# Patient Record
Sex: Female | Born: 1960 | Race: White | Hispanic: No | Marital: Married | Smoking: Current every day smoker
Health system: Southern US, Community
[De-identification: ages and names within clinical notes are randomized; demographics above are authoritative.]

## PROBLEM LIST (undated history)

## (undated) DIAGNOSIS — G4700 Insomnia, unspecified: Secondary | ICD-10-CM

## (undated) DIAGNOSIS — M8580 Other specified disorders of bone density and structure, unspecified site: Secondary | ICD-10-CM

## (undated) DIAGNOSIS — F329 Major depressive disorder, single episode, unspecified: Secondary | ICD-10-CM

## (undated) DIAGNOSIS — Q5210 Doubling of vagina, unspecified: Secondary | ICD-10-CM

## (undated) DIAGNOSIS — E079 Disorder of thyroid, unspecified: Secondary | ICD-10-CM

## (undated) DIAGNOSIS — I10 Essential (primary) hypertension: Secondary | ICD-10-CM

## (undated) DIAGNOSIS — F17200 Nicotine dependence, unspecified, uncomplicated: Secondary | ICD-10-CM

## (undated) DIAGNOSIS — F32A Depression, unspecified: Secondary | ICD-10-CM

## (undated) DIAGNOSIS — N644 Mastodynia: Secondary | ICD-10-CM

## (undated) DIAGNOSIS — N390 Urinary tract infection, site not specified: Secondary | ICD-10-CM

## (undated) DIAGNOSIS — K529 Noninfective gastroenteritis and colitis, unspecified: Secondary | ICD-10-CM

## (undated) DIAGNOSIS — D869 Sarcoidosis, unspecified: Secondary | ICD-10-CM

## (undated) DIAGNOSIS — F419 Anxiety disorder, unspecified: Secondary | ICD-10-CM

## (undated) DIAGNOSIS — Z8719 Personal history of other diseases of the digestive system: Secondary | ICD-10-CM

## (undated) DIAGNOSIS — K5792 Diverticulitis of intestine, part unspecified, without perforation or abscess without bleeding: Secondary | ICD-10-CM

## (undated) DIAGNOSIS — M1990 Unspecified osteoarthritis, unspecified site: Secondary | ICD-10-CM

## (undated) DIAGNOSIS — E785 Hyperlipidemia, unspecified: Secondary | ICD-10-CM

## (undated) HISTORY — DX: Other specified disorders of bone density and structure, unspecified site: M85.80

## (undated) HISTORY — DX: Unspecified osteoarthritis, unspecified site: M19.90

## (undated) HISTORY — DX: Major depressive disorder, single episode, unspecified: F32.9

## (undated) HISTORY — DX: Hyperlipidemia, unspecified: E78.5

## (undated) HISTORY — PX: TONSILLECTOMY: SUR1361

## (undated) HISTORY — DX: Noninfective gastroenteritis and colitis, unspecified: K52.9

## (undated) HISTORY — DX: Essential (primary) hypertension: I10

## (undated) HISTORY — DX: Depression, unspecified: F32.A

## (undated) HISTORY — DX: Doubling of vagina, unspecified: Q52.10

## (undated) HISTORY — DX: Diverticulitis of intestine, part unspecified, without perforation or abscess without bleeding: K57.92

## (undated) HISTORY — DX: Sarcoidosis, unspecified: D86.9

## (undated) HISTORY — DX: Insomnia, unspecified: G47.00

## (undated) HISTORY — DX: Anxiety disorder, unspecified: F41.9

## (undated) HISTORY — DX: Mastodynia: N64.4

## (undated) HISTORY — DX: Nicotine dependence, unspecified, uncomplicated: F17.200

## (undated) HISTORY — PX: OTHER SURGICAL HISTORY: SHX169

## (undated) SURGERY — LAPAROSCOPIC CHOLECYSTECTOMY WITH INTRAOPERATIVE CHOLANGIOGRAM
Anesthesia: General

---

## 1998-07-26 ENCOUNTER — Other Ambulatory Visit: Admission: RE | Admit: 1998-07-26 | Discharge: 1998-07-26 | Payer: Self-pay | Admitting: Obstetrics & Gynecology

## 1999-09-20 ENCOUNTER — Other Ambulatory Visit: Admission: RE | Admit: 1999-09-20 | Discharge: 1999-09-20 | Payer: Self-pay | Admitting: Obstetrics & Gynecology

## 2000-01-09 HISTORY — PX: THYROIDECTOMY: SHX17

## 2000-07-04 ENCOUNTER — Encounter: Payer: Self-pay | Admitting: Surgery

## 2000-07-05 ENCOUNTER — Encounter: Payer: Self-pay | Admitting: Surgery

## 2000-07-05 ENCOUNTER — Encounter (INDEPENDENT_AMBULATORY_CARE_PROVIDER_SITE_OTHER): Payer: Self-pay | Admitting: Specialist

## 2000-07-06 ENCOUNTER — Encounter: Payer: Self-pay | Admitting: Surgery

## 2000-07-06 ENCOUNTER — Inpatient Hospital Stay (HOSPITAL_COMMUNITY): Admission: RE | Admit: 2000-07-06 | Discharge: 2000-07-09 | Payer: Self-pay | Admitting: Surgery

## 2000-11-05 ENCOUNTER — Other Ambulatory Visit: Admission: RE | Admit: 2000-11-05 | Discharge: 2000-11-05 | Payer: Self-pay | Admitting: Gynecology

## 2001-11-17 ENCOUNTER — Other Ambulatory Visit: Admission: RE | Admit: 2001-11-17 | Discharge: 2001-11-17 | Payer: Self-pay | Admitting: Gynecology

## 2003-02-07 ENCOUNTER — Encounter: Admission: RE | Admit: 2003-02-07 | Discharge: 2003-02-07 | Payer: Self-pay | Admitting: Rheumatology

## 2003-05-12 ENCOUNTER — Other Ambulatory Visit: Admission: RE | Admit: 2003-05-12 | Discharge: 2003-05-12 | Payer: Self-pay | Admitting: Gynecology

## 2003-07-27 ENCOUNTER — Emergency Department (HOSPITAL_COMMUNITY): Admission: EM | Admit: 2003-07-27 | Discharge: 2003-07-27 | Payer: Self-pay | Admitting: Emergency Medicine

## 2004-05-12 ENCOUNTER — Other Ambulatory Visit: Admission: RE | Admit: 2004-05-12 | Discharge: 2004-05-12 | Payer: Self-pay | Admitting: Gynecology

## 2005-05-14 ENCOUNTER — Other Ambulatory Visit: Admission: RE | Admit: 2005-05-14 | Discharge: 2005-05-14 | Payer: Self-pay | Admitting: Gynecology

## 2006-09-06 ENCOUNTER — Other Ambulatory Visit: Admission: RE | Admit: 2006-09-06 | Discharge: 2006-09-06 | Payer: Self-pay | Admitting: Gynecology

## 2006-09-18 ENCOUNTER — Other Ambulatory Visit: Admission: RE | Admit: 2006-09-18 | Discharge: 2006-09-18 | Payer: Self-pay | Admitting: Gynecology

## 2006-09-20 ENCOUNTER — Encounter: Admission: RE | Admit: 2006-09-20 | Discharge: 2006-09-20 | Payer: Self-pay | Admitting: Internal Medicine

## 2007-02-13 ENCOUNTER — Encounter: Payer: Self-pay | Admitting: Gynecology

## 2007-02-13 ENCOUNTER — Ambulatory Visit (HOSPITAL_BASED_OUTPATIENT_CLINIC_OR_DEPARTMENT_OTHER): Admission: RE | Admit: 2007-02-13 | Discharge: 2007-02-13 | Payer: Self-pay | Admitting: Gynecology

## 2007-02-13 DIAGNOSIS — IMO0001 Reserved for inherently not codable concepts without codable children: Secondary | ICD-10-CM | POA: Insufficient documentation

## 2007-02-13 HISTORY — PX: HYSTEROSCOPY: SHX211

## 2007-06-24 ENCOUNTER — Ambulatory Visit: Payer: Self-pay | Admitting: Internal Medicine

## 2007-06-24 DIAGNOSIS — I10 Essential (primary) hypertension: Secondary | ICD-10-CM | POA: Insufficient documentation

## 2007-06-24 DIAGNOSIS — M545 Low back pain, unspecified: Secondary | ICD-10-CM | POA: Insufficient documentation

## 2007-06-24 DIAGNOSIS — D860 Sarcoidosis of lung: Secondary | ICD-10-CM | POA: Insufficient documentation

## 2007-06-24 DIAGNOSIS — E039 Hypothyroidism, unspecified: Secondary | ICD-10-CM | POA: Insufficient documentation

## 2007-06-24 DIAGNOSIS — F329 Major depressive disorder, single episode, unspecified: Secondary | ICD-10-CM | POA: Insufficient documentation

## 2007-07-31 ENCOUNTER — Ambulatory Visit: Payer: Self-pay | Admitting: Internal Medicine

## 2007-07-31 DIAGNOSIS — K130 Diseases of lips: Secondary | ICD-10-CM | POA: Insufficient documentation

## 2007-08-19 ENCOUNTER — Ambulatory Visit: Payer: Self-pay | Admitting: Family Medicine

## 2007-08-19 DIAGNOSIS — T6391XA Toxic effect of contact with unspecified venomous animal, accidental (unintentional), initial encounter: Secondary | ICD-10-CM | POA: Insufficient documentation

## 2007-09-05 ENCOUNTER — Ambulatory Visit: Payer: Self-pay | Admitting: Internal Medicine

## 2007-09-05 DIAGNOSIS — R51 Headache: Secondary | ICD-10-CM | POA: Insufficient documentation

## 2007-09-05 DIAGNOSIS — R519 Headache, unspecified: Secondary | ICD-10-CM | POA: Insufficient documentation

## 2007-11-07 ENCOUNTER — Ambulatory Visit: Payer: Self-pay | Admitting: Internal Medicine

## 2007-11-07 DIAGNOSIS — F411 Generalized anxiety disorder: Secondary | ICD-10-CM | POA: Insufficient documentation

## 2007-12-01 ENCOUNTER — Ambulatory Visit: Payer: Self-pay | Admitting: Women's Health

## 2008-01-16 ENCOUNTER — Telehealth: Payer: Self-pay | Admitting: Internal Medicine

## 2008-01-19 ENCOUNTER — Other Ambulatory Visit: Admission: RE | Admit: 2008-01-19 | Discharge: 2008-01-19 | Payer: Self-pay | Admitting: Gynecology

## 2008-01-19 ENCOUNTER — Encounter: Payer: Self-pay | Admitting: Internal Medicine

## 2008-01-19 ENCOUNTER — Ambulatory Visit: Payer: Self-pay | Admitting: Women's Health

## 2008-01-19 ENCOUNTER — Encounter: Payer: Self-pay | Admitting: Women's Health

## 2008-02-19 ENCOUNTER — Ambulatory Visit: Payer: Self-pay | Admitting: Internal Medicine

## 2008-02-19 DIAGNOSIS — D869 Sarcoidosis, unspecified: Secondary | ICD-10-CM | POA: Insufficient documentation

## 2008-02-19 DIAGNOSIS — J029 Acute pharyngitis, unspecified: Secondary | ICD-10-CM | POA: Insufficient documentation

## 2008-02-19 DIAGNOSIS — R079 Chest pain, unspecified: Secondary | ICD-10-CM | POA: Insufficient documentation

## 2008-02-19 DIAGNOSIS — R5383 Other fatigue: Secondary | ICD-10-CM

## 2008-02-19 DIAGNOSIS — R5381 Other malaise: Secondary | ICD-10-CM | POA: Insufficient documentation

## 2008-02-19 LAB — CONVERTED CEMR LAB: TSH: 3.89 microintl units/mL (ref 0.35–5.50)

## 2008-02-27 ENCOUNTER — Encounter: Payer: Self-pay | Admitting: Internal Medicine

## 2008-06-08 ENCOUNTER — Ambulatory Visit: Payer: Self-pay | Admitting: Internal Medicine

## 2008-06-08 LAB — CONVERTED CEMR LAB
ALT: 19 units/L (ref 0–35)
Albumin: 3.9 g/dL (ref 3.5–5.2)
Basophils Relative: 0.5 % (ref 0.0–3.0)
Bilirubin, Direct: 0.1 mg/dL (ref 0.0–0.3)
Blood in Urine, dipstick: NEGATIVE
Eosinophils Absolute: 0.2 10*3/uL (ref 0.0–0.7)
Glucose, Bld: 105 mg/dL — ABNORMAL HIGH (ref 70–99)
Glucose, Urine, Semiquant: NEGATIVE
HCT: 39.8 % (ref 36.0–46.0)
HDL: 62 mg/dL (ref >39.00)
Lymphocytes Relative: 32.3 % (ref 12.0–46.0)
Lymphs Abs: 2.3 10*3/uL (ref 0.7–4.0)
Monocytes Absolute: 0.4 10*3/uL (ref 0.1–1.0)
Monocytes Relative: 5.2 % (ref 3.0–12.0)
Neutro Abs: 4.1 10*3/uL (ref 1.4–7.7)
Neutrophils Relative %: 59.8 % (ref 43.0–77.0)
Nitrite: NEGATIVE
Platelets: 196 10*3/uL (ref 150.0–400.0)
Potassium: 4.3 meq/L (ref 3.5–5.1)
Protein, U semiquant: NEGATIVE
RDW: 12 % (ref 11.5–14.6)
Sodium: 142 meq/L (ref 135–145)
Specific Gravity, Urine: 1.02
Total Bilirubin: 1.1 mg/dL (ref 0.3–1.2)
Total CHOL/HDL Ratio: 3
Total Protein: 6.6 g/dL (ref 6.0–8.3)
Triglycerides: 65 mg/dL (ref 0.0–149.0)
Urobilinogen, UA: 0.2
WBC Urine, dipstick: NEGATIVE

## 2008-06-09 ENCOUNTER — Encounter: Payer: Self-pay | Admitting: Internal Medicine

## 2008-06-15 ENCOUNTER — Ambulatory Visit: Payer: Self-pay | Admitting: Internal Medicine

## 2008-07-01 ENCOUNTER — Ambulatory Visit: Payer: Self-pay | Admitting: Internal Medicine

## 2008-07-01 DIAGNOSIS — G5600 Carpal tunnel syndrome, unspecified upper limb: Secondary | ICD-10-CM | POA: Insufficient documentation

## 2008-07-07 ENCOUNTER — Encounter: Payer: Self-pay | Admitting: Internal Medicine

## 2008-07-07 ENCOUNTER — Telehealth: Payer: Self-pay | Admitting: Internal Medicine

## 2008-09-01 ENCOUNTER — Ambulatory Visit: Payer: Self-pay | Admitting: Women's Health

## 2008-09-29 ENCOUNTER — Encounter: Admission: RE | Admit: 2008-09-29 | Discharge: 2008-09-29 | Payer: Self-pay | Admitting: Internal Medicine

## 2009-02-11 ENCOUNTER — Other Ambulatory Visit: Admission: RE | Admit: 2009-02-11 | Discharge: 2009-02-11 | Payer: Self-pay | Admitting: Gynecology

## 2009-02-11 ENCOUNTER — Ambulatory Visit: Payer: Self-pay | Admitting: Women's Health

## 2009-05-06 IMAGING — CR DG CHEST 2V
2 series · 2 of 2 positions shown · non-contrast
Comparison: CT 09/20/2006

CLINICAL DATA: Sarcoidosis, chest wall pain.

CHEST - 2 VIEW

[view not recorded (1 of 2)]
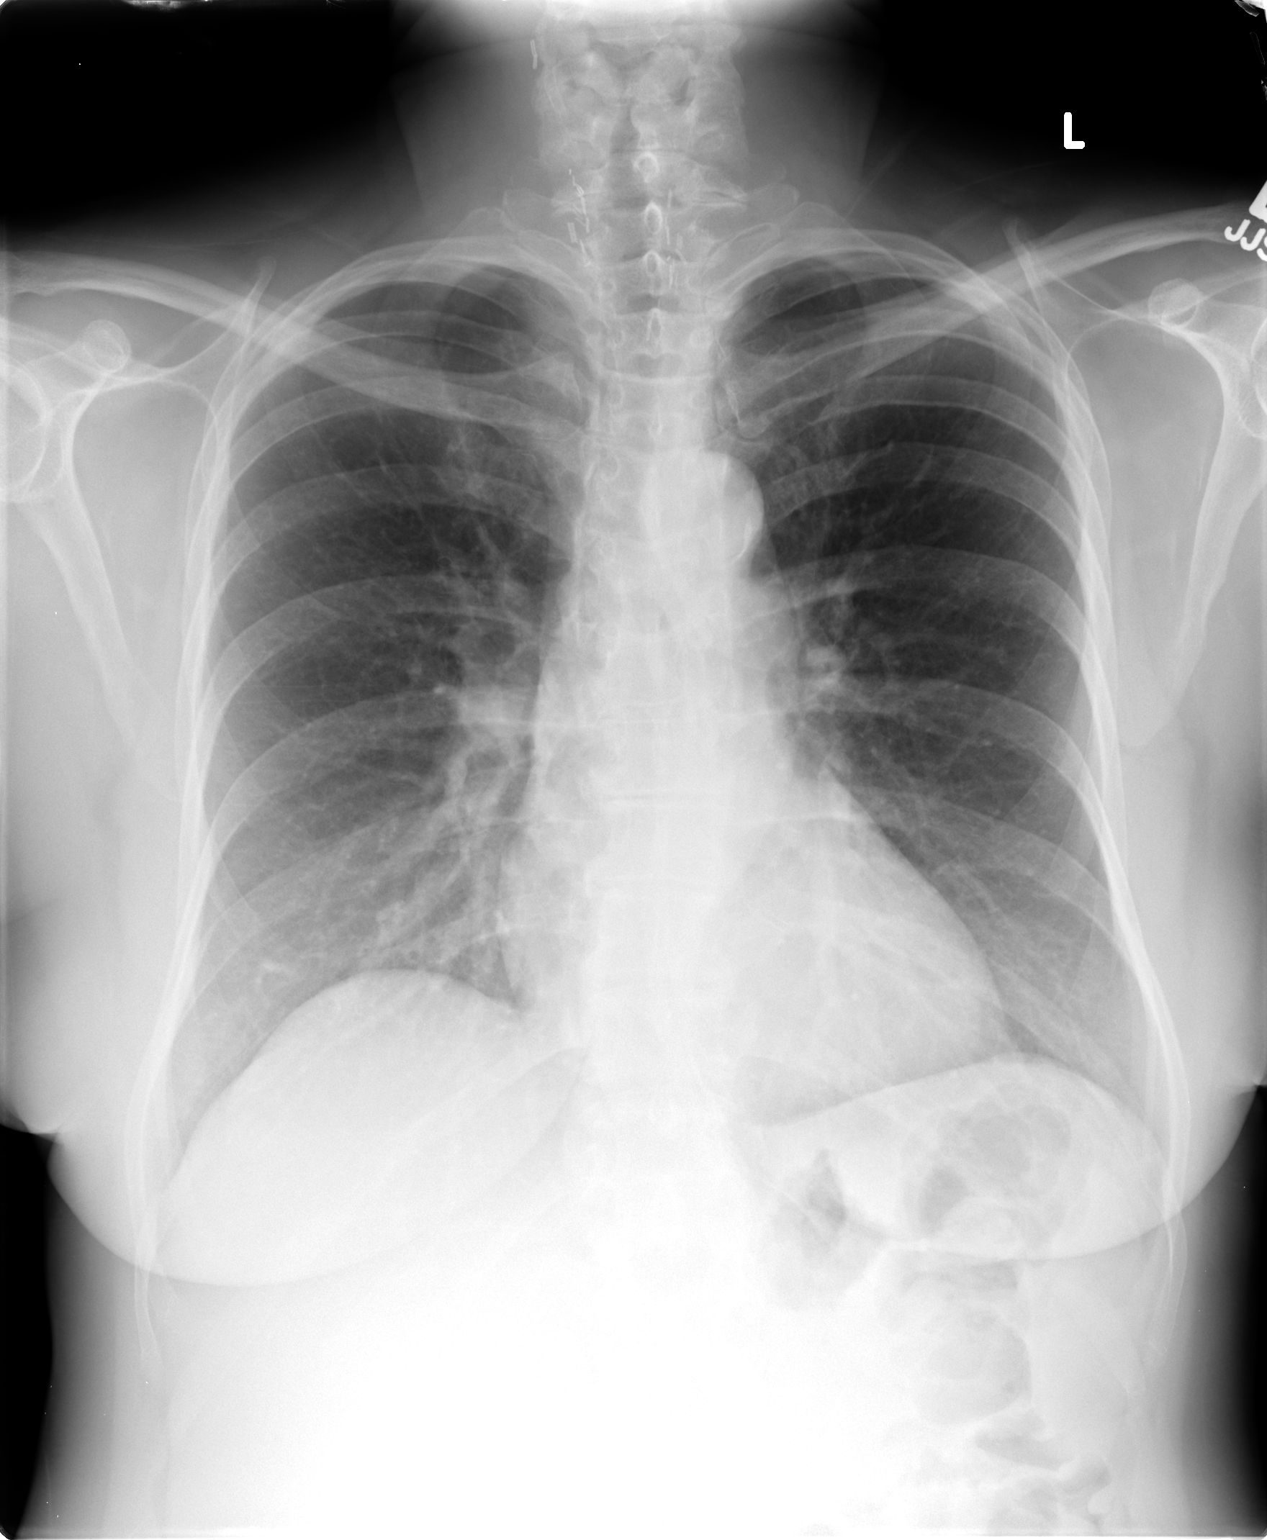

[view not recorded (2 of 2)]
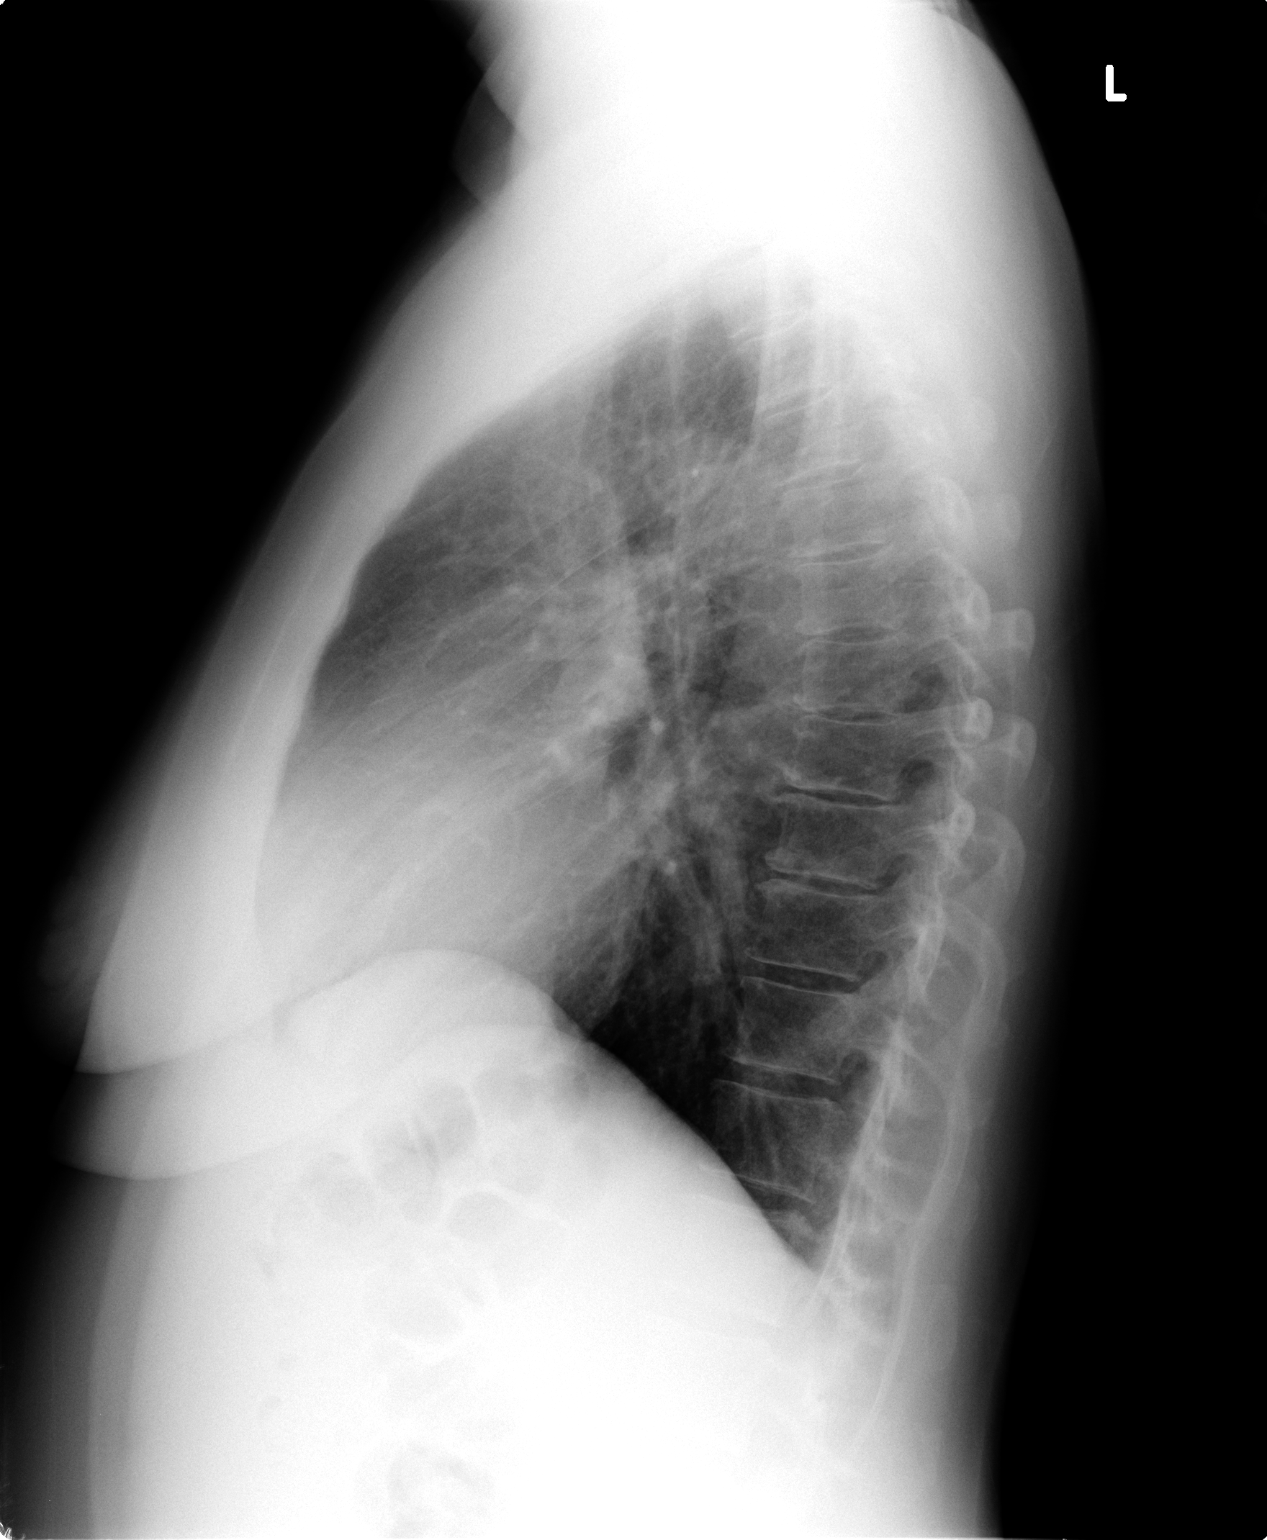

[2 of 2 positions shown; findings below may reference images not displayed]

FINDINGS: Heart is normal size.  Mild hyperinflation of the lungs.
No focal opacities or effusions.  Mild degenerative changes in the
thoracic spine.
IMPRESSION: Mild hyperinflation.  No acute findings.

REF:W2 DICTATED: 02/19/2008 [DATE]

## 2009-07-09 ENCOUNTER — Emergency Department (HOSPITAL_COMMUNITY): Admission: EM | Admit: 2009-07-09 | Discharge: 2009-07-10 | Payer: Self-pay | Admitting: Emergency Medicine

## 2009-07-09 ENCOUNTER — Ambulatory Visit: Payer: Self-pay | Admitting: Psychiatry

## 2009-07-10 ENCOUNTER — Inpatient Hospital Stay (HOSPITAL_COMMUNITY): Admission: AD | Admit: 2009-07-10 | Discharge: 2009-07-10 | Payer: Self-pay | Admitting: Psychiatry

## 2009-11-17 ENCOUNTER — Ambulatory Visit: Payer: Self-pay | Admitting: Women's Health

## 2010-01-28 ENCOUNTER — Encounter: Payer: Self-pay | Admitting: Rheumatology

## 2010-03-26 LAB — DIFFERENTIAL
Eosinophils Absolute: 0.1 10*3/uL (ref 0.0–0.7)
Eosinophils Relative: 1 % (ref 0–5)
Lymphs Abs: 2.6 10*3/uL (ref 0.7–4.0)
Monocytes Relative: 3 % (ref 3–12)
Neutro Abs: 6.1 10*3/uL (ref 1.7–7.7)

## 2010-03-26 LAB — CBC
MCH: 31.7 pg (ref 26.0–34.0)
MCHC: 34.9 g/dL (ref 30.0–36.0)
RBC: 4.45 MIL/uL (ref 3.87–5.11)
RDW: 12.3 % (ref 11.5–15.5)
WBC: 9.1 10*3/uL (ref 4.0–10.5)

## 2010-03-26 LAB — RAPID URINE DRUG SCREEN, HOSP PERFORMED
Barbiturates: NOT DETECTED
Benzodiazepines: POSITIVE — AB

## 2010-03-26 LAB — COMPREHENSIVE METABOLIC PANEL
ALT: 18 U/L (ref 0–35)
Alkaline Phosphatase: 48 U/L (ref 39–117)
BUN: 14 mg/dL (ref 6–23)
Chloride: 105 mEq/L (ref 96–112)
Creatinine, Ser: 0.72 mg/dL (ref 0.4–1.2)
GFR calc Af Amer: >60 mL/min (ref >60)
Glucose, Bld: 95 mg/dL (ref 70–99)
Total Bilirubin: 0.4 mg/dL (ref 0.3–1.2)
Total Protein: 6.9 g/dL (ref 6.0–8.3)

## 2010-03-26 LAB — ETHANOL: Alcohol, Ethyl (B): <5 mg/dL (ref 0–10)

## 2010-03-26 LAB — SALICYLATE LEVEL: Salicylate Lvl: <4 mg/dL (ref 2.8–20.0)

## 2010-03-26 LAB — POCT PREGNANCY, URINE: Preg Test, Ur: NEGATIVE

## 2010-03-26 LAB — ACETAMINOPHEN LEVEL: Acetaminophen (Tylenol), Serum: <10 ug/mL — ABNORMAL LOW (ref 10–30)

## 2010-04-24 ENCOUNTER — Encounter: Payer: Self-pay | Admitting: Women's Health

## 2010-05-09 HISTORY — PX: REPLACEMENT TOTAL KNEE: SUR1224

## 2010-05-12 ENCOUNTER — Other Ambulatory Visit (HOSPITAL_COMMUNITY): Payer: Self-pay | Admitting: Orthopedic Surgery

## 2010-05-12 ENCOUNTER — Encounter (HOSPITAL_COMMUNITY): Payer: BC Managed Care – PPO

## 2010-05-12 ENCOUNTER — Other Ambulatory Visit: Payer: Self-pay | Admitting: Orthopedic Surgery

## 2010-05-12 ENCOUNTER — Ambulatory Visit (HOSPITAL_COMMUNITY)
Admission: RE | Admit: 2010-05-12 | Discharge: 2010-05-12 | Disposition: A | Discharge status: Home / Self Care | Payer: BC Managed Care – PPO | Source: Ambulatory Visit | Attending: Orthopedic Surgery | Admitting: Orthopedic Surgery

## 2010-05-12 DIAGNOSIS — Z01811 Encounter for preprocedural respiratory examination: Secondary | ICD-10-CM | POA: Insufficient documentation

## 2010-05-12 DIAGNOSIS — Z01810 Encounter for preprocedural cardiovascular examination: Secondary | ICD-10-CM

## 2010-05-12 DIAGNOSIS — Z01812 Encounter for preprocedural laboratory examination: Secondary | ICD-10-CM | POA: Insufficient documentation

## 2010-05-12 DIAGNOSIS — Z01818 Encounter for other preprocedural examination: Secondary | ICD-10-CM | POA: Insufficient documentation

## 2010-05-12 DIAGNOSIS — F17200 Nicotine dependence, unspecified, uncomplicated: Secondary | ICD-10-CM | POA: Insufficient documentation

## 2010-05-12 DIAGNOSIS — I10 Essential (primary) hypertension: Secondary | ICD-10-CM | POA: Insufficient documentation

## 2010-05-12 LAB — CBC
Hemoglobin: 13.5 g/dL (ref 12.0–15.0)
MCV: 89.2 fL (ref 78.0–100.0)
Platelets: 268 10*3/uL (ref 150–400)
RBC: 4.53 MIL/uL (ref 3.87–5.11)
WBC: 10.9 10*3/uL — ABNORMAL HIGH (ref 4.0–10.5)

## 2010-05-12 LAB — SURGICAL PCR SCREEN: MRSA, PCR: NEGATIVE

## 2010-05-12 LAB — COMPREHENSIVE METABOLIC PANEL
ALT: 16 U/L (ref 0–35)
AST: 18 U/L (ref 0–37)
Calcium: 9.8 mg/dL (ref 8.4–10.5)
Glucose, Bld: 83 mg/dL (ref 70–99)
Potassium: 3.8 mEq/L (ref 3.5–5.1)
Total Protein: 7.1 g/dL (ref 6.0–8.3)

## 2010-05-12 LAB — URINALYSIS, ROUTINE W REFLEX MICROSCOPIC
Glucose, UA: NEGATIVE mg/dL
Ketones, ur: NEGATIVE mg/dL
Nitrite: NEGATIVE
Protein, ur: NEGATIVE mg/dL
Urobilinogen, UA: 0.2 mg/dL (ref 0.0–1.0)
pH: 7.5 (ref 5.0–8.0)

## 2010-05-12 LAB — APTT: aPTT: 39 seconds — ABNORMAL HIGH (ref 24–37)

## 2010-05-19 ENCOUNTER — Inpatient Hospital Stay (HOSPITAL_COMMUNITY)
Admission: RE | Admit: 2010-05-19 | Discharge: 2010-05-22 | DRG: 209 | Disposition: A | Discharge status: Home Health | Payer: BC Managed Care – PPO | Source: Ambulatory Visit | Attending: Orthopedic Surgery | Admitting: Orthopedic Surgery

## 2010-05-19 DIAGNOSIS — M1710 Unilateral primary osteoarthritis, unspecified knee: Principal | ICD-10-CM | POA: Diagnosis present

## 2010-05-19 DIAGNOSIS — E871 Hypo-osmolality and hyponatremia: Secondary | ICD-10-CM | POA: Diagnosis not present

## 2010-05-19 DIAGNOSIS — I10 Essential (primary) hypertension: Secondary | ICD-10-CM | POA: Diagnosis present

## 2010-05-19 DIAGNOSIS — F17200 Nicotine dependence, unspecified, uncomplicated: Secondary | ICD-10-CM | POA: Diagnosis present

## 2010-05-19 DIAGNOSIS — E039 Hypothyroidism, unspecified: Secondary | ICD-10-CM | POA: Diagnosis present

## 2010-05-19 DIAGNOSIS — Z01812 Encounter for preprocedural laboratory examination: Secondary | ICD-10-CM

## 2010-05-19 LAB — TYPE AND SCREEN: ABO/RH(D): O NEG

## 2010-05-20 LAB — CBC
Hemoglobin: 10.4 g/dL — ABNORMAL LOW (ref 12.0–15.0)
MCH: 30 pg (ref 26.0–34.0)
MCHC: 32.9 g/dL (ref 30.0–36.0)
Platelets: 205 10*3/uL (ref 150–400)
RBC: 3.47 MIL/uL — ABNORMAL LOW (ref 3.87–5.11)
RDW: 12.8 % (ref 11.5–15.5)
WBC: 13.1 10*3/uL — ABNORMAL HIGH (ref 4.0–10.5)

## 2010-05-20 LAB — BASIC METABOLIC PANEL
Creatinine, Ser: 0.63 mg/dL (ref 0.4–1.2)
Potassium: 3.7 mEq/L (ref 3.5–5.1)

## 2010-05-21 LAB — BASIC METABOLIC PANEL
CO2: 27 mEq/L (ref 19–32)
Calcium: 8.4 mg/dL (ref 8.4–10.5)
Chloride: 92 mEq/L — ABNORMAL LOW (ref 96–112)
Glucose, Bld: 123 mg/dL — ABNORMAL HIGH (ref 70–99)
Potassium: 3.3 mEq/L — ABNORMAL LOW (ref 3.5–5.1)
Sodium: 128 mEq/L — ABNORMAL LOW (ref 135–145)

## 2010-05-21 LAB — CBC
HCT: 28.9 % — ABNORMAL LOW (ref 36.0–46.0)
MCHC: 34.3 g/dL (ref 30.0–36.0)
MCV: 87 fL (ref 78.0–100.0)
Platelets: 194 10*3/uL (ref 150–400)
RDW: 12.2 % (ref 11.5–15.5)

## 2010-05-22 LAB — BASIC METABOLIC PANEL
CO2: 30 mEq/L (ref 19–32)
Calcium: 8.5 mg/dL (ref 8.4–10.5)
Glucose, Bld: 131 mg/dL — ABNORMAL HIGH (ref 70–99)
Sodium: 131 mEq/L — ABNORMAL LOW (ref 135–145)

## 2010-05-22 LAB — CBC
HCT: 26.8 % — ABNORMAL LOW (ref 36.0–46.0)
Hemoglobin: 9.3 g/dL — ABNORMAL LOW (ref 12.0–15.0)
MCHC: 34.7 g/dL (ref 30.0–36.0)

## 2010-05-23 NOTE — H&P (Signed)
NAME:  Latoya Clark, Latoya Clark              ACCOUNT NO.:  [id number]   MEDICAL RECORD NO.:  [id number]          PATIENT TYPE:  AMB   LOCATION:  [facility]                         FACILITY:  [facility]   PHYSICIAN:  Juan H. Lily Peer, M.D.DATE OF BIRTH:  [birth year]   DATE OF ADMISSION:  DATE OF DISCHARGE:                              HISTORY & PHYSICAL   The patient scheduled for surgery on Thursday, February 5, at 7:30 a.m.  in [facility], please have history and physical  available.   CHIEF COMPLAINT:  1. Dysfunctional uterine bleeding.  2. Septated vagina.   HISTORY:  The patient is a 50 year old gravida 2, para 2 who had been on  Depo-Provera in an effort to regulating her cycles, although she has not  been sexually active over 10 years.  Had discontinued Depo-Provera in  the last year and has complained of dysfunctional uterine bleeding.  Her  recent Pap smear in the office had been normal.  When an attempt in the  office to do an endometrial biopsy was unsuccessful due to the patient's  discomfort in vaginal septum, the cervix appeared to be close to the  vaginal septum and made it difficult to try to slide or place a catheter  to do hysterosalpingogram or an endometrial biopsy.  For this reason,  the patient is being taken to the operating room to undergo hysteroscopy  and D and C.  She had an ultrasound on January 9 where there was no  uterine abnormality seen, slight vascularity seen in the endometrium.  No definitive polyps were seen, although a sonohysterogram was not done  because of the above-mentioned reasons.  Her ovaries appeared to be  normal.  Her endometrial stripe was thickened at 12.1 mm.  The patient  has been on Megace 40 mg t.i.d. in effort to stop her bleeding until her  surgery.  She was seen for preoperative consultation on February 3, and  all questions were  answered for her planned procedure.  +   ALLERGIES:  The patient denies any allergies with the  exception of  nausea from MORPHINE.   PAST MEDICAL HISTORY:  She suffers from __________dynia and has a  vaginal septum.  Also, she has history of hypertension, arthritis,  smokes one pack of cigarette per day, history of depression, and history  of sarcoidosis.   MEDICATIONS:  Consist of  the Megace 40 mg t.i.d. that she was recently  placed on.  She is on Synthroid 125 mcg daily and she takes Cymbalta 1  daily and Benicar.   PRIOR SURGERIES:  Include C-section in 1981 and 1984, thyroidectomy in  2002, and she has history of COPD.   FAMILY HISTORY:  Father, aunts, and uncles with diabetes and  grandfather, with cardiovascular disease and grandmother,  stomach  cancer.   PHYSICAL EXAMINATION:  VITAL SIGNS:  The patient weights 191 pounds.  She is 5 feet 3-1/8 inches tall.  HEENT:  Unremarkable.  NECK:  Supple.  Trachea midline.  No carotid bruits.  No thyromegaly.  LUNGS:  Clear to auscultation without rhonchus or wheezes.  HEART:  Regular  rate and rhythm.  No murmurs or gallop.  BREASTS:  Not done.  ABDOMEN:  Soft and nontender without rebound or guarding.  PELVIC:  Bartholin, urethral, and Skene glands were within normal  limits.  Vagina with a vaginal septum noted.  From mid portion of vagina  to the back of the vagina has a blind pouchon the patient's right and on  the left, this is where the cervix was palpated.  Bimanual examination:  Uterus is anteverted, normal in size, shape, and consistency.  Adnexa is  without any masses or tenderness.  RECTAL:  Deferred.   ASSESSMENT:  A 50 year old gravida 2, para 2, not sexually active with  dysfunctional uterine bleeding, thickened endometrium, and with a  vaginal septum and difficult to do an office endometrial biopsy is  scheduled to undergo hysteroscopy  and dilatation and curettage as an  outpatient [facility].  We had also discussed this as  patient is 50 years of age and with her history of her  smoking  that she  would not be a candidate for hormone such as oral contraceptive pill to  regulate her cycle and that perhaps at the same time after the  hysteroscopy and dilatation and curettage, we may proceed with placing  Mirena IUD to help regulate her cycles.  The patient agrees and accepts.  Risks, benefits, and pros and cons were discussed.  All questions were  answered.  We will follow accordingly.   PLAN:  The patient is scheduled for hysteroscopy and D and C on  Thursday, February 5, at 7:30 a.m. in [facility] and  also placement of Mirena IUD as well.      Juan H. Lily Peer, M.D.  Electronically Signed     JHF/MEDQ  D:  02/12/2007  T:  02/12/2007  Job:  [id number]

## 2010-05-23 NOTE — Op Note (Signed)
NAME:  Latoya Clark, Latoya Clark              ACCOUNT NO.:  [id number]   MEDICAL RECORD NO.:  [id number]          PATIENT TYPE:  AMB   LOCATION:  [facility]                         FACILITY:  [facility]   PHYSICIAN:  Juan H. Lily Peer, M.D.DATE OF BIRTH:  Dec 26, 1960   DATE OF PROCEDURE:  02/13/2007  DATE OF DISCHARGE:                               OPERATIVE REPORT   INDICATION FOR OPERATION:  A 50 year old gravida 2 and para 2 with  dysfunctional uterine bleeding.  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  Due to the location of the cervix close to the septum made it  difficult in the office to proceed with endometrial biopsy and  sonohysterogram as per evaluation for dysfunctional uterine bleeding.  Due to the patient's smoking history, the plan was to proceed with a  diagnostic hysteroscopy D&C and placement of Mirena IUD.   PREOPERATIVE DIAGNOSES:  1. Dysfunctional uterine bleeding.  2. Septated vagina.   POSTOPERATIVE DIAGNOSES:  1. Dysfunctional uterine bleeding.  2. Septated vagina.  3. Endometrial polyps.   ANESTHESIA:  General endotracheal anesthesia.   PROCEDURES PERFORMED:  1. Diagnostic hysteroscopy.  2. Dilatation and curettage as well as suction curettage.  3. Placement of Mirena intrauterine device.   FINDINGS:  Normal cervical canal and lush endometrium with scattered  small polyps.  Both tubal ostia identified.   DESCRIPTION OF OPERATION:  After the patient was adequately counseled,  she was taken to the operating room where she underwent a successful  general endotracheal anesthesia, and she was placed on high lithotomy  position.  The vagina and perineum were prepped and draped in usual  sterile fashion.  This patient had previously voided, so no urine was  found at the time of in-and-out catheterization.  A speculum was placed  in the vagina into the left vaginal compartment, where the cervix  was  moving the septum to the patient's right to visualize the cervix.  A  single-tooth tenaculum was placed to bring the cervical os into view.  The uterus sounded to approximately 8.5 cm.  The diagnostic as well as  the operative resectoscope had been introduced into the intrauterine  cavity after the cervix had been dilated with Shawnie Pons dilators.  Three  percent sorbitol was used as a staining media.  The patient was found to  have lush endometrium.  The right and left tubal ostia were identified.  A blunt curettage was undertaken along with the suction curette to  completely clean the entire uterine cavity.  The scope was reinserted  once again and clear endometrium was noted at that time.  All the  tissues had been removed and submitted for histological evaluation.  At  this point, the Mirena IUD was then placed.  The single-tooth tenaculum  was removed.  The  speculum was removed.  The patient was extubated and transferred to  recovery room with stable vital signs.  Blood loss was minimal.  The  patient was to receive Toradol 30 mg IV en route to the recovery room  and  IV fluids resuscitation was 900 mL of Lactated Ringers.      Juan H. Lily Peer, M.D.  Electronically Signed     JHF/MEDQ  D:  02/13/2007  T:  02/13/2007  Job:  [id number]

## 2010-05-24 NOTE — Op Note (Signed)
NAME:  Latoya Clark, Latoya Clark NO.:  [id number]  MEDICAL RECORD NO.:  [id number]           PATIENT TYPE:  I  LOCATION:  1604                         FACILITY:  [facility]  PHYSICIAN:  Ollen Gross, M.D.    DATE OF BIRTH:  1960-07-18  DATE OF PROCEDURE:  05/19/2010 DATE OF DISCHARGE:                              OPERATIVE REPORT   PREOPERATIVE DIAGNOSIS:  Osteoarthritis, right knee.  POSTOPERATIVE DIAGNOSIS:  Osteoarthritis, right knee.  PROCEDURE:  Right total knee arthroplasty.  SURGEON:  Ollen Gross, M.D.  ASSISTANT:  Alexzandrew L. Perkins, P.A.C.  ANESTHESIA:  Spinal.  ESTIMATED BLOOD LOSS:  Minimal.  DRAINS:  Hemovac x1.  TOURNIQUET TIME:  37 minutes at 300 mmHg.  COMPLICATIONS:  None.  CONDITION.:  Stable to recovery.  BRIEF CLINICAL NOTE:  Latoya Clark is a 50 year old female with advanced end- stage arthritis of the right knee with progressively worsening pain and dysfunction.  She has failed nonoperative management and presents for total knee arthroplasty.  PROCEDURE IN DETAIL:  After successful administration of spinal anesthetic, a tourniquet was placed high on her right thigh and her right lower extremity was prepped and draped in the usual sterile fashion.  Extremity was wrapped in Esmarch, knee flexed, tourniquet inflated to 300 mmHg.  Midline incision was made with a #10 blade through subcutaneous tissue to the level of the extensor mechanism.  A fresh blade was used make a medial parapatellar arthrotomy.  Soft tissue on the proximal medial tibia was subperiosteally elevated to the joint line with a knife and into the semimembranosus bursa with a Cobb elevator.  Soft tissue laterally was elevated with attention being paid to avoiding patellar tendon on tibial tubercle.  Patella was everted, knee flexed 90 degrees, and ACL and PCL removed.  Drill was used to create a starting hole in the distal femur and the canal was thoroughly irrigated.  The  5-degree right valgus alignment guide was placed and distal femoral cutting block pinned to remove 11 mm off the distal femur.  Resection was made with an oscillating saw.  The tibia subluxed forward and the menisci removed.  Extramedullary tibial alignment guide was placed referencing proximally at the medial aspect of the tibial tubercle and distally along the second metatarsal axis and tibial crest.  Block was pinned to remove 2 mm off the more deficient medial side.  Tibial resection was made with an oscillating saw.  Size 2 was the most appropriate tibial component and proximal tibia was prepared with a modular drill and keel punch for the size 2. Femoral preparation was initiated with the sizing block.  Size 2.5 was the most appropriate femoral component.  Rotation was marked at the epicondylar axis and confirmed by creating rectangular flexion gap at 90 degrees.  The block was pinned in this rotation.  The anterior-posterior chamfer cuts are made.  Intercondylar block was placed and that cut was made.  Trial size 2.5 posterior stabilized femur was placed.  A 10-mm posterior stabilized rotating platform insert trial was placed.  With the 10, there was a tiny bit of varus-valgus and anterior-posterior play, so went to 12.5 which  allowed for full extension with excellent varus-valgus and anterior-posterior balance throughout full range of motion.  Patella was everted, thickness measured to be 22 mm.  Freehand resection was taken to 13 mm, 32 template was placed, lug holes were drilled, trial patella was placed and it tracked normally.  Osteophytes were removed off the posterior femur with the trial in place.  All trials were removed and the cut bone surfaces prepared with pulsatile lavage.  Cement was mixed and once ready for implantation a size 2 mobile bearing tibial tray, size 2.5 posterior stabilized femur, and 32 patella were cemented into place.  Patella was held with a clamp.   Trial 12.5-mm insert was placed, knee held in full extension and all extruded cement removed.  When cement fully hardened, then the permanent 12-mm rotating platform posterior stabilized insert was placed into the tibial tray.  Wound was copiously irrigated with saline solution and the arthrotomy closed over Hemovac drain with interrupted #1 PDS.  Flexion against gravity is 135 degrees and the patella tracks normally. Tourniquet was released after total time of 37 minutes.  Subcu was closed with interrupted 2-0 Vicryl and subcuticular running 4-0 Monocryl.  Catheter for Marcaine pain pump was placed and pump initiated.  Incisions cleaned and dried and Steri-Strips and bulky sterile dressing applied.  Hemovac hooked to suction and she was then placed into a knee immobilizer, awakened and transported to recovery in stable condition.     Ollen Gross, M.D.     FA/MEDQ  D:  05/19/2010  T:  05/19/2010  Job:  [id number]  Electronically Signed by Ollen Gross M.D. on 05/24/2010 10:13:29 AM

## 2010-05-26 NOTE — Op Note (Signed)
[facility]  Patient:    Latoya Clark, Latoya Clark                     MRN: [id number] Proc. Date: 07/05/00 Adm. Date:  [id number] Attending:  Bonnetta Barry CC:         Fritzi Mandes, M.D.   Operative Report  PREOPERATIVE DIAGNOSIS:  Acute bleeding at site of total thyroidectomy.  POSTOPERATIVE DIAGNOSIS:  Acute bleed cervical wound from right superior thyroid artery.  PROCEDURE PERFORMED:  Explore of cervical wound and ligate right superior thyroid artery.  SURGEON:  Velora Heckler, M.D.  ANESTHESIA:  General.  ANESTHESIOLOGIST:  Mack Guise, M.D.  ESTIMATED BLOOD LOSS:  Approximately 2000 cc total blood loss.  COMPLICATIONS:  None.  DISPOSITION:  To the intensive care unit postoperatively.  INDICATIONS:  The patient is a 50 year old white female who underwent a total thyroidectomy for a multinodular goiter with compressive symptoms earlier today.  The patient was stable in the recovery room.  She was stable on the floor.  Her sister was present at the time when the patient went to the bathroom.  She had an episode of coughing.  The patient felt a change in her neck and returned to bed.  She had a decrease level of consciousness.  She developed respiratory distress and near respiratory arrest.  She was attended by the nursing staff with placement of oxygen, and an attempt at ventilation with an Ambu bag.  I was called to the bedside stat by overhead page.  I inserted an oral airway, but still could not ventilate with the ambu bag.  I removed the staples and Steri-Strips from the surgical wound and using scissors, removed the suture material from the layered closure of the neck down to the level of the trachea.  This released a moderate amount of blood and thrombus under pressure.  The CRNA from the operating room was present and using an ambu bag, the patient was able to ventilate spontaneously.  She followed commands.  She was transferred  immediately to the operating room where she was again placed under general anesthesia via endotracheal intubation.  The surgical wound was then explored.  DESCRIPTION OF PROCEDURE:  The patient was brought urgently to the operating room and placed in room #1 at [facility].  After successful induction of general endotracheal anesthetic by Dr. Mack Guise, the patient was prepped and draped in the usual strict aseptic fashion.  The cervical wound was explored.  All suture material was removed.  Blood and thrombus was evacuated.  The neck was irrigated copiously with warm saline. Upon immediate inspection, there was no further acute bleeding, however, after irrigation and manipulation of the neck, there was an acute arterial bleed from the superior thyroid artery on the right side.  The artery was successfully controlled and was doubly ligated with medium liga clips.  Both sides of the neck were irrigated and then packed.  The neck was further explored and further irrigated for approximately one hour.  No further significant bleeding was identified.  Surgicel was placed over the area of the recurrent laryngeal nerves bilaterally.  Jackson-Pratt drains 7 mm were placed in the deep space adjacent to the trachea and in the subplatysmal space. These were brought out through lateral stab wounds and secured to the skin with 3-0 nylon sutures.  Strap muscles were then reapproximated in the midline of interrupted 3-0 Vicryl sutures.  The platysma was reapproximated with interrupted  3-0 Vicryl sutures.  The skin was closed with stainless staples. Dry dressings were placed.  The patient was then transported intubated to the intensive care unit where she will be monitored overnight. DD:  07/05/00 TD:  07/06/00 Job: [id number]

## 2010-05-26 NOTE — Op Note (Signed)
[facility]  Patient:    Latoya Clark, Latoya Clark                     MRN: [id number] Proc. Date: 07/05/00 Adm. Date:  [id number] Attending:  Bonnetta Barry CC:         Fritzi Mandes, M.D.  Tomi Bamberger, F.N.P. [facility]   Operative Report  PREOPERATIVE DIAGNOSIS:  Multinodular thyroid goiter with compressive symptoms.  POSTOPERATIVE DIAGNOSIS:  Multinodular thyroid goiter with compressive symptoms.  PROCEDURE:  Total thyroidectomy.  SURGEON:  Velora Heckler, M.D.  ANESTHESIA:  General.  ESTIMATED BLOOD LOSS:  Less than 100 cc.  PREPARATION:  Betadine.  COMPLICATIONS:  None.  INDICATIONS:  The patient is a 50 year old white female, who presents with longstanding thyroid goiter.  This had been diagnosed 17 years ago.  She has had progressive increase in size.  She notes moderate compressive symptoms with occasional difficulty swallowing and choking with both solids and liquids.  She has frequent clearing of the throat.  She sleeps at night propped on two pillows due to shortness of breath in a supine position.  She has had intermittent tenderness of the gland.  The patient has previously been on Synthroid for replacement therapy.  She now comes to surgery for thyroidectomy for treatment of multinodular goiter with compressive symptoms.  DESCRIPTION OF PROCEDURE:  The procedure is done in OR #1 at the [facility].  The patient is taken to the operating room and placed in a supine position on the operating room table.  Following the administration of general anesthesia, the patient is positioned and then prepped and draped in the usual strict aseptic fashion.  After ascertaining that an adequate level of anesthesia had been obtained, a standard Kocher incision is made with a #10 blade.  Dissection is carried down through the skin, subcutaneous tissues, and platysma.  Hemostasis is obtained with the  electrocautery.  Skin flaps are developed cephalad and caudad from the thyroid notch to the sternal notch.  A Mahorner self-retaining retractor is placed for exposure.  The strap muscles are incised in the midline.  Dissection is begun on the right side of the neck which is the larger of the two thyroid lobes.  Dissection is carried laterally.  The strap muscles are somewhat adherent to the underlying capsule of the gland.  Superior pole is dissected out.  Middle thyroid vein is divided between Ligaclips.  Inferior pole is mobilized and the inferior venous tributaries divided between medium Ligaclips.  Gland is rolled anteriorly. Pole is mobilized superiorly.  The superior pole vessels are divided between hemostats and ligated with 2-0 silk ties.  The gland is rolled further anteriorly.  Recurrent laryngeal nerve is identified.  Parathyroid tissue is identified.  Branches of the inferior thyroid artery are divided between small Ligaclips.  The venous tributaries are divided between small Ligaclips.  The gland is rolled up anteriorly onto the surface of the trachea and mobilized off the trachea using the electrocautery for hemostasis.  Next, we turned our attention to the left thyroid lobe.  Again, strap muscles are reflected laterally.  Large venous tributaries representing the middle thyroid vein are divided between medium Ligaclips.  There is a firm nodule present in the inferior pole of the left lobe.  Inferior venous tributaries are divided between medium Ligaclips.  Superior pole is mobilized and divided between hemostats and ligated with 2-0 silk ties.  The gland is rolled anteriorly.  The recurrent laryngeal nerve as well as parathyroid tissue is identified and preserved.  Branches of the inferior thyroid artery are divided between small Ligaclips.  Venous tributaries are also divided between small Ligaclips. The gland is further mobilized up and onto the trachea, and then the entire  gland is excised off the anterior trachea using the electrocautery for hemostasis. The gland is submitted to pathology for review.  Good hemostasis is assured on both sides.  Suture ligature with 3-0 Vicryl suture ligatures required in the right neck near the recurrent laryngeal nerve.  Two pieces of Surgicel are placed on each side of the neck overlying the area of the recurrent laryngeal nerve with good hemostasis noted.  The strap muscles are reapproximated in the midline with interrupted 3-0 Vicryl sutures.  Platysma is reapproximated with interrupted 3-0 Vicryl sutures.  The skin edges are reapproximated with widely spaced stainless steel staples and interspaced 1/2 inch Steri-Strips and Benzoin.  Sterile gauze dressings are placed.  The patient is awakened from anesthesia and brought to the recovery room in stable condition.  The patient tolerated the procedure well. DD:  07/05/00 TD:  07/05/00 Job: [id number]

## 2010-05-26 NOTE — Discharge Summary (Signed)
[facility]  Patient:    Latoya Clark, Latoya Clark                     MRN: [id number] Adm. Date:  [id number] Disc. Date: [id number] Attending:  Bonnetta Barry CC:         Fritzi Mandes, M.D.             Tomi Bamberger, F.N.P.; [facility]                           Discharge Summary  REASON FOR ADMISSION:  Thyroid goiter with compressive symptoms.  BRIEF HISTORY:  Patient is a 50 year old white female who presents at the request of Dr. Mertie Clause with a long-standing history of thyroid goiter with compressive symptoms.  She now comes to surgery for thyroidectomy.  HOSPITAL COURSE:  Patient was admitted on July 05, 2000.  She was taken directly to the operating room where she underwent total thyroidectomy for multinodular goiter with compression symptoms.  Initial postoperative course was straight forward.  She was stable through the recovery room.  She was seen on evening rounds by Dr. Cyndia Bent and was doing well on the floor. However, mid evening the patient developed acute discomfort in the neck with expansion of her cervical wound consistent with evolving hematoma.  She experienced loss of airway.  She required mechanical ventilatory assistance on the floor.  The neck was opened at the bed side to evacuate the hematoma.  The patient was taken emergently back to the operating room for wound exploration and was found to have bleeding from her right superior thyroid artery.  This was ligated in the operating room and the wound was reclosed.  The remaining hospital course involved transfer to the intensive care unit.  The patient was maintained, intubated postoperative for one night.  She was extubated the next day without difficulty.  She was started on a clear liquid diet.  Two Jackson-Pratt drains remained in the neck for 36 hours and then were removed. Patients hemoglobin came to a low of 7.0, but had risen to 7.6 by the time  of discharge.  Calcium level stabilized at 8.6 on oral calcium supplementation. Final pathology is still pending at the time of discharge.  PLAN:  Patient is discharged home today, July 09, 2000, in good condition, tolerating a regular diet, and ambulating independently.  Patient will be seen back in my office at [facility] in two weeks.  DISCHARGE MEDICATIONS: 1. Vicodin p.r.n. pain. 2. Os-Cal with vitamin D 1000 mg b.i.d. 3. Multivitamin. 4. Iron sulfate 325 mg b.i.d. 5. Levoxyl 0.137 mg q.d.  FINAL DIAGNOSES:  Multinodular goiter with compressive symptoms.  CONDITION ON DISCHARGE:  Good. DD:  07/09/00 TD:  07/09/00 Job: [id number]

## 2010-06-21 NOTE — Discharge Summary (Signed)
NAME:  Latoya Clark, Latoya Clark NO.:  [id number]  MEDICAL RECORD NO.:  [id number]           PATIENT TYPE:  I  LOCATION:  1604                         FACILITY:  [facility]  PHYSICIAN:  Ollen Gross, M.D.    DATE OF BIRTH:  07-Jan-1961  DATE OF ADMISSION:  05/19/2010 DATE OF DISCHARGE:  05/22/2010                              DISCHARGE SUMMARY   ADMITTING DIAGNOSES: 1. Osteoarthritis, right knee. 2. Hypothyroidism. 3. Fibromyalgia. 4. History of tobacco use. 5. Hypertension. 6. History of depression.  DISCHARGE DIAGNOSES: 1. Osteoarthritis, right knee, status post right total knee     replacement arthroplasty. 2. Postop acute blood loss anemia, did not require transfusion. 3. Postop hyponatremia, improving. 4. Postop hypokalemia. 5. Hypothyroidism. 6. Fibromyalgia. 7. History of tobacco use. 8. Hypertension. 9. History of depression.  PROCEDURE:  May 19, 2010, right total knee.  SURGEON:  Ollen Gross, M.D.  ASSISTANT:  Alexzandrew L. Perkins, P.A.C.  SURGERY PERFORMED:  Under spinal anesthesia with tourniquet time of 37 minutes.  CONSULTS:  None.  BRIEF HISTORY:  The patient is a 50 year old female with advanced arthritis of the right knee, progressive worsening pain and dysfunction, failed nonoperative management and now presents for total knee arthroplasty.  LABORATORY DATA:  Her preop CBC was not found in the chart.  Postop hemoglobin was down to 10.4 with crit of 31.6.  CBCs were followed. Hemoglobin dropped down to 9.9.  Last H and H 9.3 and 26.8.  Chem panel on admission was not scanned into the chart.  Serial BMETs were followed.  Sodium did drop from 140 to 128, back up to 131, potassium dropped to 3.7 to 3.3, last noted at 3.2.  Blood group type O negative.  HOSPITAL COURSE:  The patient was admitted to [facility], taken to OR, underwent above-stated procedure without complication.  The patient tolerated the procedure well, later  transferred to recovery room on the orthopedic floor, started on p.o. and IV analgesic with pain control following surgery and doing pretty well on the morning of day 1 and did have some pain.  Started getting up out of bed and discontinued the PCA and weaned over to p.o. medications and by day 2, doing a little bit better on pain control but still with some moderate pain, encouraging p.o. medications.  She had drop in her sodium which was felt to be a dilutional components, so the IV fluids were discontinued.  Also had a little low potassium, given potassium supplements.  Encouraged pulmonary toilet.  Hemoglobin was down but is asymptomatic.  She continued to progress well with therapy.  By day 3, she was progressing with therapy and it is felt she did well with her PT and that she would be able to go home.  She did well and was discharged home later that day.  DISCHARGE PLAN: 1. The patient was discharged home on May 22, 2010. 2. Discharge diagnoses, please see above. 3. Discharge medications, Dilaudid, Robaxin, Xarelto, Nu-Iron.     Continue home meds, clonazepam, Diovan/HCTZ, Entocort,     escitalopram, Lamotrigine, Synthroid, trazodone.  DIET:  Heart-healthy diet.  ACTIVITY:  She  is weightbearing as tolerated, total knee protocol.  Home health PT.  Follow up in 2 weeks.  DISPOSITION:  Home.  CONDITION ON DISCHARGE:  Improved.     Alexzandrew L. Julien Girt, P.A.C.   ______________________________ Ollen Gross, M.D.    ALP/MEDQ  D:  06/08/2010  T:  06/08/2010  Job:  [id number]  Electronically Signed by Patrica Duel P.A.C. on 06/15/2010 10:42:19 AM Electronically Signed by Ollen Gross M.D. on 06/21/2010 03:23:06 PM

## 2010-07-10 NOTE — H&P (Addendum)
Latoya Clark, MCCUISTION NO.:  [id number]  MEDICAL RECORD NO.:  [id number]  LOCATION:                                 FACILITY:  PHYSICIAN:  Ollen Gross, M.D.    DATE OF BIRTH:  [birth year]  DATE OF ADMISSION:  05/19/2010 DATE OF DISCHARGE:                             HISTORY & PHYSICAL   CHIEF COMPLAINT:  Right knee pain.  HISTORY OF PRESENT ILLNESS:  The patient is a 50 year old female, who has been seen by Dr. Lequita Halt for ongoing right knee pain.  She has had a painful knee for quite some time now.  It has gotten worse over the past several years.  She has been seen in the office as a second opinion and found to have bone-on-bone arthritis in the medial and the patellofemoral compartments.  It was felt that she would benefit from undergoing surgical intervention due to the progressive nature of the pain and arthritis.  The risks and benefits have been discussed.  She elected to proceed with surgery.  ALLERGIES:  NO KNOWN DRUG ALLERGIES.  INTOLERANCES:  MORPHINE causes headaches.  CODEINE causes headaches.  CURRENT MEDICATIONS:  Clonazepam, hydromorphone, lamotrigine, budesonide, escitalopram, trazodone, Synthroid, Diovan/HCTZ.  PAST MEDICAL HISTORY: 1. Astigmatism. 2. Situational anxiety. 3. Bipolar disorder. 4. History of depression. 5. Sarcoidosis. 6. Hypertension. 7. Mild stress urinary incontinence. 8. Hypothyroidism. 9. Osteoarthritis. 10.Postmenopausal.  PAST SURGICAL HISTORY:  Thyroidectomy, 2 cesarean sections, right knee scope, tonsillectomy, and D and C.  FAMILY HISTORY:  Father with arthritis and diabetes.  SOCIAL HISTORY:  Married.  Current smoker, one pack per day for about 20 years.  Her sister and husband will be assisting her with care after surgery.  REVIEW OF SYSTEMS:  GENERAL:  No fevers, chills, or night sweats. NEUROLOGIC:  No seizures, syncope, or paralysis.  RESPIRATORY:  No shortness of breath, productive cough, or  hemoptysis.  CARDIOVASCULAR: No shortness of breath, chest pain, or angina.  GASTROINTESTINAL:  No nausea, vomiting, diarrhea, or constipation.  GENITOURINARY:  A little bit of frequency and incontinence.  No dysuria or hematuria. MUSCULOSKELETAL:  Knee pain.  PHYSICAL EXAMINATION:  VITAL SIGNS:  Pulse 84, respirations 12, blood pressure 124/82. GENERAL:  A 50 year old white female, well-nourished, well-developed, overweight, in no acute distress.  She is alert and oriented, and cooperative. HEENT:  Normocephalic, atraumatic.  Pupils are equal, round and reactive.  EOMs intact.  Noted to wear glasses. NECK:  Supple. CHEST:  Clear. HEART:  Regular rate and rhythm without murmur.  S1 and S2 noted. ABDOMEN:  Soft, round, slightly protuberant abdomen.  Bowel sounds present. RECTAL:  Not done, not pertinent to present illness. BREASTS:  Not done, not pertinent to present illness. GENITALIA:  Not done, not pertinent to present illness. EXTREMITIES:.  Right lower extremity no effusion with the knee.  Range of motion of the knee 5 to 120.  Marked crepitus.  Tender more medial than lateral.  IMPRESSION:  Osteoarthritis, right knee.  PLAN:  The patient was admitted to [facility] to undergo a right total-knee replacement arthroplasty.  The surgery will be performed by Dr. Ollen Gross.     Alexzandrew L. Julien Girt, P.A.C.  ______________________________ Ollen Gross, M.D.    ALP/MEDQ  D:  05/19/2010  T:  05/19/2010  Job:  [id number]  cc:   Georgann Housekeeper, MD Fax: [phone]  Electronically Signed by Patrica Duel P.A.C. on 07/10/2010 08:06:07 AM Electronically Signed by Ollen Gross M.D. on 07/19/2010 12:31:51 PM

## 2010-09-14 ENCOUNTER — Ambulatory Visit (INDEPENDENT_AMBULATORY_CARE_PROVIDER_SITE_OTHER): Payer: BC Managed Care – PPO | Admitting: Women's Health

## 2010-09-14 ENCOUNTER — Other Ambulatory Visit (HOSPITAL_COMMUNITY)
Admission: RE | Admit: 2010-09-14 | Discharge: 2010-09-14 | Disposition: A | Discharge status: Home / Self Care | Payer: BC Managed Care – PPO | Source: Ambulatory Visit | Attending: Women's Health | Admitting: Women's Health

## 2010-09-14 ENCOUNTER — Encounter: Payer: Self-pay | Admitting: Women's Health

## 2010-09-14 VITALS — BP 130/70 | Ht 63.0 in | Wt 202.0 lb

## 2010-09-14 DIAGNOSIS — F329 Major depressive disorder, single episode, unspecified: Secondary | ICD-10-CM | POA: Insufficient documentation

## 2010-09-14 DIAGNOSIS — N644 Mastodynia: Secondary | ICD-10-CM | POA: Insufficient documentation

## 2010-09-14 DIAGNOSIS — R1032 Left lower quadrant pain: Secondary | ICD-10-CM

## 2010-09-14 DIAGNOSIS — Q5210 Doubling of vagina, unspecified: Secondary | ICD-10-CM | POA: Insufficient documentation

## 2010-09-14 DIAGNOSIS — Z01419 Encounter for gynecological examination (general) (routine) without abnormal findings: Secondary | ICD-10-CM

## 2010-09-14 DIAGNOSIS — F32A Depression, unspecified: Secondary | ICD-10-CM | POA: Insufficient documentation

## 2010-09-14 DIAGNOSIS — F17200 Nicotine dependence, unspecified, uncomplicated: Secondary | ICD-10-CM | POA: Insufficient documentation

## 2010-09-14 DIAGNOSIS — M1990 Unspecified osteoarthritis, unspecified site: Secondary | ICD-10-CM | POA: Insufficient documentation

## 2010-09-14 DIAGNOSIS — Z124 Encounter for screening for malignant neoplasm of cervix: Secondary | ICD-10-CM

## 2010-09-14 DIAGNOSIS — G4700 Insomnia, unspecified: Secondary | ICD-10-CM | POA: Insufficient documentation

## 2010-09-14 DIAGNOSIS — Z780 Asymptomatic menopausal state: Secondary | ICD-10-CM

## 2010-09-14 NOTE — Progress Notes (Signed)
Latoya Clark [birth year] [id number]    History:    The patient presents for annual exam.  Numerous medical problems, hypothyroidism, hypertension, sarcoid, depression,anxiety, arthritis. Labs and medications are done at her primary care. She is a smoker, and is aware of the hazards. Daughters Turkey and Lurena Joiner are living in [place] and doing well.   Past medical history, past surgical history, family history and social history were all reviewed and documented in the EPIC chart.   ROS:  A  ROS was performed and pertinent positives and negatives are included in the history.  Exam:  Filed Vitals:   09/14/10 1550  BP: 130/70    General appearance:  Normal Head/Neck:  Normal, without cervical or supraclavicular adenopathy. Thyroid:  Symmetrical, normal in size, without palpable masses or nodularity. Respiratory  Effort:  Normal  Auscultation:  Clear without wheezing or rhonchi Cardiovascular  Auscultation:  Regular rate, without rubs, murmurs or gallops  Edema/varicosities:  Not grossly evident Abdominal  Soft,nontender, without masses, guarding or rebound.  Liver/spleen:  No organomegaly noted  Hernia:  None appreciated  Skin  Inspection:  Grossly normal  Palpation:  Grossly normal Neurologic/psychiatric  Orientation:  Normal with appropriate conversation.  Mood/affect:  Normal  Genitourinary    Breasts: Examined lying and sitting.     Right: Without masses, retractions, discharge or axillary adenopathy.     Left: Without masses, retractions, discharge or axillary adenopathy.   Inguinal/mons:  Normal without inguinal adenopathy  External genitalia:  Normal  BUS/Urethra/Skene's glands:  Normal  Bladder:  Normal  Vagina:  Normal  Cervix:  Normal,IUD strings not visible  Uterus:  retroverted, normal in size, shape and contour.  Midline and mobile  Adnexa/parametria:     Rt: Without masses or tenderness.   Lt: Without masses / tenderness entire left lower quadrant,  radiates toward back .  Anus and perineum: Normal  Digital rectal exam: Normal sphincter tone without palpated masses or tenderness  Assessment/Plan:  50 y.o. MWF G2P2 for annual exam. Amenorrheic since Mirena IUD placed February of 2009/ history of vaginal septum. She is currently having some left lower quadrant pain that started yesterday. Denies any nausea ,vomiting, diarrhea or constipation. Denies relief or change of pain after bowel movement. Has been diagnosed with colitis states that has been stable. She was quite tender with abdominal exam on the left lower quadrant area as well as with bimanual exam. Will get an ultrasound to rule out ovarian problem. Did review this pain is most likely related to colitis or GI and will followup with her GI doctor. She had a colonoscopy in 2011 that demonstrated colitis. She has had an increase in problems with anxiety and depression this past year. She is in counseling and has seen a psychiatrist and will continue. She had a right knee replacement in May of 2012, which she has done fairly well with.  Plan: Ultrasound to rule out ovarian problem, remove IUD with ultrasound. Schedule a DEXA, she had slight osteopenia/-1.5 at spine in 2009. Encouraged exercise, SBEs annual mammogram, calcium rich diet, continue care with primary care.  Harrington Challenger [facility], 4:39 PM 09/14/2010

## 2010-10-09 ENCOUNTER — Encounter: Payer: Self-pay | Admitting: Women's Health

## 2010-10-09 ENCOUNTER — Ambulatory Visit (INDEPENDENT_AMBULATORY_CARE_PROVIDER_SITE_OTHER): Payer: BC Managed Care – PPO | Admitting: Women's Health

## 2010-10-09 ENCOUNTER — Ambulatory Visit (INDEPENDENT_AMBULATORY_CARE_PROVIDER_SITE_OTHER): Admission: RE | Admit: 2010-10-09 | Payer: BC Managed Care – PPO | Source: Ambulatory Visit

## 2010-10-09 VITALS — BP 130/70

## 2010-10-09 DIAGNOSIS — R1032 Left lower quadrant pain: Secondary | ICD-10-CM

## 2010-10-09 DIAGNOSIS — T8339XA Other mechanical complication of intrauterine contraceptive device, initial encounter: Secondary | ICD-10-CM

## 2010-10-09 DIAGNOSIS — N83209 Unspecified ovarian cyst, unspecified side: Secondary | ICD-10-CM

## 2010-10-09 DIAGNOSIS — N7013 Chronic salpingitis and oophoritis: Secondary | ICD-10-CM

## 2010-10-09 NOTE — Progress Notes (Signed)
  Presents for an ultrasound due to persistent lower left quadrant discomfort. Has seen her GI Dr. Did not related to GI problems. She is amenorrheic with a Mirena IUD. Ultrasound today does show IUD in the normal position, right ovary normal, between the uterus and the right ovary is a tubular-shaped fluid-filled mass 49 x 10 mm was negative PFT, containing particulate matter, questionable hydrosalpinx. Left ovary normal. Her last ultrasound was in 09 and this was not visualized. Will check an FSH, and the CA 125. If both test negative will repeat ultrasound in 2 months.

## 2010-10-12 ENCOUNTER — Other Ambulatory Visit: Payer: Self-pay | Admitting: Women's Health

## 2010-10-12 DIAGNOSIS — N83209 Unspecified ovarian cyst, unspecified side: Secondary | ICD-10-CM

## 2010-10-24 ENCOUNTER — Telehealth: Payer: Self-pay | Admitting: *Deleted

## 2010-10-24 ENCOUNTER — Other Ambulatory Visit: Payer: Self-pay | Admitting: Gynecology

## 2010-10-24 ENCOUNTER — Ambulatory Visit (INDEPENDENT_AMBULATORY_CARE_PROVIDER_SITE_OTHER)
Admission: RE | Admit: 2010-10-24 | Discharge: 2010-10-24 | Payer: BC Managed Care – PPO | Source: Ambulatory Visit | Attending: Women's Health | Admitting: Women's Health

## 2010-10-24 DIAGNOSIS — M8580 Other specified disorders of bone density and structure, unspecified site: Secondary | ICD-10-CM

## 2010-10-24 DIAGNOSIS — Z780 Asymptomatic menopausal state: Secondary | ICD-10-CM

## 2010-10-24 DIAGNOSIS — M899 Disorder of bone, unspecified: Secondary | ICD-10-CM

## 2010-10-24 NOTE — Telephone Encounter (Signed)
Lm to call regarding bone density report, recheck of labs.

## 2010-11-14 NOTE — Telephone Encounter (Signed)
Pt informed with dexa report and to recheck labs at the office, order in computer.

## 2010-11-17 ENCOUNTER — Other Ambulatory Visit: Payer: BC Managed Care – PPO | Admitting: *Deleted

## 2010-11-17 DIAGNOSIS — M8580 Other specified disorders of bone density and structure, unspecified site: Secondary | ICD-10-CM

## 2010-11-20 LAB — PTH, INTACT AND CALCIUM
Calcium, Total (PTH): 9.5 mg/dL (ref 8.4–10.5)
PTH: 16.7 pg/mL (ref 14.0–72.0)

## 2010-12-13 ENCOUNTER — Telehealth: Payer: Self-pay | Admitting: *Deleted

## 2010-12-13 MED ORDER — TERCONAZOLE 0.8 % VA CREA: 1.0000 | TOPICAL_CREAM | Freq: Every day | VAGINAL | Status: AC

## 2010-12-13 NOTE — Telephone Encounter (Signed)
Patient called and wanted to speak to you directly.

## 2010-12-13 NOTE — Telephone Encounter (Signed)
Left message on cell to call office.

## 2010-12-13 NOTE — Telephone Encounter (Signed)
States has had problems with vaginal itching and discharge, has been on an antibiotic/Keflex for a skin infection for 2 weeks per her dermatologist. Requests medication. Will call in Terazol 3 office visit if no relief after medication.

## 2011-01-18 ENCOUNTER — Ambulatory Visit (INDEPENDENT_AMBULATORY_CARE_PROVIDER_SITE_OTHER): Payer: BC Managed Care – PPO | Admitting: Women's Health

## 2011-01-18 ENCOUNTER — Encounter: Payer: Self-pay | Admitting: Women's Health

## 2011-01-18 ENCOUNTER — Other Ambulatory Visit: Payer: Self-pay | Admitting: Women's Health

## 2011-01-18 DIAGNOSIS — R350 Frequency of micturition: Secondary | ICD-10-CM

## 2011-01-18 LAB — URINALYSIS W MICROSCOPIC + REFLEX CULTURE
Bilirubin Urine: NEGATIVE
Crystals: NONE SEEN
Nitrite: NEGATIVE
Specific Gravity, Urine: 1.015 (ref 1.005–1.030)
Urobilinogen, UA: 0.2 mg/dL (ref 0.0–1.0)

## 2011-01-18 MED ORDER — CIPROFLOXACIN HCL 500 MG PO TABS: 500.0000 mg | ORAL_TABLET | Freq: Two times a day (BID) | ORAL | Status: AC

## 2011-01-18 NOTE — Progress Notes (Signed)
Patient ID: Latoya Clark, female   DOB: 04/23/1960, 51 y.o.   MRN: [id number] Presents with a complaint of urinary frequency, urgency and burning for several days. Denies any vaginal discharge or fever. Struggling with anxiety and depression. Situational stressors of home sold and is in the process of moving, father ill with terminal cancer.  UA: Small leukocytes, 7-10 WBCs.   UTI  Plan: Cipro 500 by mouth twice a day for 5  days. Urine culture pending, instructed to call if symptoms are not relieved. Had a bone density that showed osteopenia with significant decrease in bone loss instructed to schedule an office visit with Dr. Lily Peer to discuss. Had normal calcium, PTH, and vitamin D levels.

## 2011-01-22 ENCOUNTER — Encounter: Payer: Self-pay | Admitting: *Deleted

## 2011-01-22 NOTE — Progress Notes (Signed)
Patient ID: Latoya Clark, female   DOB: 1960-03-08, 51 y.o.   MRN: [id number] Pt informed of positive UTI culture and to continue to take medication as directed.

## 2011-01-25 ENCOUNTER — Other Ambulatory Visit: Payer: Self-pay | Admitting: Dermatology

## 2011-01-29 ENCOUNTER — Encounter: Payer: Self-pay | Admitting: Gynecology

## 2011-01-29 ENCOUNTER — Ambulatory Visit (INDEPENDENT_AMBULATORY_CARE_PROVIDER_SITE_OTHER): Payer: BC Managed Care – PPO | Admitting: Gynecology

## 2011-01-29 VITALS — BP 128/90

## 2011-01-29 DIAGNOSIS — M899 Disorder of bone, unspecified: Secondary | ICD-10-CM

## 2011-01-29 DIAGNOSIS — M8580 Other specified disorders of bone density and structure, unspecified site: Secondary | ICD-10-CM

## 2011-01-29 DIAGNOSIS — M949 Disorder of cartilage, unspecified: Secondary | ICD-10-CM

## 2011-01-29 NOTE — Patient Instructions (Addendum)
Please take caltrate plus or oscal or citracal or viactiv one twicew a day. Weight bearing exercises three-four times a week Need to stop smoking  Smoking Cessation This document explains the best ways for you to quit smoking and new treatments to help. It lists new medicines that can double or triple your chances of quitting and quitting for good. It also considers ways to avoid relapses and concerns you may have about quitting, including weight gain. NICOTINE: A POWERFUL ADDICTION If you have tried to quit smoking, you know how hard it can be. It is hard because nicotine is a very addictive drug. For some people, it can be as addictive as heroin or cocaine. Usually, people make 2 or 3 tries, or more, before finally being able to quit. Each time you try to quit, you can learn about what helps and what hurts. Quitting takes hard work and a lot of effort, but you can quit smoking. QUITTING SMOKING IS ONE OF THE MOST IMPORTANT THINGS YOU WILL EVER DO.  You will live longer, feel better, and live better.   The impact on your body of quitting smoking is felt almost immediately:   Within 20 minutes, blood pressure decreases. Pulse returns to its normal level.   After 8 hours, carbon monoxide levels in the blood return to normal. Oxygen level increases.   After 24 hours, chance of heart attack starts to decrease. Breath, hair, and body stop smelling like smoke.   After 48 hours, damaged nerve endings begin to recover. Sense of taste and smell improve.   After 72 hours, the body is virtually free of nicotine. Bronchial tubes relax and breathing becomes easier.   After 2 to 12 weeks, lungs can hold more air. Exercise becomes easier and circulation improves.   Quitting will reduce your risk of having a heart attack, stroke, cancer, or lung disease:   After 1 year, the risk of coronary heart disease is cut in half.   After 5 years, the risk of stroke falls to the same as a nonsmoker.    After 10 years, the risk of lung cancer is cut in half and the risk of other cancers decreases significantly.   After 15 years, the risk of coronary heart disease drops, usually to the level of a nonsmoker.   If you are pregnant, quitting smoking will improve your chances of having a healthy baby.   The people you live with, especially your children, will be healthier.   You will have extra money to spend on things other than cigarettes.  FIVE KEYS TO QUITTING Studies have shown that these 5 steps will help you quit smoking and quit for good. You have the best chances of quitting if you use them together: 1. Get ready.  2. Get support and encouragement.  3. Learn new skills and behaviors.  4. Get medicine to reduce your nicotine addiction and use it correctly.  5. Be prepared for relapse or difficult situations. Be determined to continue trying to quit, even if you do not succeed at first.  1. GET READY  Set a quit date.   Change your environment.   Get rid of ALL cigarettes, ashtrays, matches, and lighters in your home, car, and place of work.   Do not let people smoke in your home.   Review your past attempts to quit. Think about what worked and what did not.   Once you quit, do not smoke. NOT EVEN A PUFF!  2. GET SUPPORT  AND ENCOURAGEMENT Studies have shown that you have a better chance of being successful if you have help. You can get support in many ways.  Tell your family, friends, and coworkers that you are going to quit and need their support. Ask them not to smoke around you.   Talk to your caregivers (doctor, dentist, nurse, pharmacist, psychologist, and/or smoking counselor).   Get individual, group, or telephone counseling and support. The more counseling you have, the better your chances are of quitting. Programs are available at [employer] and health centers. Call your local health department for information about programs in your area.   Spiritual beliefs  and practices may help some smokers quit.   Quit meters are Photographer that keep track of quit statistics, such as amount of "quit-time," cigarettes not smoked, and money saved.   Many smokers find one or more of the many self-help books available useful in helping them quit and stay off tobacco.  3. LEARN NEW SKILLS AND BEHAVIORS  Try to distract yourself from urges to smoke. Talk to someone, go for a walk, or occupy your time with a task.   When you first try to quit, change your routine. Take a different route to work. Drink tea instead of coffee. Eat breakfast in a different place.   Do something to reduce your stress. Take a hot bath, exercise, or read a book.   Plan something enjoyable to do every day. Reward yourself for not smoking.   Explore interactive web-based programs that specialize in helping you quit.  4. GET MEDICINE AND USE IT CORRECTLY Medicines can help you stop smoking and decrease the urge to smoke. Combining medicine with the above behavioral methods and support can quadruple your chances of successfully quitting smoking. The [place] Food and Drug Administration (FDA) has approved 7 medicines to help you quit smoking. These medicines fall into 3 categories.  Nicotine replacement therapy (delivers nicotine to your body without the negative effects and risks of smoking):   Nicotine gum: Available over-the-counter.   Nicotine lozenges: Available over-the-counter.   Nicotine inhaler: Available by prescription.   Nicotine nasal spray: Available by prescription.   Nicotine skin patches (transdermal): Available by prescription and over-the-counter.   Antidepressant medicine (helps people abstain from smoking, but how this works is unknown):   Bupropion sustained-release (SR) tablets: Available by prescription.   Nicotinic receptor partial agonist (simulates the effect of nicotine in your brain):   Varenicline tartrate tablets:  Available by prescription.   Ask your caregiver for advice about which medicines to use and how to use them. Carefully read the information on the package.   Everyone who is trying to quit may benefit from using a medicine. If you are pregnant or trying to become pregnant, nursing an infant, you are under age 35, or you smoke fewer than 10 cigarettes per day, talk to your caregiver before taking any nicotine replacement medicines.   You should stop using a nicotine replacement product and call your caregiver if you experience nausea, dizziness, weakness, vomiting, fast or irregular heartbeat, mouth problems with the lozenge or gum, or redness or swelling of the skin around the patch that does not go away.   Do not use any other product containing nicotine while using a nicotine replacement product.   Talk to your caregiver before using these products if you have diabetes, heart disease, asthma, stomach ulcers, you had a recent heart attack, you have high blood pressure that is  not controlled with medicine, a history of irregular heartbeat, or you have been prescribed medicine to help you quit smoking.  5. BE PREPARED FOR RELAPSE OR DIFFICULT SITUATIONS  Most relapses occur within the first 3 months after quitting. Do not be discouraged if you start smoking again. Remember, most people try several times before they finally quit.   You may have symptoms of withdrawal because your body is used to nicotine. You may crave cigarettes, be irritable, feel very hungry, cough often, get headaches, or have difficulty concentrating.   The withdrawal symptoms are only temporary. They are strongest when you first quit, but they will go away within 10 to 14 days.  Here are some difficult situations to watch for:  Alcohol. Avoid drinking alcohol. Drinking lowers your chances of successfully quitting.   Caffeine. Try to reduce the amount of caffeine you consume. It also lowers your chances of successfully  quitting.   Other smokers. Being around smoking can make you want to smoke. Avoid smokers.   Weight gain. Many smokers will gain weight when they quit, usually less than 10 pounds. Eat a healthy diet and stay active. Do not let weight gain distract you from your main goal, quitting smoking. Some medicines that help you quit smoking may also help delay weight gain. You can always lose the weight gained after you quit.   Bad mood or depression. There are a lot of ways to improve your mood other than smoking.  If you are having problems with any of these situations, talk to your caregiver. SPECIAL SITUATIONS AND CONDITIONS Studies suggest that everyone can quit smoking. Your situation or condition can give you a special reason to quit.  Pregnant women/new mothers: By quitting, you protect your baby's health and your own.   Hospitalized patients: By quitting, you reduce health problems and help healing.   Heart attack patients: By quitting, you reduce your risk of a second heart attack.   Lung, head, and neck cancer patients: By quitting, you reduce your chance of a second cancer.   Parents of children and adolescents: By quitting, you protect your children from illnesses caused by secondhand smoke.  QUESTIONS TO THINK ABOUT Think about the following questions before you try to stop smoking. You may want to talk about your answers with your caregiver.  Why do you want to quit?   If you tried to quit in the past, what helped and what did not?   What will be the most difficult situations for you after you quit? How will you plan to handle them?   Who can help you through the tough times? Your family? Friends? Caregiver?   What pleasures do you get from smoking? What ways can you still get pleasure if you quit?  Here are some questions to ask your caregiver:  How can you help me to be successful at quitting?   What medicine do you think would be best for me and how should I take it?    What should I do if I need more help?   What is smoking withdrawal like? How can I get information on withdrawal?  Quitting takes hard work and a lot of effort, but you can quit smoking. FOR MORE INFORMATION  Smokefree.gov (http://www.davis-sullivan.com/) provides free, accurate, evidence-based information and professional assistance to help support the immediate and long-term needs of people trying to quit smoking. Document Released: 12/19/2000 Document Revised: 09/06/2010 Document Reviewed: 10/11/2008 [facility] Patient Information 2012 [place], [place].  Osteoporosis Osteoporosis is  a disease of the bones that makes them weaker and prone to break (fracture). By their mid-30s, most people begin to gradually lose bone strength. If this is severe enough, osteoporosis may occur. Osteopenia is a less severe weakness of the bones, which places you at risk for osteoporosis. It is important to identify if you have osteoporosis or osteopenia. Bone fractures from osteoporosis (especially hip and spine fractures) are a major cause of hospitalization, loss of independence, and can lead to life-threatening complications. CAUSES  There are a number of causes and risk factors:  Gender. Women are at a higher risk for osteoporosis than men.   Age. Bone formation slows down with age.   Ethnicity. For unclear reasons, white and Asian women are at higher risk for osteoporosis. Hispanic and African American women are at increased, but lesser, risk.   Family history of osteoporosis can mean that you are at a higher risk for getting it.   History of bone fractures indicates you may be at higher risk of another.   Calcium is very important for bone health and strength. Not enough calcium in your diet increases your risk for osteoporosis. Vitamin D is important for calcium metabolism. You get vitamin D from sunlight, foods, or supplements.   Physical activity. Bones get stronger with weight-bearing exercise and  weaker without use.   Smoking is associated with decreased bone strength.   Medicines. Cortisone medicines, too much thyroid medicine, some cancer and seizure medicines, and others can weaken bones and cause osteoporosis.   Decreased body weight is associated with osteoporosis. The small amount of estrogen-type molecules produced in fat cells seems to protect the bones.   Menopausal decrease in the hormone estrogen can cause osteoporosis.   Low levels of the hormone testosterone can cause osteoporosis.   Some medical conditions can lead to osteoporosis (hyperthyroidism, hyperparathyroidism, B12 deficiency).  SYMPTOMS  Usually, no symptoms are felt as the bones weaken. The first symptoms are generally related to bone fractures. You may have silent, tiny bone fractures, especially in your spine. This can cause height loss and forward bending of the spine (kyphosis). DIAGNOSIS  You or your caregiver may suspect osteoporosis based on height loss and kyphosis. Osteoporosis or osteopenia may be identified on an X-ray done for other reasons. A bone density measurement will likely be taken. Your bones are often measured at your lower spine or your hips. Measurement is done by an X-ray called a DEXA scan, or sometimes by a computerized X-ray scan (CT or CAT scan). Other tests may be done to find the cause of osteoporosis, such as blood tests to measure calcium and vitamin D, or to monitor treatment. TREATMENT  The goal of osteoporosis treatment is to prevent fractures. This is done through medicine and home care treatments. Treatment will slow the weakening of your bones and strengthen them where possible. Measures to decrease the likelihood of falling and fracturing a bone are also important. Medicine  You may need supplements if you are not getting enough calcium, vitamin D, and vitamin B12.   If you are female and menopausal, you should discuss the option of estrogen replacement or estrogen-like  medicine with your caregiver.   Medicines can be taken by mouth or injection to help build bone strength. When taken by mouth, there are important directions that you need to follow.   Calcitonin is a hormone made by the thyroid gland that can help build bone strength and decrease fracture risk in the spine. It can be  taken by nasal spray or injection.   Parathyroid hormone can be injected to help build bone strength.   You will need to continue to get enough calcium intake with any of these medicines.  FALL PREVENTION  If you are unsteady on your feet, use a cane, walker, or walk with someone's help.   Remove loose rugs or electrical cords from your home.   Keep your home well lit at night. Use glasses if you need them.   Avoid icy streets and wet or waxed floors.   Hold the railing when using stairs.   Watch out for your pets.   Install grab bars in your bathroom.   Exercise. Physical activity, especially weight-bearing exercise, helps strengthen bones. Strength and balance exercise, such as tai chi, helps prevent falls.   Alcohol and some medicines can make you more likely to fall. Discuss alcohol use with your caregiver. Ask your caregiver if any of your medicines might increase your risk for falling. Ask if safer alternatives are available.  HOME CARE INSTRUCTIONS   Try to prevent and avoid falls.   To pick up objects, bend at the knees. Do not bend with your back.   Do not smoke. If you smoke, ask for help to stop.   Have adequate calcium and vitamin D in your diet. Talk with your caregiver about amounts.   Before exercising, ask your caregiver what exercises will be good for you.   Only take over-the-counter or prescription medicines for pain, discomfort, or fever as directed by your caregiver.  SEEK MEDICAL CARE IF:   You have had a fracture and your pain is not controlled.   You have had a fracture and you are not able to return to activities as expected.   You  are reinjured.   You develop side effects from medicines, especially stomach pain or trouble swallowing.   You develop new, unexplained problems.  SEEK IMMEDIATE MEDICAL CARE IF:   You develop sudden, severe pain in your back.   You develop pain after an injury or fall.  Document Released: 10/04/2004 Document Revised: 09/06/2010 Document Reviewed: 08/22/2006 [facility] Patient Information 2012 [place], [place].

## 2011-01-29 NOTE — Progress Notes (Signed)
Patient is a 51 year old who presented to the office today to discuss her recent bone density study. The study was compared with previous study done here in our office in 2009.  Bone mineral density AP spine no significant change, T score -1.7  Total left hip no significant change normal T score  Total right hip significant decreased bone mineralization -8.6% normal T score  Recent calcium, PTH, vitamin D normal  Patient is a chronic smoker for 18 years and is recently smoking one pack cigarette per day. 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 Patient been treated for anxiety and depression.  Based on the findings of her AP spine with a T score of -1.7 and the case her bone masses 15-20% below normal and hasn't 3 times greater risk of fracture of the spine. She is currently not taking calcium and vitamin D and has been in active that her knee surgery last year.  We discussed importance of calcium of at least 1200 1500 mg daily along with vitamin D of 1000-2000 units daily. She should begin some form of weightbearing exercises 30-45 minutes 3 or 4 times a week. We discussed also the importance of her discontinuing her smoking which would accelerate her bone loss and she continues. [place] smoking cessation support group has been recommended as well. We will recheck her bone density study in 2 years if it continues to decrease we will place her on an antiresorptive agent. Literature information on osteoporosis and smoking cessation was provided.

## 2011-02-05 ENCOUNTER — Other Ambulatory Visit: Payer: Self-pay | Admitting: Dermatology

## 2011-02-12 ENCOUNTER — Other Ambulatory Visit: Payer: Self-pay | Admitting: Dermatology

## 2011-04-23 ENCOUNTER — Other Ambulatory Visit: Payer: Self-pay | Admitting: Dermatology

## 2011-07-02 ENCOUNTER — Encounter (HOSPITAL_COMMUNITY): Payer: Self-pay | Admitting: *Deleted

## 2011-07-02 ENCOUNTER — Ambulatory Visit (HOSPITAL_COMMUNITY)
Admission: RE | Admit: 2011-07-02 | Discharge: 2011-07-02 | Disposition: A | Discharge status: Home / Self Care | Payer: BC Managed Care – PPO | Attending: Psychiatry | Admitting: Psychiatry

## 2011-07-02 DIAGNOSIS — E039 Hypothyroidism, unspecified: Secondary | ICD-10-CM | POA: Insufficient documentation

## 2011-07-02 DIAGNOSIS — Z9889 Other specified postprocedural states: Secondary | ICD-10-CM | POA: Insufficient documentation

## 2011-07-02 DIAGNOSIS — M129 Arthropathy, unspecified: Secondary | ICD-10-CM | POA: Insufficient documentation

## 2011-07-02 DIAGNOSIS — N644 Mastodynia: Secondary | ICD-10-CM | POA: Insufficient documentation

## 2011-07-02 DIAGNOSIS — Z975 Presence of (intrauterine) contraceptive device: Secondary | ICD-10-CM | POA: Insufficient documentation

## 2011-07-02 DIAGNOSIS — F332 Major depressive disorder, recurrent severe without psychotic features: Secondary | ICD-10-CM | POA: Insufficient documentation

## 2011-07-02 DIAGNOSIS — I10 Essential (primary) hypertension: Secondary | ICD-10-CM | POA: Insufficient documentation

## 2011-07-02 DIAGNOSIS — F17200 Nicotine dependence, unspecified, uncomplicated: Secondary | ICD-10-CM | POA: Insufficient documentation

## 2011-07-02 DIAGNOSIS — G4700 Insomnia, unspecified: Secondary | ICD-10-CM | POA: Insufficient documentation

## 2011-07-02 DIAGNOSIS — D869 Sarcoidosis, unspecified: Secondary | ICD-10-CM | POA: Insufficient documentation

## 2011-07-02 DIAGNOSIS — Z888 Allergy status to other drugs, medicaments and biological substances status: Secondary | ICD-10-CM | POA: Insufficient documentation
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
## 2011-07-04 ENCOUNTER — Other Ambulatory Visit (HOSPITAL_COMMUNITY): Payer: BC Managed Care – PPO | Attending: Psychiatry

## 2011-07-04 ENCOUNTER — Encounter (HOSPITAL_COMMUNITY): Payer: Self-pay

## 2011-07-04 DIAGNOSIS — G4700 Insomnia, unspecified: Secondary | ICD-10-CM | POA: Insufficient documentation

## 2011-07-04 DIAGNOSIS — F603 Borderline personality disorder: Secondary | ICD-10-CM | POA: Insufficient documentation

## 2011-07-04 DIAGNOSIS — I10 Essential (primary) hypertension: Secondary | ICD-10-CM | POA: Insufficient documentation

## 2011-07-04 DIAGNOSIS — N644 Mastodynia: Secondary | ICD-10-CM | POA: Insufficient documentation

## 2011-07-04 DIAGNOSIS — F332 Major depressive disorder, recurrent severe without psychotic features: Secondary | ICD-10-CM

## 2011-07-04 DIAGNOSIS — F17200 Nicotine dependence, unspecified, uncomplicated: Secondary | ICD-10-CM | POA: Insufficient documentation

## 2011-07-04 DIAGNOSIS — F411 Generalized anxiety disorder: Secondary | ICD-10-CM | POA: Insufficient documentation

## 2011-07-04 DIAGNOSIS — E039 Hypothyroidism, unspecified: Secondary | ICD-10-CM | POA: Insufficient documentation

## 2011-07-04 DIAGNOSIS — D869 Sarcoidosis, unspecified: Secondary | ICD-10-CM | POA: Insufficient documentation

## 2011-07-04 DIAGNOSIS — M129 Arthropathy, unspecified: Secondary | ICD-10-CM | POA: Insufficient documentation

## 2011-07-04 NOTE — Progress Notes (Unsigned)
    Daily Group Progress Note  Program: {CHL AMB BH IOP/CDIOP Program Type:21022744}  Group Time:   Participation Level: {CHL AMB BH Group Participation:21022742}  Behavioral Response: {CHL AMB BH Group Behavior:21022743}  Type of Therapy:  {CHL AMB BH Type of Therapy:21022741}  Summary of Progress: ***     Group Time:   Participation Level:  {CHL AMB BH Group Participation:21022742}  Behavioral Response: {CHL AMB BH Group Behavior:21022743}  Type of Therapy: {CHL AMB BH Type of Therapy:21022741}  Summary of Progress: ***  [facility] 
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
## 2011-07-04 NOTE — Progress Notes (Signed)
    Daily Group Progress Note  Program: IOP  Group Time: 0900 - 1030  Participation Level: None  Behavioral Response: not in this group  Type of Therapy:  Process Group  Summary of Progress: Carlisle was being assessed during this group and did not join [place] until we were ending.     Group Time: 1045 - 1200  Participation Level:  Minimal  Behavioral Response: Appropriate and Sharing  Type of Therapy: Psycho-education Group  Summary of Progress: Group concerned making S.M.A.R.T. Goals, using a handout by that name.  Examples were discussed and changed to fit the criteria with ideas provided by group members.  Zanai did share at the end that she is very depressed since the death of her father in 30-Jun-2011.  As the group listened and also shared their experience of grief with her, her voice became stronger and she made eye contact with group members.  Shonna Chock, APRN, CNS  [facility]

## 2011-07-05 ENCOUNTER — Other Ambulatory Visit (HOSPITAL_COMMUNITY): Payer: BC Managed Care – PPO

## 2011-07-05 MED ORDER — BUPROPION HCL 100 MG PO TABS: 100.0000 mg | ORAL_TABLET | Freq: Two times a day (BID) | ORAL | Status: DC

## 2011-07-05 MED ORDER — RISPERIDONE 1 MG PO TABS: 1.0000 mg | ORAL_TABLET | Freq: Two times a day (BID) | ORAL | Status: DC

## 2011-07-05 MED ORDER — ESCITALOPRAM OXALATE 20 MG PO TABS: 20.0000 mg | ORAL_TABLET | ORAL | Status: DC
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
## 2011-07-05 NOTE — Progress Notes (Signed)
    Daily Group Progress Note  Program: IOP  Group Time: 9:00-10:30 am   Participation Level: Active  Behavioral Response: Appropriate  Type of Therapy:  Process Group  Summary of Progress: Patient discussed her feelings of depression and anger towards her husband.      Group Time: 10:30 am - 12:00 pm   Participation Level:  Active  Behavioral Response: Appropriate  Type of Therapy: Psycho-education Group  Summary of Progress:  Patient participated in a relaxation exercise using Progressive Muscle Relaxation as a tool to manage anxiety symptoms.   Maxcine Ham, MSW, LCSW

## 2011-07-06 ENCOUNTER — Other Ambulatory Visit (HOSPITAL_COMMUNITY): Payer: BC Managed Care – PPO

## 2011-07-06 NOTE — Progress Notes (Signed)
    Daily Group Progress Note  Program: IOP  Group Time: 9:00-10:30 am   Participation Level: Active  Behavioral Response: Appropriate  Type of Therapy:  Process Group  Summary of Progress: Patient shared current depression symptoms and shared progress they feel they are making so far in the program and received support from other members.        Group Time: 10:30 am - 12:00 pm   Participation Level:  Active  Behavioral Response: Appropriate  Type of Therapy: Psycho-education Group  Summary of Progress:   Patient participated in a goodbye ceremony for two members ending the program and expressed hopes for them going forward.   Vallery Mcdade, MSW, LCSW  
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
## 2011-07-06 NOTE — Progress Notes (Signed)
Patient ID: Latoya Clark, female   DOB: [birth year], 51 y.o.   MRN: [id number] D:  Due to patient having concerns about the medication changes that Dr. Rutherford Limerick has suggested for her, Dr. Karie Schwalbe wanted this writer to call Valinda Hoar, NP to make her aware of all the changes.  A:  Placed call to Valinda Hoar, NP at [phone] and left voicemail that pt's Depakote was discontinued, Wellbutrin 100 mg was started as a boost, 1 mg of Risperdal at hs and to continue the Lexapro 20 mg at this time.  Writer left her phone # if Sharyl Nimrod has any questions or concerns.

## 2011-07-09 ENCOUNTER — Other Ambulatory Visit (HOSPITAL_COMMUNITY): Payer: BC Managed Care – PPO | Attending: Psychiatry

## 2011-07-09 DIAGNOSIS — G4700 Insomnia, unspecified: Secondary | ICD-10-CM | POA: Insufficient documentation

## 2011-07-09 DIAGNOSIS — N644 Mastodynia: Secondary | ICD-10-CM | POA: Insufficient documentation

## 2011-07-09 DIAGNOSIS — D869 Sarcoidosis, unspecified: Secondary | ICD-10-CM | POA: Insufficient documentation

## 2011-07-09 DIAGNOSIS — M129 Arthropathy, unspecified: Secondary | ICD-10-CM | POA: Insufficient documentation

## 2011-07-09 DIAGNOSIS — F4310 Post-traumatic stress disorder, unspecified: Secondary | ICD-10-CM | POA: Insufficient documentation

## 2011-07-09 DIAGNOSIS — F411 Generalized anxiety disorder: Secondary | ICD-10-CM | POA: Insufficient documentation

## 2011-07-09 DIAGNOSIS — E039 Hypothyroidism, unspecified: Secondary | ICD-10-CM | POA: Insufficient documentation

## 2011-07-09 DIAGNOSIS — F17200 Nicotine dependence, unspecified, uncomplicated: Secondary | ICD-10-CM | POA: Insufficient documentation

## 2011-07-09 DIAGNOSIS — F332 Major depressive disorder, recurrent severe without psychotic features: Secondary | ICD-10-CM | POA: Insufficient documentation

## 2011-07-09 DIAGNOSIS — I10 Essential (primary) hypertension: Secondary | ICD-10-CM | POA: Insufficient documentation

## 2011-07-09 NOTE — Progress Notes (Signed)
Patient ID: Latoya Clark, female   DOB: 02-12-1960, 51 y.o.   MRN: [id number] Pt seen doing better, started wellbutrin, and dc depakote,  tol meds well, sleep-better, not so tired, app-low, mood and anxiety-improved.

## 2011-07-10 ENCOUNTER — Other Ambulatory Visit (HOSPITAL_COMMUNITY): Payer: BC Managed Care – PPO

## 2011-07-11 ENCOUNTER — Other Ambulatory Visit (HOSPITAL_COMMUNITY): Payer: BC Managed Care – PPO

## 2011-07-13 ENCOUNTER — Other Ambulatory Visit (HOSPITAL_COMMUNITY): Payer: BC Managed Care – PPO

## 2011-07-16 ENCOUNTER — Other Ambulatory Visit (HOSPITAL_COMMUNITY): Payer: BC Managed Care – PPO

## 2011-07-16 NOTE — Patient Instructions (Signed)
Pt requesting discharge from [facility] today due to insurance purposes.  Will follow up with Dr. Ollen Gross and Valinda Hoar, NP.  Encouraged support groups.
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
## 2011-07-16 NOTE — Progress Notes (Signed)
Patient ID: Latoya Clark, female   DOB: 13-Mar-1960, 51 y.o.   MRN: [id number] D:  This is a 51 yr old married caucasian female who was admitted due to depressive and anxiety symptoms with SI.  Pt denies any SI.  Pt is requesting discharge from [facility] due to insurance purposes.  Reports that her insurance company will only cover 50% of MH-IOP after she meets her deductible ($2,500).  According to pt on 07-11-11, she felt less anxious and depressed.  Feeling more hopeful about finding a job.  Pt has been wearing make up and her affect is brighter. Has been working on Nature conservation officer and learning coping skills for the anxiety and depression. A:  Discharge today.  Follow up with Dr. Ollen Gross and Valinda Hoar, NP.  Encouraged support groups.  R:  Pt receptive.

## 2011-07-17 ENCOUNTER — Other Ambulatory Visit (HOSPITAL_COMMUNITY): Payer: BC Managed Care – PPO

## 2011-07-18 ENCOUNTER — Other Ambulatory Visit (HOSPITAL_COMMUNITY): Payer: BC Managed Care – PPO

## 2011-07-19 ENCOUNTER — Other Ambulatory Visit (HOSPITAL_COMMUNITY): Payer: BC Managed Care – PPO

## 2011-07-20 ENCOUNTER — Other Ambulatory Visit (HOSPITAL_COMMUNITY): Payer: BC Managed Care – PPO

## 2011-07-23 ENCOUNTER — Other Ambulatory Visit (HOSPITAL_COMMUNITY): Payer: BC Managed Care – PPO

## 2011-07-24 ENCOUNTER — Other Ambulatory Visit (HOSPITAL_COMMUNITY): Payer: BC Managed Care – PPO

## 2011-07-25 ENCOUNTER — Other Ambulatory Visit (HOSPITAL_COMMUNITY): Payer: BC Managed Care – PPO

## 2011-07-26 ENCOUNTER — Other Ambulatory Visit (HOSPITAL_COMMUNITY): Payer: BC Managed Care – PPO

## 2011-07-27 ENCOUNTER — Other Ambulatory Visit: Payer: Self-pay | Admitting: Gastroenterology

## 2011-07-27 ENCOUNTER — Ambulatory Visit
Admission: RE | Admit: 2011-07-27 | Discharge: 2011-07-27 | Disposition: A | Discharge status: Home / Self Care | Payer: BC Managed Care – PPO | Source: Ambulatory Visit | Attending: Gastroenterology | Admitting: Gastroenterology

## 2011-07-27 ENCOUNTER — Other Ambulatory Visit (HOSPITAL_COMMUNITY): Payer: BC Managed Care – PPO

## 2011-07-27 ENCOUNTER — Other Ambulatory Visit (HOSPITAL_COMMUNITY): Payer: Self-pay | Admitting: Gastroenterology

## 2011-07-27 DIAGNOSIS — R109 Unspecified abdominal pain: Secondary | ICD-10-CM

## 2011-07-27 MED ORDER — IOHEXOL 300 MG/ML  SOLN
30.0000 mL | Freq: Once | INTRAMUSCULAR | Status: AC | PRN
  Administered 2011-07-27: 30 mL via INTRAVENOUS

## 2011-07-27 MED ORDER — IOHEXOL 300 MG/ML  SOLN
100.0000 mL | Freq: Once | INTRAMUSCULAR | Status: AC | PRN
  Administered 2011-07-27: 100 mL via INTRAVENOUS

## 2011-07-30 ENCOUNTER — Other Ambulatory Visit (HOSPITAL_COMMUNITY): Payer: BC Managed Care – PPO

## 2011-07-31 ENCOUNTER — Other Ambulatory Visit (HOSPITAL_COMMUNITY): Payer: BC Managed Care – PPO

## 2011-08-01 ENCOUNTER — Other Ambulatory Visit (HOSPITAL_COMMUNITY): Payer: BC Managed Care – PPO

## 2011-11-01 ENCOUNTER — Encounter: Payer: Self-pay | Admitting: Women's Health

## 2011-11-01 ENCOUNTER — Ambulatory Visit (INDEPENDENT_AMBULATORY_CARE_PROVIDER_SITE_OTHER): Payer: BC Managed Care – PPO | Admitting: Women's Health

## 2011-11-01 VITALS — BP 110/82 | Ht 63.5 in | Wt 150.0 lb

## 2011-11-01 DIAGNOSIS — N898 Other specified noninflammatory disorders of vagina: Secondary | ICD-10-CM

## 2011-11-01 DIAGNOSIS — Z30432 Encounter for removal of intrauterine contraceptive device: Secondary | ICD-10-CM

## 2011-11-01 DIAGNOSIS — Z01419 Encounter for gynecological examination (general) (routine) without abnormal findings: Secondary | ICD-10-CM

## 2011-11-01 DIAGNOSIS — N949 Unspecified condition associated with female genital organs and menstrual cycle: Secondary | ICD-10-CM

## 2011-11-01 DIAGNOSIS — N899 Noninflammatory disorder of vagina, unspecified: Secondary | ICD-10-CM

## 2011-11-01 DIAGNOSIS — Z23 Encounter for immunization: Secondary | ICD-10-CM

## 2011-11-01 LAB — URINALYSIS W MICROSCOPIC + REFLEX CULTURE
Bilirubin Urine: NEGATIVE
Glucose, UA: NEGATIVE mg/dL
Hgb urine dipstick: NEGATIVE
Ketones, ur: NEGATIVE mg/dL
Nitrite: NEGATIVE
Specific Gravity, Urine: 1.01 (ref 1.005–1.030)
pH: 7 (ref 5.0–8.0)

## 2011-11-01 LAB — CBC WITH DIFFERENTIAL/PLATELET
Eosinophils Absolute: 0 10*3/uL (ref 0.0–0.7)
Eosinophils Relative: 0 % (ref 0–5)
HCT: 44.2 % (ref 36.0–46.0)
Hemoglobin: 14.8 g/dL (ref 12.0–15.0)
Lymphocytes Relative: 28 % (ref 12–46)
Lymphs Abs: 3.2 10*3/uL (ref 0.7–4.0)
MCH: 30.5 pg (ref 26.0–34.0)
MCV: 91.1 fL (ref 78.0–100.0)
Monocytes Absolute: 0.4 10*3/uL (ref 0.1–1.0)
Monocytes Relative: 4 % (ref 3–12)
Platelets: 275 10*3/uL (ref 150–400)
RBC: 4.85 MIL/uL (ref 3.87–5.11)
WBC: 11.3 10*3/uL — ABNORMAL HIGH (ref 4.0–10.5)

## 2011-11-01 MED ORDER — NYSTATIN-TRIAMCINOLONE 100000-0.1 UNIT/GM-% EX OINT: TOPICAL_OINTMENT | Freq: Two times a day (BID) | CUTANEOUS | Status: DC

## 2011-11-01 NOTE — Progress Notes (Signed)
Latoya Clark [birth year] [id number]    History:    The patient presents for annual exam. Postmenopausal with no bleeding. Mirena IUD 2/09. Elevated FSH 06/15/2010. Osteopenic T score -1.15 January 2011. Colonoscopy 2009-06-14 colitis noted, no polyps. States is not doing well mentally, very depressed, father died in 06-07-13and has lost approximately 50 pounds in the past 6 months. Numerous mental health problems in the family. Seeing a psychiatrist and counselor monthly with numerous medication adjustments without much help. History of hypertension, arthritis, sarcoid, hypothyroidism. Right knee replacement 15-Jun-2010. Reports normal TSH at primary care. History of normal Paps and mammograms.   Past medical history, past surgical history, family history and social history were all reviewed and documented in the EPIC chart. Sister with mental health problems, homeless and now living with [place]. Daughters Lurena Joiner and Turkey living in [place]. Lurena Joiner planning wedding next year.   ROS:  A  ROS was performed and pertinent positives and negatives are included in the history.  Exam:  Filed Vitals:   11/01/11 1151  BP: 110/82    General appearance:  Affect flat, appears unhappy  Head/Neck:  Normal, without cervical or supraclavicular adenopathy. Thyroid:  Symmetrical, normal in size, without palpable masses or nodularity. Respiratory  Effort:  Normal  Auscultation:  Clear without wheezing or rhonchi Cardiovascular  Auscultation:  Regular rate, without rubs, murmurs or gallops  Edema/varicosities:  Not grossly evident Abdominal  Soft,nontender, without masses, guarding or rebound.  Liver/spleen:  No organomegaly noted  Hernia:  None appreciated  Skin  Inspection:  Grossly normal  Palpation:  Grossly normal Neurologic/psychiatric  Orientation:  Normal with appropriate conversation.  Mood/affect:  Flat, unhappy   Genitourinary    Breasts: Examined lying and sitting.     Right: Without masses,  retractions, discharge or axillary adenopathy.     Left: Without masses, retractions, discharge or axillary adenopathy.   Inguinal/mons:  Normal without inguinal adenopathy  External genitalia:  Normal  BUS/Urethra/Skene's glands:  Normal  Bladder:  Normal  Vagina:  Normal  Cervix:  Normal IUD strings not visible  Uterus:   normal in size, shape and contour.  Midline and mobile  Adnexa/parametria:     Rt: Without masses or tenderness.   Lt: Without masses or tenderness.  Anus and perineum: Normal  Digital rectal exam: Normal sphincter tone without palpated masses or tenderness  Assessment/Plan:  51 y.o. M. WF G2 P2  for annual exam with biggest problem depression.  Depression-psychiatrist Dr. Nolen Mu and counseling Massie Maroon with medication adjustments Hypothyroid Synthroid 100 mcg  Hypertension primary care manages Mirena IUD 02/2007 - menorrhagia/amenorrheic history of a septated uterus Smoker-one pack per day unmedicated to quit Osteopenia T score -1.7  01/2011  Plan: Continue care with psychiatrist and counselor for anxiety and depression. SBE's, continue annual mammogram. IUD strings not visible, will remove with ultrasound guidance-Dr. Lily Peer, (IUD due to be removed 2/14). Home safety, fall prevention discussed and vitamin D 2000 daily and increasing regular exercise encouraged. CBC, glucose, UA home Hemoccult card given, normal Pap 06/15/2010, reviewed new screening guidelines. Complained of slight itching at the mons pubis, slight erythema noted, Mycolog ointment twice daily. Instructed to call if no relief of symptoms.   Harrington Challenger WHNP, 1:19 PM 11/01/2011

## 2011-11-01 NOTE — Patient Instructions (Addendum)

## 2012-01-10 ENCOUNTER — Telehealth: Payer: Self-pay | Admitting: Women's Health

## 2012-01-10 NOTE — Telephone Encounter (Signed)
Telephone call, needed verification of flu vaccine given 11/01/11. [facility]

## 2012-01-10 NOTE — Telephone Encounter (Signed)
Patient asking if Harriett Sine can call her back.  Did not say what it is regarding.

## 2012-04-02 ENCOUNTER — Emergency Department (HOSPITAL_COMMUNITY): Payer: BC Managed Care – PPO

## 2012-04-02 ENCOUNTER — Emergency Department (HOSPITAL_COMMUNITY)
Admission: EM | Admit: 2012-04-02 | Discharge: 2012-04-02 | Disposition: A | Discharge status: Home / Self Care | Payer: BC Managed Care – PPO | Attending: Emergency Medicine | Admitting: Emergency Medicine

## 2012-04-02 ENCOUNTER — Encounter (HOSPITAL_COMMUNITY): Payer: Self-pay | Admitting: Emergency Medicine

## 2012-04-02 DIAGNOSIS — F3289 Other specified depressive episodes: Secondary | ICD-10-CM | POA: Insufficient documentation

## 2012-04-02 DIAGNOSIS — F329 Major depressive disorder, single episode, unspecified: Secondary | ICD-10-CM | POA: Insufficient documentation

## 2012-04-02 DIAGNOSIS — I10 Essential (primary) hypertension: Secondary | ICD-10-CM | POA: Insufficient documentation

## 2012-04-02 DIAGNOSIS — Z8739 Personal history of other diseases of the musculoskeletal system and connective tissue: Secondary | ICD-10-CM | POA: Insufficient documentation

## 2012-04-02 DIAGNOSIS — F411 Generalized anxiety disorder: Secondary | ICD-10-CM | POA: Insufficient documentation

## 2012-04-02 DIAGNOSIS — Z975 Presence of (intrauterine) contraceptive device: Secondary | ICD-10-CM | POA: Insufficient documentation

## 2012-04-02 DIAGNOSIS — M542 Cervicalgia: Secondary | ICD-10-CM

## 2012-04-02 DIAGNOSIS — G8911 Acute pain due to trauma: Secondary | ICD-10-CM | POA: Insufficient documentation

## 2012-04-02 DIAGNOSIS — M545 Low back pain, unspecified: Secondary | ICD-10-CM | POA: Insufficient documentation

## 2012-04-02 DIAGNOSIS — Z8742 Personal history of other diseases of the female genital tract: Secondary | ICD-10-CM | POA: Insufficient documentation

## 2012-04-02 DIAGNOSIS — Z8619 Personal history of other infectious and parasitic diseases: Secondary | ICD-10-CM | POA: Insufficient documentation

## 2012-04-02 DIAGNOSIS — Z79899 Other long term (current) drug therapy: Secondary | ICD-10-CM | POA: Insufficient documentation

## 2012-04-02 DIAGNOSIS — F17200 Nicotine dependence, unspecified, uncomplicated: Secondary | ICD-10-CM | POA: Insufficient documentation

## 2012-04-02 DIAGNOSIS — E039 Hypothyroidism, unspecified: Secondary | ICD-10-CM | POA: Insufficient documentation

## 2012-04-02 DIAGNOSIS — M549 Dorsalgia, unspecified: Secondary | ICD-10-CM

## 2012-04-02 DIAGNOSIS — G44309 Post-traumatic headache, unspecified, not intractable: Secondary | ICD-10-CM | POA: Insufficient documentation

## 2012-04-02 MED ORDER — IBUPROFEN 800 MG PO TABS
800.0000 mg | ORAL_TABLET | Freq: Once | ORAL | Status: AC
  Administered 2012-04-02: 800 mg via ORAL
  Filled 2012-04-02: qty 1
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
## 2012-04-04 NOTE — ED Provider Notes (Signed)
Medical screening examination/treatment/procedure(s) were performed by non-physician practitioner and as supervising physician I was immediately available for consultation/collaboration.   Nyssa Sayegh M Temara Lanum, DO 04/04/12 1919 

## 2012-04-18 ENCOUNTER — Telehealth: Payer: Self-pay

## 2012-04-18 MED ORDER — ALPRAZOLAM 0.25 MG PO TABS: ORAL_TABLET | ORAL | Status: DC

## 2012-04-18 NOTE — Telephone Encounter (Signed)
Patient has appt scheduled for IUD removal with you on 05/10/12.  She has called today asking for something for anxiety for that visit as she said she has had some complicated gyn visits previously and is anxious about this. Requesting some "Valium or something to knock me out"    Of note, upon reviewing her last visit she was seeing psychiatrist for treatment of depression and anxiety.

## 2012-04-18 NOTE — Telephone Encounter (Signed)
Please call and Xanax 0.25 mg. She can take 1 the night before and then she can take a second 1 the morning of the procedure and one more tablet that evening if she needs it. We'll prescribe 5 tablets with no refills

## 2012-04-18 NOTE — Telephone Encounter (Signed)
Patient informed. 

## 2012-05-05 ENCOUNTER — Other Ambulatory Visit: Payer: Self-pay | Admitting: Gynecology

## 2012-05-05 DIAGNOSIS — N925 Other specified irregular menstruation: Secondary | ICD-10-CM

## 2012-05-05 DIAGNOSIS — N949 Unspecified condition associated with female genital organs and menstrual cycle: Secondary | ICD-10-CM

## 2012-05-05 DIAGNOSIS — N938 Other specified abnormal uterine and vaginal bleeding: Secondary | ICD-10-CM

## 2012-05-09 ENCOUNTER — Ambulatory Visit: Payer: Self-pay | Admitting: Gynecology

## 2012-05-09 ENCOUNTER — Ambulatory Visit (INDEPENDENT_AMBULATORY_CARE_PROVIDER_SITE_OTHER): Payer: BC Managed Care – PPO | Admitting: Gynecology

## 2012-05-09 ENCOUNTER — Other Ambulatory Visit: Payer: Self-pay

## 2012-05-09 ENCOUNTER — Ambulatory Visit: Payer: BC Managed Care – PPO

## 2012-05-09 DIAGNOSIS — Z30432 Encounter for removal of intrauterine contraceptive device: Secondary | ICD-10-CM

## 2012-05-09 NOTE — Progress Notes (Signed)
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 Patient is postmenopausal. 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 We're going to proceed with doing an ultrasound retrieval of the IUD. At time of placement of the speculum the IUD string was visualized was grasped with a ring forcep was retrieved shown to the patient and discarded. Patient recently had her annual gynecological examination and will return back next year or when necessary.

## 2012-09-30 ENCOUNTER — Encounter (INDEPENDENT_AMBULATORY_CARE_PROVIDER_SITE_OTHER): Payer: Self-pay | Admitting: General Surgery

## 2012-09-30 ENCOUNTER — Ambulatory Visit (INDEPENDENT_AMBULATORY_CARE_PROVIDER_SITE_OTHER): Payer: 59 | Admitting: General Surgery

## 2012-09-30 VITALS — BP 132/88 | HR 72 | Resp 72 | Ht 63.5 in | Wt 145.6 lb

## 2012-09-30 DIAGNOSIS — K8010 Calculus of gallbladder with chronic cholecystitis without obstruction: Secondary | ICD-10-CM

## 2012-09-30 NOTE — Progress Notes (Signed)
This patient's clinic evaluation was documented as a preoperative history and physical for future surgery.
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
## 2012-10-02 ENCOUNTER — Telehealth (INDEPENDENT_AMBULATORY_CARE_PROVIDER_SITE_OTHER): Payer: Self-pay | Admitting: *Deleted

## 2012-10-02 ENCOUNTER — Telehealth (INDEPENDENT_AMBULATORY_CARE_PROVIDER_SITE_OTHER): Payer: Self-pay | Admitting: General Surgery

## 2012-10-02 NOTE — Telephone Encounter (Signed)
Daughter and patient called to ask how they could get patient's surgery scheduled for tomorrow.  Patient states that she needs to leave for the beach on [place] 9[place] so having surgery on October 2nd which is when surgery schedulers offered her, patient states would not give her enough recovery time.  Explained to daughter and patient that there are two options: either schedule surgery with Dr. Lindie Spruce on October 2nd which is first available or if she is having that much pain she can go to the ED and be evaluated.  Explained to patient and daughter that if they go to the ED then it would be up to the ED physician to determine if it was life threatening and if emergency surgery was appropriate.  Tried to explain that pain unfortunately does not equate for an emergency surgery to be performed.  Daughter then started asking about another surgeon doing the surgery.  Explained that if they wanted another surgeon then patient would need to come back in for a new patient appt with a different surgeon then they could speak to surgery schedulers regarding that surgeons OR schedule but advised this was risky since most surgeons are scheduling 2-4 weeks out.  Daughter stated again how can we get my mom's surgery tomorrow that way she has enough time to recover before we go to the beach.  Again explained there are only two options.  Daughter started asking about having patient direct admitted since she is having pain, explained that we do not direct admit patient's just for pain and again they would need to go to the ED and be assessed if there are pain control issues.  Patient became frustrated that I could not offer her a surgery date of tomorrow and began talking about going back to [place].  Tried to explain to patient that all I have are these 2 options, either be evaluated to see if this has become a life threatening issues in the ED or go ahead and schedule for October 2nd.  Daughter states she will speak with her mother  to determine the best plan for them.

## 2012-10-02 NOTE — Telephone Encounter (Signed)
Pt of Dr. Lindie Spruce called to see if [facility] has received her medical records from [facility] in [place].  Spoke with Dr. Dixon Boos assistant, who stated the records have been received and her orders for surgery have been sent to the schedulers.  Pt update via voice message with same.

## 2012-10-03 ENCOUNTER — Telehealth (INDEPENDENT_AMBULATORY_CARE_PROVIDER_SITE_OTHER): Payer: Self-pay | Admitting: General Surgery

## 2012-10-03 NOTE — Telephone Encounter (Signed)
Pt transferred to me from medical records. Daughter was requesting medical records as pt was in ED in [place]/ daughter was speaking for mother (patient) as she was in too much pain.  Wanted sx 9/26 with Dr wyatt explained not available however can schedule 10/2 would be at [facility] main as Dr Lindie Spruce requested patient was crying and yelling I don't want to go to [facility] tell that doctor to go to [facility].  Told he was trauma surgeon and spent considerable time at [facility] and felt in pt best interest.   They wanted me to get another surgeon to do surgery now so that pt can make daughters [place] 10[place]18/12 [place].  Offered ED or 10/2 transferred to nurse triage for pain pt to call back if wants 10/2 date knows could be unavailable if waits too long

## 2012-10-09 ENCOUNTER — Ambulatory Visit (INDEPENDENT_AMBULATORY_CARE_PROVIDER_SITE_OTHER): Payer: Self-pay | Admitting: General Surgery

## 2012-12-10 ENCOUNTER — Encounter (INDEPENDENT_AMBULATORY_CARE_PROVIDER_SITE_OTHER): Payer: Self-pay

## 2012-12-10 ENCOUNTER — Ambulatory Visit (INDEPENDENT_AMBULATORY_CARE_PROVIDER_SITE_OTHER): Payer: 59 | Admitting: General Surgery

## 2012-12-10 ENCOUNTER — Encounter (INDEPENDENT_AMBULATORY_CARE_PROVIDER_SITE_OTHER): Payer: Self-pay | Admitting: General Surgery

## 2012-12-10 VITALS — BP 116/72 | HR 68 | Temp 97.6°F | Resp 16 | Ht 63.5 in | Wt 160.0 lb

## 2012-12-10 DIAGNOSIS — K8010 Calculus of gallbladder with chronic cholecystitis without obstruction: Secondary | ICD-10-CM

## 2012-12-10 DIAGNOSIS — K828 Other specified diseases of gallbladder: Secondary | ICD-10-CM

## 2012-12-10 NOTE — Patient Instructions (Signed)
Please call dr Evette Cristal (& let my office know as well) if your left sided abdominal pain worsens or if you develop fever. Take antibiotics for diverticulitis

## 2012-12-15 NOTE — Progress Notes (Signed)
Patient ID: Latoya Clark, female   DOB: 11/16/1960, 52 y.o.   MRN: [id number]  CC: abd pain  HPI Latoya Clark is a 52 y.o. female.   HPI 52-year-old Caucasian female referred by Dr. Ganem for evaluation for possible cholecystectomy. The patient had previously seen one of my partners a few weeks ago and was scheduled for surgery. However we were not able to accommodate the specific date that they had requested therefore we canceled surgery. She states that she has seen her for second opinion about her gallbladder. She apparently was admitted to [facility] back in September with severe epigastric and right upper quadrant pain.  She states that it felt like she was having a heart attack. Was associated with nausea. She was admitted and a cardiac workup was negative. She underwent abdominal ultrasound and hiatus scan. Per the electronic medical record through care everywhere She initially had had some mildly elevated AST and ALTs however they steadily increased. On day of discharge they were starting to trend down but still elevated. She denies having any ongoing severe pain that prompted her to go to the hospital. She does have some nonspecific right-sided abdominal pain at times since then. Most recently she developed some left-sided abdominal pain and saw Dr. Ganem and was placed on antibiotics for diverticulitis 2 days ago. She is currently taking Cipro and Flagyl. Past Medical History  Diagnosis Date  . Hypertension   . Arthritis   . Active smoker     1 PPD  . Depression   . Sarcoid   . Mastodynia   . Vaginal septum   . Insomnia   . Hypothyroid   . IUD 02/13/07    MIRENA  . Anxiety     Past Surgical History  Procedure Laterality Date  . Cesarean section  1981, 1984  . Thyroidectomy  2002  . Copd    . Hysteroscopy  02/13/07    HYSTEROSCOPY, D&C, MIRENA INSERTED  . 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 Depression Mother     . Anxiety disorder Mother   . Depression Father   . Cancer Father     angioplasty thyroid carcenoma  . Diabetes Father   . Hypertension Father   . Thyroid disease Father   . Bipolar disorder Sister   . Hypertension Sister   . Schizophrenia Maternal Grandfather   . Depression Maternal Grandmother   . Anxiety disorder Maternal Grandmother   . Anxiety disorder Paternal Grandfather   . Depression Paternal Grandfather   . Diabetes Paternal Grandfather   . Anxiety disorder Paternal Grandmother   . Depression Paternal Grandmother   . Cancer Paternal Grandmother     stomach  . Diabetes Paternal Grandmother     Social History History  Substance Use Topics  . Smoking status: Current Every Day Smoker -- 1.00 packs/day for 15 years    Types: Cigarettes  . Smokeless tobacco: Never Used  . Alcohol Use: No    Allergies  Allergen Reactions  . Codeine     Headache.   . Morphine     Headache. // patient denies having allergy to medication  . Prednisone     Widespread pain throughout body.     Current Outpatient Prescriptions  Medication Sig Dispense Refill  . budesonide (ENTOCORT EC) 3 MG 24 hr capsule Take 6 mg by mouth every morning.        . buPROPion (WELLBUTRIN XL) 300   MG 24 hr tablet Take 300 mg by mouth daily.      . ciprofloxacin (CIPRO) 500 MG tablet       . clonazePAM (KLONOPIN) 1 MG tablet Take 1 mg by mouth 3 (three) times daily as needed for anxiety.       . divalproex (DEPAKOTE) 250 MG DR tablet Take 250 mg by mouth 3 (three) times daily.      . escitalopram (LEXAPRO) 20 MG tablet Take 20 mg by mouth daily.      . levothyroxine (SYNTHROID, LEVOTHROID) 88 MCG tablet Take 88 mcg by mouth daily.      . metroNIDAZOLE (FLAGYL) 500 MG tablet       . escitalopram (LEXAPRO) 20 MG tablet Take 1 tablet (20 mg total) by mouth every morning.  30 tablet  0   No current facility-administered medications for this visit.    Review of Systems Review of Systems  Constitutional:  Negative for fever, activity change, appetite change and unexpected weight change.  HENT: Negative for nosebleeds and trouble swallowing.   Eyes: Negative for photophobia and visual disturbance.  Respiratory: Negative for chest tightness and shortness of breath.   Cardiovascular: Negative for chest pain and leg swelling.       Denies CP, SOB, orthopnea, PND, DOE  Gastrointestinal: Positive for nausea and abdominal pain.  Genitourinary: Negative for dysuria and difficulty urinating.  Musculoskeletal: Negative for arthralgias.  Skin: Negative for pallor and rash.  Neurological: Negative for dizziness, seizures, facial asymmetry and numbness.       Denies TIA and amaurosis fugax   Hematological: Negative for adenopathy. Does not bruise/bleed easily.  Psychiatric/Behavioral: Negative for behavioral problems and agitation.    Blood pressure 116/72, pulse 68, temperature 97.6 F (36.4 C), temperature source Temporal, resp. rate 16, height 5' 3.5" (1.613 m), weight 160 lb (72.576 kg).  Physical Exam Physical Exam  Vitals reviewed. Constitutional: She is oriented to person, place, and time. She appears well-developed and well-nourished. No distress.  HENT:  Head: Normocephalic and atraumatic.  Right Ear: External ear normal.  Left Ear: External ear normal.  Eyes: Conjunctivae are normal. No scleral icterus.  Neck: Normal range of motion. Neck supple. No tracheal deviation present. No thyromegaly present.  Cardiovascular: Normal rate and normal heart sounds.   Pulmonary/Chest: Effort normal and breath sounds normal. No stridor. No respiratory distress. She has no wheezes.  Abdominal: Soft. She exhibits no distension. There is no tenderness. There is no rebound.    Musculoskeletal: She exhibits no edema and no tenderness.  Lymphadenopathy:    She has no cervical adenopathy.  Neurological: She is alert and oriented to person, place, and time. She exhibits normal muscle tone.  Skin: Skin  is warm and dry. No rash noted. She is not diaphoretic. No erythema.  Psychiatric: She has a normal mood and affect. Her behavior is normal. Judgment and thought content normal.    Data Reviewed Dr Wyatt's note D/c summary from [facility] via Care everywhere 9/21 - negative cardiac workup, AST 1157 -->1024, ALT 1714 -->726; u/s - cholesterol polyps only HIDA scan - EF 0%  Assessment    Biliary dyskinesia Elevated LFTs  Diverticulitis    Plan    I believe the patient's symptoms she had in September are consistent with gallbladder disease.  We discussed gallbladder disease. The patient was given educational material. We discussed non-operative and operative management. We discussed the signs & symptoms of acute cholecystitis  I discussed laparoscopic cholecystectomy with IOC   in detail.  The patient was given educational material as well as diagrams detailing the procedure.  We discussed the risks and benefits of a laparoscopic cholecystectomy including, but not limited to bleeding, infection, injury to surrounding structures such as the intestine or liver, bile leak, retained gallstones, need to convert to an open procedure, prolonged diarrhea, blood clots such as  DVT, common bile duct injury, anesthesia risks, and possible need for additional procedures.  We discussed the typical post-operative recovery course. I explained that the likelihood of improvement of their symptoms is good.  I explained that I would want to wait to do her gallbladder surgery until her diverticulitis spell has resolved. Therefore we will not schedule her surgery any earlier than 2 weeks from now.   We also discussed her elevated LFTs. I explained that we would repeat them preoperatively and if they were still elevated then we would probably need to cancel surgery so that she could have her transaminitis evaluated  Grayce Budden M. Osby Sweetin, MD, FACS General, Bariatric, & Minimally Invasive Surgery [facility] [place]          Bardia Wangerin M 12/15/2012, 4:24 PM    

## 2012-12-24 ENCOUNTER — Encounter (HOSPITAL_COMMUNITY): Payer: Self-pay | Admitting: Pharmacy Technician

## 2012-12-26 ENCOUNTER — Encounter (HOSPITAL_COMMUNITY)
Admission: RE | Admit: 2012-12-26 | Discharge: 2012-12-26 | Disposition: A | Discharge status: Home / Self Care | Payer: 59 | Source: Ambulatory Visit | Attending: General Surgery | Admitting: General Surgery

## 2012-12-26 ENCOUNTER — Ambulatory Visit (HOSPITAL_COMMUNITY)
Admission: RE | Admit: 2012-12-26 | Discharge: 2012-12-26 | Disposition: A | Discharge status: Home / Self Care | Payer: 59 | Source: Ambulatory Visit | Attending: Anesthesiology | Admitting: Anesthesiology

## 2012-12-26 ENCOUNTER — Encounter (HOSPITAL_COMMUNITY): Payer: Self-pay

## 2012-12-26 DIAGNOSIS — Z01812 Encounter for preprocedural laboratory examination: Secondary | ICD-10-CM | POA: Insufficient documentation

## 2012-12-26 DIAGNOSIS — IMO0002 Reserved for concepts with insufficient information to code with codable children: Secondary | ICD-10-CM | POA: Insufficient documentation

## 2012-12-26 DIAGNOSIS — Z01818 Encounter for other preprocedural examination: Secondary | ICD-10-CM | POA: Insufficient documentation

## 2012-12-26 DIAGNOSIS — K828 Other specified diseases of gallbladder: Secondary | ICD-10-CM | POA: Insufficient documentation

## 2012-12-26 DIAGNOSIS — Z01810 Encounter for preprocedural cardiovascular examination: Secondary | ICD-10-CM | POA: Insufficient documentation

## 2012-12-26 HISTORY — DX: Urinary tract infection, site not specified: N39.0

## 2012-12-26 LAB — COMPREHENSIVE METABOLIC PANEL
Albumin: 3.8 g/dL (ref 3.5–5.2)
BUN: 16 mg/dL (ref 6–23)
CO2: 28 mEq/L (ref 19–32)
Calcium: 8.6 mg/dL (ref 8.4–10.5)
GFR calc Af Amer: >90 mL/min (ref >90)
GFR calc non Af Amer: 82 mL/min — ABNORMAL LOW (ref >90)
Glucose, Bld: 77 mg/dL (ref 70–99)
Sodium: 137 mEq/L (ref 135–145)
Total Protein: 7 g/dL (ref 6.0–8.3)

## 2012-12-26 LAB — CBC WITH DIFFERENTIAL/PLATELET
Basophils Relative: 0 % (ref 0–1)
Eosinophils Absolute: 0.1 10*3/uL (ref 0.0–0.7)
Eosinophils Relative: 1 % (ref 0–5)
HCT: 43 % (ref 36.0–46.0)
Hemoglobin: 14.9 g/dL (ref 12.0–15.0)
Lymphs Abs: 3.9 10*3/uL (ref 0.7–4.0)
MCH: 32.1 pg (ref 26.0–34.0)
MCV: 92.7 fL (ref 78.0–100.0)
Monocytes Absolute: 0.6 10*3/uL (ref 0.1–1.0)
Monocytes Relative: 7 % (ref 3–12)
Neutrophils Relative %: 45 % (ref 43–77)
Platelets: 229 10*3/uL (ref 150–400)
RBC: 4.64 MIL/uL (ref 3.87–5.11)

## 2012-12-26 NOTE — Pre-Procedure Instructions (Addendum)
Latoya Clark  12/26/2012   Your procedure is scheduled on:  01/05/13  Report to [facility] cone short stay admitting at 830 AM.  Call this number if you have problems the morning of surgery: [phone]   Remember:   Do not eat food or drink liquids after midnight.   Take these medicines the morning of surgery with A SIP OF WATER: budesonide, wellbutrin, clonazepam,lexapro,levothyroxine          STOP all herbel meds, nsaids (aleve,naproxen,advil,ibuprofen) 5 days prior to surgery 12/31/12    Do not wear jewelry, make-up or nail polish.  Do not wear lotions, powders, or perfumes. You may wear deodorant.  Do not shave 48 hours prior to surgery. Men may shave face and neck.  Do not bring valuables to the hospital.  [facility] is not responsible                  for any belongings or valuables.               Contacts, dentures or bridgework may not be worn into surgery.  Leave suitcase in the car. After surgery it may be brought to your room.  For patients admitted to the hospital, discharge time is determined by your                treatment team.               Patients discharged the day of surgery will not be allowed to drive  home.  Name and phone number of your driver:   Special Instructions: Shower using CHG 2 nights before surgery and the night before surgery.  If you shower the day of surgery use CHG.  Use special wash - you have one bottle of CHG for all showers.  You should use approximately 1/3 of the bottle for each shower.   Please read over the following fact sheets that you were given: Pain Booklet, Coughing and Deep Breathing and Surgical Site Infection Prevention

## 2013-01-04 MED ORDER — DEXTROSE 5 % IV SOLN
2.0000 g | INTRAVENOUS | Status: AC
  Administered 2013-01-05: 2 g via INTRAVENOUS
  Filled 2013-01-04: qty 2

## 2013-01-05 ENCOUNTER — Encounter (HOSPITAL_COMMUNITY)
Admission: RE | Disposition: A | Discharge status: Home / Self Care | Payer: Self-pay | Source: Ambulatory Visit | Attending: General Surgery

## 2013-01-05 ENCOUNTER — Encounter (HOSPITAL_COMMUNITY): Payer: Self-pay | Admitting: Anesthesiology

## 2013-01-05 ENCOUNTER — Ambulatory Visit (HOSPITAL_COMMUNITY)
Admission: RE | Admit: 2013-01-05 | Discharge: 2013-01-05 | Disposition: A | Discharge status: Home / Self Care | Payer: 59 | Source: Ambulatory Visit | Attending: General Surgery | Admitting: General Surgery

## 2013-01-05 ENCOUNTER — Encounter (HOSPITAL_COMMUNITY): Payer: 59 | Admitting: Anesthesiology

## 2013-01-05 ENCOUNTER — Ambulatory Visit (HOSPITAL_COMMUNITY): Payer: 59 | Admitting: Anesthesiology

## 2013-01-05 DIAGNOSIS — F3289 Other specified depressive episodes: Secondary | ICD-10-CM | POA: Insufficient documentation

## 2013-01-05 DIAGNOSIS — K829 Disease of gallbladder, unspecified: Secondary | ICD-10-CM | POA: Diagnosis present

## 2013-01-05 DIAGNOSIS — K828 Other specified diseases of gallbladder: Secondary | ICD-10-CM | POA: Insufficient documentation

## 2013-01-05 DIAGNOSIS — F329 Major depressive disorder, single episode, unspecified: Secondary | ICD-10-CM | POA: Insufficient documentation

## 2013-01-05 DIAGNOSIS — F411 Generalized anxiety disorder: Secondary | ICD-10-CM | POA: Diagnosis not present

## 2013-01-05 DIAGNOSIS — R51 Headache: Secondary | ICD-10-CM | POA: Insufficient documentation

## 2013-01-05 DIAGNOSIS — F17200 Nicotine dependence, unspecified, uncomplicated: Secondary | ICD-10-CM | POA: Insufficient documentation

## 2013-01-05 DIAGNOSIS — K811 Chronic cholecystitis: Secondary | ICD-10-CM | POA: Diagnosis not present

## 2013-01-05 DIAGNOSIS — J4489 Other specified chronic obstructive pulmonary disease: Secondary | ICD-10-CM | POA: Insufficient documentation

## 2013-01-05 DIAGNOSIS — J449 Chronic obstructive pulmonary disease, unspecified: Secondary | ICD-10-CM | POA: Insufficient documentation

## 2013-01-05 DIAGNOSIS — I10 Essential (primary) hypertension: Secondary | ICD-10-CM | POA: Insufficient documentation

## 2013-01-05 HISTORY — PX: CHOLECYSTECTOMY: SHX55

## 2013-01-05 SURGERY — LAPAROSCOPIC CHOLECYSTECTOMY WITH INTRAOPERATIVE CHOLANGIOGRAM
Anesthesia: General

## 2013-01-05 MED ORDER — 0.9 % SODIUM CHLORIDE (POUR BTL) OPTIME
TOPICAL | Status: DC | PRN
  Administered 2013-01-05: 1000 mL

## 2013-01-05 MED ORDER — GLYCOPYRROLATE 0.2 MG/ML IJ SOLN
INTRAMUSCULAR | Status: DC | PRN
  Administered 2013-01-05: 0.6 mg via INTRAVENOUS
  Administered 2013-01-05: 0.2 mg via INTRAVENOUS

## 2013-01-05 MED ORDER — ONDANSETRON HCL 4 MG/2ML IJ SOLN
INTRAMUSCULAR | Status: DC | PRN
  Administered 2013-01-05: 4 mg via INTRAVENOUS

## 2013-01-05 MED ORDER — SODIUM CHLORIDE 0.9 % IV SOLN: 250.0000 mL | INTRAVENOUS | Status: DC | PRN

## 2013-01-05 MED ORDER — SODIUM CHLORIDE 0.9 % IR SOLN
Status: DC | PRN
  Administered 2013-01-05: 1000 mL

## 2013-01-05 MED ORDER — EPHEDRINE SULFATE 50 MG/ML IJ SOLN
INTRAMUSCULAR | Status: DC | PRN
  Administered 2013-01-05: 10 mg via INTRAVENOUS

## 2013-01-05 MED ORDER — PROPOFOL 10 MG/ML IV BOLUS
INTRAVENOUS | Status: DC | PRN
  Administered 2013-01-05: 10 mg via INTRAVENOUS
  Administered 2013-01-05: 150 mg via INTRAVENOUS

## 2013-01-05 MED ORDER — NEOSTIGMINE METHYLSULFATE 1 MG/ML IJ SOLN
INTRAMUSCULAR | Status: DC | PRN
  Administered 2013-01-05: 4 mg via INTRAVENOUS

## 2013-01-05 MED ORDER — LACTATED RINGERS IV SOLN
INTRAVENOUS | Status: DC
  Administered 2013-01-05: 09:00:00 via INTRAVENOUS

## 2013-01-05 MED ORDER — ONDANSETRON HCL 4 MG/2ML IJ SOLN: 4.0000 mg | Freq: Four times a day (QID) | INTRAMUSCULAR | Status: DC | PRN

## 2013-01-05 MED ORDER — SODIUM CHLORIDE 0.9 % IJ SOLN: 3.0000 mL | INTRAMUSCULAR | Status: DC | PRN

## 2013-01-05 MED ORDER — ONDANSETRON HCL 4 MG PO TABS: 4.0000 mg | ORAL_TABLET | Freq: Three times a day (TID) | ORAL | Status: DC | PRN

## 2013-01-05 MED ORDER — LACTATED RINGERS IV SOLN
INTRAVENOUS | Status: DC | PRN
  Administered 2013-01-05 (×2): via INTRAVENOUS

## 2013-01-05 MED ORDER — OXYCODONE HCL 5 MG PO TABS: 5.0000 mg | ORAL_TABLET | ORAL | Status: DC | PRN

## 2013-01-05 MED ORDER — FENTANYL CITRATE 0.05 MG/ML IJ SOLN
INTRAMUSCULAR | Status: AC
  Filled 2013-01-05: qty 2

## 2013-01-05 MED ORDER — ACETAMINOPHEN 325 MG PO TABS: 650.0000 mg | ORAL_TABLET | ORAL | Status: DC | PRN

## 2013-01-05 MED ORDER — ARTIFICIAL TEARS OP OINT
TOPICAL_OINTMENT | OPHTHALMIC | Status: DC | PRN
  Administered 2013-01-05: 1 via OPHTHALMIC

## 2013-01-05 MED ORDER — MORPHINE SULFATE 2 MG/ML IJ SOLN: 1.0000 mg | INTRAMUSCULAR | Status: DC | PRN

## 2013-01-05 MED ORDER — OXYCODONE-ACETAMINOPHEN 5-325 MG PO TABS: 1.0000 | ORAL_TABLET | ORAL | Status: DC | PRN

## 2013-01-05 MED ORDER — SODIUM CHLORIDE 0.9 % IJ SOLN: 3.0000 mL | Freq: Two times a day (BID) | INTRAMUSCULAR | Status: DC

## 2013-01-05 MED ORDER — SODIUM CHLORIDE 0.9 % IV SOLN: INTRAVENOUS | Status: DC

## 2013-01-05 MED ORDER — ACETAMINOPHEN 650 MG RE SUPP: 650.0000 mg | RECTAL | Status: DC | PRN

## 2013-01-05 MED ORDER — FENTANYL CITRATE 0.05 MG/ML IJ SOLN
25.0000 ug | INTRAMUSCULAR | Status: DC | PRN
  Administered 2013-01-05: 50 ug via INTRAVENOUS

## 2013-01-05 MED ORDER — LIDOCAINE HCL (CARDIAC) 20 MG/ML IV SOLN
INTRAVENOUS | Status: DC | PRN
  Administered 2013-01-05: 60 mg via INTRAVENOUS

## 2013-01-05 MED ORDER — FENTANYL CITRATE 0.05 MG/ML IJ SOLN
INTRAMUSCULAR | Status: DC | PRN
  Administered 2013-01-05: 50 ug via INTRAVENOUS
  Administered 2013-01-05: 100 ug via INTRAVENOUS
  Administered 2013-01-05: 25 ug via INTRAVENOUS

## 2013-01-05 MED ORDER — BUPIVACAINE-EPINEPHRINE 0.25% -1:200000 IJ SOLN
INTRAMUSCULAR | Status: DC | PRN
  Administered 2013-01-05: 28 mL

## 2013-01-05 MED ORDER — ROCURONIUM BROMIDE 100 MG/10ML IV SOLN
INTRAVENOUS | Status: DC | PRN
  Administered 2013-01-05: 40 mg via INTRAVENOUS

## 2013-01-05 SURGICAL SUPPLY — 47 items
APL SKNCLS STERI-STRIP NONHPOA (GAUZE/BANDAGES/DRESSINGS) ×1
APPLIER CLIP 5 13 M/L LIGAMAX5 (MISCELLANEOUS) ×2
APR CLP MED LRG 5 ANG JAW (MISCELLANEOUS) ×1
BAG SPEC RTRVL LRG 6X4 10 (ENDOMECHANICALS) ×1
BANDAGE ADHESIVE 1X3 (GAUZE/BANDAGES/DRESSINGS) ×6 IMPLANT
BENZOIN TINCTURE PRP APPL 2/3 (GAUZE/BANDAGES/DRESSINGS) ×2 IMPLANT
BLADE SURG ROTATE 9660 (MISCELLANEOUS) IMPLANT
CANISTER SUCTION 2500CC (MISCELLANEOUS) ×2 IMPLANT
CHLORAPREP W/TINT 26ML (MISCELLANEOUS) ×2 IMPLANT
CLIP APPLIE 5 13 M/L LIGAMAX5 (MISCELLANEOUS) ×1 IMPLANT
COVER MAYO STAND STRL (DRAPES) ×2 IMPLANT
COVER SURGICAL LIGHT HANDLE (MISCELLANEOUS) ×2 IMPLANT
DECANTER SPIKE VIAL GLASS SM (MISCELLANEOUS) ×2 IMPLANT
DRAPE C-ARM 42X72 X-RAY (DRAPES) ×2 IMPLANT
DRAPE UTILITY 15X26 W/TAPE STR (DRAPE) ×4 IMPLANT
DRSG TEGADERM 2-3/8X2-3/4 SM (GAUZE/BANDAGES/DRESSINGS) ×1 IMPLANT
DRSG TEGADERM 4X4.75 (GAUZE/BANDAGES/DRESSINGS) ×2 IMPLANT
ELECT REM PT RETURN 9FT ADLT (ELECTROSURGICAL) ×2
ELECTRODE REM PT RTRN 9FT ADLT (ELECTROSURGICAL) ×1 IMPLANT
GAUZE SPONGE 2X2 8PLY STRL LF (GAUZE/BANDAGES/DRESSINGS) ×1 IMPLANT
GLOVE BIO SURGEON STRL SZ 6.5 (GLOVE) ×1 IMPLANT
GLOVE BIOGEL M STRL SZ7.5 (GLOVE) ×2 IMPLANT
GLOVE BIOGEL PI IND STRL 7.0 (GLOVE) IMPLANT
GLOVE BIOGEL PI IND STRL 8 (GLOVE) ×1 IMPLANT
GLOVE BIOGEL PI INDICATOR 7.0 (GLOVE) ×3
GLOVE BIOGEL PI INDICATOR 8 (GLOVE) ×1
GLOVE SURG SS PI 7.0 STRL IVOR (GLOVE) ×1 IMPLANT
GOWN STRL NON-REIN LRG LVL3 (GOWN DISPOSABLE) ×7 IMPLANT
GOWN STRL REIN XL XLG (GOWN DISPOSABLE) ×2 IMPLANT
KIT BASIN OR (CUSTOM PROCEDURE TRAY) ×2 IMPLANT
KIT ROOM TURNOVER OR (KITS) ×2 IMPLANT
NS IRRIG 1000ML POUR BTL (IV SOLUTION) ×2 IMPLANT
PAD ARMBOARD 7.5X6 YLW CONV (MISCELLANEOUS) ×2 IMPLANT
POUCH SPECIMEN RETRIEVAL 10MM (ENDOMECHANICALS) ×2 IMPLANT
SCISSORS LAP 5X35 DISP (ENDOMECHANICALS) ×2 IMPLANT
SET CHOLANGIOGRAPH 5 50 .035 (SET/KITS/TRAYS/PACK) ×2 IMPLANT
SET IRRIG TUBING LAPAROSCOPIC (IRRIGATION / IRRIGATOR) ×2 IMPLANT
SLEEVE ENDOPATH XCEL 5M (ENDOMECHANICALS) ×4 IMPLANT
SPECIMEN JAR SMALL (MISCELLANEOUS) ×2 IMPLANT
SPONGE GAUZE 2X2 STER 10/PKG (GAUZE/BANDAGES/DRESSINGS) ×1
STRIP CLOSURE SKIN 1/2X4 (GAUZE/BANDAGES/DRESSINGS) ×1 IMPLANT
SUT MNCRL AB 4-0 PS2 18 (SUTURE) ×2 IMPLANT
TOWEL OR 17X24 6PK STRL BLUE (TOWEL DISPOSABLE) ×2 IMPLANT
TOWEL OR 17X26 10 PK STRL BLUE (TOWEL DISPOSABLE) ×2 IMPLANT
TRAY LAPAROSCOPIC (CUSTOM PROCEDURE TRAY) ×2 IMPLANT
TROCAR XCEL BLUNT TIP 100MML (ENDOMECHANICALS) ×2 IMPLANT
TROCAR XCEL NON-BLD 5MMX100MML (ENDOMECHANICALS) ×2 IMPLANT

## 2013-01-05 NOTE — Anesthesia Preprocedure Evaluation (Addendum)
Anesthesia Evaluation  Patient identified by MRN, date of birth, ID band Patient awake    Reviewed: Allergy & Precautions, H&P , NPO status , Patient's Chart, lab work & pertinent test results  Airway Mallampati: II      Dental   Pulmonary COPDCurrent Smoker,  breath sounds clear to auscultation        Cardiovascular hypertension, Rhythm:Regular Rate:Normal     Neuro/Psych  Headaches, Anxiety Depression    GI/Hepatic negative GI ROS, Neg liver ROS,   Endo/Other  negative endocrine ROSHypothyroidism   Renal/GU negative Renal ROS     Musculoskeletal   Abdominal   Peds  Hematology   Anesthesia Other Findings   Reproductive/Obstetrics                          Anesthesia Physical Anesthesia Plan  ASA: II  Anesthesia Plan: General   Post-op Pain Management:    Induction: Intravenous  Airway Management Planned: Oral ETT  Additional Equipment:   Intra-op Plan:   Post-operative Plan: Extubation in OR  Informed Consent: I have reviewed the patients History and Physical, chart, labs and discussed the procedure including the risks, benefits and alternatives for the proposed anesthesia with the patient or authorized representative who has indicated his/her understanding and acceptance.   Dental advisory given  Plan Discussed with: CRNA, Anesthesiologist and Surgeon  Anesthesia Plan Comments:         Anesthesia Quick Evaluation

## 2013-01-05 NOTE — Interval H&P Note (Signed)
History and Physical Interval Note:  01/05/2013 10:39 AM  Latoya Clark  has presented today for surgery, with the diagnosis of bilinary dyskinesia  The various methods of treatment have been discussed with the patient and family. After consideration of risks, benefits and other options for treatment, the patient has consented to  Procedure(s): LAPAROSCOPIC CHOLECYSTECTOMY WITH INTRAOPERATIVE CHOLANGIOGRAM (N/A) as a surgical intervention .  The patient's history has been reviewed, patient examined, no change in status, stable for surgery.  I have reviewed the patient's chart and labs.  Questions were answered to the patient's satisfaction.    Mary Sella. Andrey Campanile, MD, FACS General, Bariatric, & Minimally Invasive Surgery [facility], [place]   [facility] M

## 2013-01-05 NOTE — Preoperative (Signed)
Beta Blockers   Reason not to administer Beta Blockers:Not Applicable 

## 2013-01-05 NOTE — Anesthesia Procedure Notes (Signed)
Procedure Name: Intubation Date/Time: 01/05/2013 11:00 AM Performed by: Fransisca Kaufmann Pre-anesthesia Checklist: Patient identified, Emergency Drugs available, Suction available, Patient being monitored and Timeout performed Patient Re-evaluated:Patient Re-evaluated prior to inductionOxygen Delivery Method: Circle system utilized Preoxygenation: Pre-oxygenation with 100% oxygen Intubation Type: IV induction Ventilation: Mask ventilation without difficulty Laryngoscope Size: Miller and 2 Grade View: Grade II Tube type: Oral Number of attempts: 1 Airway Equipment and Method: Stylet Placement Confirmation: ETT inserted through vocal cords under direct vision,  positive ETCO2 and breath sounds checked- equal and bilateral Secured at: 21 cm Tube secured with: Tape Dental Injury: Teeth and Oropharynx as per pre-operative assessment

## 2013-01-05 NOTE — Op Note (Signed)
Laparoscopic Cholecystectomy  Procedure Note  Indications: This patient presents with symptomatic gallbladder disease and will undergo laparoscopic cholecystectomy. Please see H&P for further details  Pre-operative Diagnosis: Nonspecified abnormal findings on radiological and other examination of biliary tract, biliary dyskinesia, RUQ pain  Post-operative Diagnosis: Same;   Surgeon: Atilano Ina   Assistants: none  Anesthesia: General endotracheal anesthesia  ASA Class: 2  Procedure Details  The patient was seen again in the Holding Room. The risks, benefits, complications, treatment options, and expected outcomes were discussed with the patient. The possibilities of reaction to medication, pulmonary aspiration, perforation of viscus, bleeding, recurrent infection, finding a normal gallbladder, the need for additional procedures, failure to diagnose a condition, the possible need to convert to an open procedure, and creating a complication requiring transfusion or operation were discussed with the patient. The likelihood of improving the patient's symptoms with return to their baseline status is good.  The patient and/or family concurred with the proposed plan, giving informed consent. The site of surgery properly noted. The patient was taken to Operating Room, identified as SHAQUASHA GERSTEL and the procedure verified as Laparoscopic Cholecystectomy with Intraoperative Cholangiogram. A Time Out was held and the above information confirmed.  Prior to the induction of general anesthesia, antibiotic prophylaxis was administered. General endotracheal anesthesia was then administered and tolerated well. After the induction, the abdomen was prepped with Chloraprep and draped in the sterile fashion. The patient was positioned in the supine position.  Local anesthetic agent was injected into the skin near the umbilicus and an incision made. We dissected down to the abdominal fascia with blunt  dissection.  The fascia was incised vertically and we entered the peritoneal cavity bluntly.  A pursestring suture of 0-Vicryl was placed around the fascial opening.  The Hasson cannula was inserted and secured with the stay suture.  Pneumoperitoneum was then created with CO2 and tolerated well without any adverse changes in the patient's vital signs. An 5-mm port was placed in the subxiphoid position.  Two 5-mm ports were placed in the right upper quadrant. All skin incisions were infiltrated with a local anesthetic agent before making the incision and placing the trocars.   We positioned the patient in reverse Trendelenburg, tilted slightly to the patient's left.  The gallbladder was identified, the fundus grasped and retracted cephalad. Adhesions were lysed bluntly and with the electrocautery where indicated, taking care not to injure any adjacent organs or viscus. The infundibulum was grasped and retracted laterally, exposing the peritoneum overlying the triangle of Calot. This was then divided and exposed in a blunt fashion. A critical view of the cystic duct was obtained.  The cystic artery was small and anterior to the cystic duct. The cystic duct was clearly identified and bluntly dissected circumferentially. A large window was made and no other structures were seen entering the gallbladder. Since the artery was anterior to the duct it was ligated with 1 clip proximally and cauterized distally.  The cystic duct was ligated with a clip distally.   An incision was made in the cystic duct and bile was encountered and the [facility] cholangiogram catheter introduced. The catheter was secured using a clip. The duct would not flush with saline. The duct distal to the catheter was ballooning with saline but would not go further downstream into the biliary tree.  The clip securing the catheter was removed and the catheter was repositioned and secured again. Saline was flushed and a lot of resistance was noticed and the  cystic duct downstream was dilating but would not drain. I decided I did not want to try to do the cholangiogram with the contrast and risk disrupting the duct therefore I decided not to do the cholangiogram.  The catheter was then removed.   The cystic duct was then ligated with clips and divided.   The gallbladder was dissected from the liver bed in retrograde fashion with the electrocautery. The gallbladder was removed and placed in an Endocatch sac.  The gallbladder and Endocatch sac were then removed through the umbilical port site. The liver bed was irrigated and inspected. Hemostasis was achieved with the electrocautery. Copious irrigation was utilized and was repeatedly aspirated until clear.  The pursestring suture was used to close the umbilical fascia.    We again inspected the right upper quadrant for hemostasis.  The umbilical closure was inspected and there was no air leak and nothing trapped within the closure. Pneumoperitoneum was released as we removed the trocars.  4-0 Monocryl was used to close the skin.   Benzoin, steri-strips, and clean dressings were applied. The patient was then extubated and brought to the recovery room in stable condition. Instrument, sponge, and needle counts were correct at closure and at the conclusion of the case.   Findings: Chronic Cholecystitis without Cholelithiasis  Estimated Blood Loss: Minimal         Drains: none         Specimens: Gallbladder           Complications: None; patient tolerated the procedure well.         Disposition: PACU - hemodynamically stable.         Condition: stable  Mary Sella. Andrey Campanile, MD, FACS General, Bariatric, & Minimally Invasive Surgery [facility], [place]

## 2013-01-05 NOTE — Transfer of Care (Signed)
Immediate Anesthesia Transfer of Care Note  Patient: Latoya Clark  Procedure(s) Performed: Procedure(s): LAPAROSCOPIC CHOLECYSTECTOMY WITH attempted INTRAOPERATIVE CHOLANGIOGRAM (N/A)  Patient Location: PACU  Anesthesia Type:General  Level of Consciousness: awake, alert , oriented and sedated  Airway & Oxygen Therapy: Patient Spontanous Breathing and Patient connected to nasal cannula oxygen  Post-op Assessment: Report given to PACU RN, Post -op Vital signs reviewed and stable and Patient moving all extremities  Post vital signs: Reviewed and stable  Complications: No apparent anesthesia complications

## 2013-01-05 NOTE — H&P (View-Only) (Signed)
Patient ID: Latoya Clark, female   DOB: 26-Mar-1960, 52 y.o.   MRN: [id number]  CC: abd pain  HPI Latoya Clark is a 52 y.o. female.   HPI 52 year old Caucasian female referred by Dr. Evette Cristal for evaluation for possible cholecystectomy. The patient had previously seen one of my partners a few weeks ago and was scheduled for surgery. However we were not able to accommodate the specific date that they had requested therefore we canceled surgery. She states that she has seen her for second opinion about her gallbladder. She apparently was admitted to [facility] back in September with severe epigastric and right upper quadrant pain.  She states that it felt like she was having a heart attack. Was associated with nausea. She was admitted and a cardiac workup was negative. She underwent abdominal ultrasound and hiatus scan. Per the electronic medical record through care everywhere She initially had had some mildly elevated AST and ALTs however they steadily increased. On day of discharge they were starting to trend down but still elevated. She denies having any ongoing severe pain that prompted her to go to the hospital. She does have some nonspecific right-sided abdominal pain at times since then. Most recently she developed some left-sided abdominal pain and saw Dr. Evette Cristal and was placed on antibiotics for diverticulitis 2 days ago. She is currently taking Cipro and Flagyl. Past Medical History  Diagnosis Date  . Hypertension   . Arthritis   . Active smoker     1 PPD  . Depression   . Sarcoid   . Mastodynia   . Vaginal septum   . Insomnia   . Hypothyroid   . IUD 02/13/07    MIRENA  . Anxiety     Past Surgical History  Procedure Laterality Date  . Cesarean section  1981, 1984  . Thyroidectomy  2002  . Copd    . Hysteroscopy  02/13/07    HYSTEROSCOPY, D&C, MIRENA INSERTED  . 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 Depression Mother     . Anxiety disorder Mother   . Depression Father   . Cancer Father     angioplasty thyroid carcenoma  . Diabetes Father   . Hypertension Father   . Thyroid disease Father   . Bipolar disorder Sister   . Hypertension Sister   . Schizophrenia Maternal Grandfather   . Depression Maternal Grandmother   . Anxiety disorder Maternal Grandmother   . Anxiety disorder Paternal Grandfather   . Depression Paternal Grandfather   . Diabetes Paternal Grandfather   . Anxiety disorder Paternal Grandmother   . Depression Paternal Grandmother   . Cancer Paternal Grandmother     stomach  . Diabetes Paternal Grandmother     Social History History  Substance Use Topics  . Smoking status: Current Every Day Smoker -- 1.00 packs/day for 15 years    Types: Cigarettes  . Smokeless tobacco: Never Used  . Alcohol Use: No    Allergies  Allergen Reactions  . Codeine     Headache.   . Morphine     Headache. // patient denies having allergy to medication  . Prednisone     Widespread pain throughout body.     Current Outpatient Prescriptions  Medication Sig Dispense Refill  . budesonide (ENTOCORT EC) 3 MG 24 hr capsule Take 6 mg by mouth every morning.        Marland Kitchen buPROPion (WELLBUTRIN XL) 300  MG 24 hr tablet Take 300 mg by mouth daily.      . ciprofloxacin (CIPRO) 500 MG tablet       . clonazePAM (KLONOPIN) 1 MG tablet Take 1 mg by mouth 3 (three) times daily as needed for anxiety.       . divalproex (DEPAKOTE) 250 MG DR tablet Take 250 mg by mouth 3 (three) times daily.      Marland Kitchen escitalopram (LEXAPRO) 20 MG tablet Take 20 mg by mouth daily.      Marland Kitchen levothyroxine (SYNTHROID, LEVOTHROID) 88 MCG tablet Take 88 mcg by mouth daily.      . metroNIDAZOLE (FLAGYL) 500 MG tablet       . escitalopram (LEXAPRO) 20 MG tablet Take 1 tablet (20 mg total) by mouth every morning.  30 tablet  0   No current facility-administered medications for this visit.    Review of Systems Review of Systems  Constitutional:  Negative for fever, activity change, appetite change and unexpected weight change.  HENT: Negative for nosebleeds and trouble swallowing.   Eyes: Negative for photophobia and visual disturbance.  Respiratory: Negative for chest tightness and shortness of breath.   Cardiovascular: Negative for chest pain and leg swelling.       Denies CP, SOB, orthopnea, PND, DOE  Gastrointestinal: Positive for nausea and abdominal pain.  Genitourinary: Negative for dysuria and difficulty urinating.  Musculoskeletal: Negative for arthralgias.  Skin: Negative for pallor and rash.  Neurological: Negative for dizziness, seizures, facial asymmetry and numbness.       Denies TIA and amaurosis fugax   Hematological: Negative for adenopathy. Does not bruise/bleed easily.  Psychiatric/Behavioral: Negative for behavioral problems and agitation.    Blood pressure 116/72, pulse 68, temperature 97.6 F (36.4 C), temperature source Temporal, resp. rate 16, height 5' 3.5" (1.613 m), weight 160 lb (72.576 kg).  Physical Exam Physical Exam  Vitals reviewed. Constitutional: She is oriented to person, place, and time. She appears well-developed and well-nourished. No distress.  HENT:  Head: Normocephalic and atraumatic.  Right Ear: External ear normal.  Left Ear: External ear normal.  Eyes: Conjunctivae are normal. No scleral icterus.  Neck: Normal range of motion. Neck supple. No tracheal deviation present. No thyromegaly present.  Cardiovascular: Normal rate and normal heart sounds.   Pulmonary/Chest: Effort normal and breath sounds normal. No stridor. No respiratory distress. She has no wheezes.  Abdominal: Soft. She exhibits no distension. There is no tenderness. There is no rebound.    Musculoskeletal: She exhibits no edema and no tenderness.  Lymphadenopathy:    She has no cervical adenopathy.  Neurological: She is alert and oriented to person, place, and time. She exhibits normal muscle tone.  Skin: Skin  is warm and dry. No rash noted. She is not diaphoretic. No erythema.  Psychiatric: She has a normal mood and affect. Her behavior is normal. Judgment and thought content normal.    Data Reviewed Dr Dixon Boos note D/c summary from [facility] via Care everywhere 9/21 - negative cardiac workup, AST 1157 -->1024, ALT 1714 -->726; u/s - cholesterol polyps only HIDA scan - EF 0%  Assessment    Biliary dyskinesia Elevated LFTs  Diverticulitis    Plan    I believe the patient's symptoms she had in September are consistent with gallbladder disease.  We discussed gallbladder disease. The patient was given Agricultural engineer. We discussed non-operative and operative management. We discussed the signs & symptoms of acute cholecystitis  I discussed laparoscopic cholecystectomy with IOC  in detail.  The patient was given educational material as well as diagrams detailing the procedure.  We discussed the risks and benefits of a laparoscopic cholecystectomy including, but not limited to bleeding, infection, injury to surrounding structures such as the intestine or liver, bile leak, retained gallstones, need to convert to an open procedure, prolonged diarrhea, blood clots such as  DVT, common bile duct injury, anesthesia risks, and possible need for additional procedures.  We discussed the typical post-operative recovery course. I explained that the likelihood of improvement of their symptoms is good.  I explained that I would want to wait to do her gallbladder surgery until her diverticulitis spell has resolved. Therefore we will not schedule her surgery any earlier than 2 weeks from now.   We also discussed her elevated LFTs. I explained that we would repeat them preoperatively and if they were still elevated then we would probably need to cancel surgery so that she could have her transaminitis evaluated  Mary Sella. Andrey Campanile, MD, FACS General, Bariatric, & Minimally Invasive Surgery [facility], [place]          [facility] M 12/15/2012, 4:24 PM

## 2013-01-05 NOTE — Anesthesia Postprocedure Evaluation (Signed)
Anesthesia Post Note  Patient: Latoya Clark  Procedure(s) Performed: Procedure(s) (LRB): LAPAROSCOPIC CHOLECYSTECTOMY WITH attempted INTRAOPERATIVE CHOLANGIOGRAM (N/A)  Anesthesia type: general  Patient location: PACU  Post pain: Pain level controlled  Post assessment: Patient's Cardiovascular Status Stable  Last Vitals:  Filed Vitals:   01/05/13 1402  BP: 126/79  Pulse: 75  Temp: 36.7 C  Resp: 18    Post vital signs: Reviewed and stable  Level of consciousness: sedated  Complications: No apparent anesthesia complications

## 2013-01-06 ENCOUNTER — Encounter (HOSPITAL_COMMUNITY): Payer: Self-pay | Admitting: General Surgery

## 2013-01-06 ENCOUNTER — Telehealth (INDEPENDENT_AMBULATORY_CARE_PROVIDER_SITE_OTHER): Payer: Self-pay

## 2013-01-06 NOTE — Telephone Encounter (Signed)
Patient states her dressing was bloody this am , should she change it. Denies temp . Advised her to change it and to call if  dressing becomes saturated again. Patient verbalized understanding

## 2013-01-28 ENCOUNTER — Ambulatory Visit (INDEPENDENT_AMBULATORY_CARE_PROVIDER_SITE_OTHER): Payer: 59 | Admitting: General Surgery

## 2013-01-28 ENCOUNTER — Encounter (INDEPENDENT_AMBULATORY_CARE_PROVIDER_SITE_OTHER): Payer: Self-pay | Admitting: General Surgery

## 2013-01-28 VITALS — BP 125/80 | HR 79 | Temp 98.0°F | Resp 18 | Ht 63.0 in | Wt 173.0 lb

## 2013-01-28 DIAGNOSIS — Z09 Encounter for follow-up examination after completed treatment for conditions other than malignant neoplasm: Secondary | ICD-10-CM

## 2013-01-29 NOTE — Progress Notes (Signed)
Subjective:     Patient ID: Latoya Clark, female   DOB: 02/12/1960, 53 y.o.   MRN: [id number]  HPI 53 year old female comes in for followup after undergoing laparoscopic cholecystectomy on December 29. She reports that overall she is doing okay. She had some initial problems with bloating and constipation. However that is slowly improving. She denies any fevers or chills. The immediate discomfort after surgery has resolved. She reports her appetite is returning to normal.  Review of Systems     Objective:   Physical Exam BP 125/80  Pulse 79  Temp(Src) 98 F (36.7 C) (Temporal)  Resp 18  Ht 5\' 3"  (1.6 m)  Wt 173 lb (78.472 kg)  BMI 30.65 kg/m2  Gen: alert, NAD, non-toxic appearing Pupils: equal, no scleral icterus Pulm: Lungs clear to auscultation, symmetric chest rise CV: regular rate and rhythm Abd: soft, nontender, nondistended. Well-healed trocar sites. No cellulitis. No incisional hernia Ext: no edema,  Skin: no rash,     Assessment:     Status post laparoscopic cholecystectomy     Plan:     We reviewed her pathology report which showed chronic cholecystitis. She was provided a copy. I explained that her bowels will continue to normalize with time. We discussed the importance of drinking plenty of water and gradually adopting a high fiber diet. Followup as needed  Mary Sellaric M. Andrey CampanileWilson, MD, FACS General, Bariatric, & Minimally Invasive Surgery [facility], [place]

## 2013-05-27 ENCOUNTER — Other Ambulatory Visit: Payer: Self-pay | Admitting: Internal Medicine

## 2013-05-27 ENCOUNTER — Ambulatory Visit
Admission: RE | Admit: 2013-05-27 | Discharge: 2013-05-27 | Disposition: A | Discharge status: Home / Self Care | Payer: 59 | Source: Ambulatory Visit | Attending: Internal Medicine | Admitting: Internal Medicine

## 2013-05-27 DIAGNOSIS — M542 Cervicalgia: Secondary | ICD-10-CM

## 2013-06-24 ENCOUNTER — Other Ambulatory Visit (HOSPITAL_COMMUNITY)
Admission: RE | Admit: 2013-06-24 | Discharge: 2013-06-24 | Disposition: A | Discharge status: Home / Self Care | Payer: 59 | Source: Ambulatory Visit | Attending: Gynecology | Admitting: Gynecology

## 2013-06-24 ENCOUNTER — Ambulatory Visit (INDEPENDENT_AMBULATORY_CARE_PROVIDER_SITE_OTHER): Payer: 59 | Admitting: Women's Health

## 2013-06-24 ENCOUNTER — Encounter: Payer: Self-pay | Admitting: Women's Health

## 2013-06-24 VITALS — BP 124/82 | Ht 63.5 in | Wt 202.0 lb

## 2013-06-24 DIAGNOSIS — Z01419 Encounter for gynecological examination (general) (routine) without abnormal findings: Secondary | ICD-10-CM

## 2013-06-24 NOTE — Patient Instructions (Signed)
Health Recommendations for Postmenopausal Women Respected and ongoing research has looked at the most common causes of death, disability, and poor quality of life in postmenopausal women. The causes include heart disease, diseases of blood vessels, diabetes, depression, cancer, and bone loss (osteoporosis). Many things can be done to help lower the chances of developing these and other common problems: CARDIOVASCULAR DISEASE Heart Disease: A heart attack is a medical emergency. Know the signs and symptoms of a heart attack. Below are things women can do to reduce their risk for heart disease.   Do not smoke. If you smoke, quit.  Aim for a healthy weight. Being overweight causes many preventable deaths. Eat a healthy and balanced diet and drink an adequate amount of liquids.  Get moving. Make a commitment to be more physically active. Aim for 30 minutes of activity on most, if not all days of the week.  Eat for heart health. Choose a diet that is low in saturated fat and cholesterol and eliminate trans fat. Include whole grains, vegetables, and fruits. Read and understand the labels on food containers before buying.  Know your numbers. Ask your caregiver to check your blood pressure, cholesterol (total, HDL, LDL, triglycerides) and blood glucose. Work with your caregiver on improving your entire clinical picture.  High blood pressure. Limit or stop your table salt intake (try salt substitute and food seasonings). Avoid salty foods and drinks. Read labels on food containers before buying. Eating well and exercising can help control high blood pressure. STROKE  Stroke is a medical emergency. Stroke may be the result of a blood clot in a blood vessel in the brain or by a brain hemorrhage (bleeding). Know the signs and symptoms of a stroke. To lower the risk of developing a stroke:  Avoid fatty foods.  Quit smoking.  Control your diabetes, blood pressure, and irregular heart rate. THROMBOPHLEBITIS  (BLOOD CLOT) OF THE LEG  Becoming overweight and leading a stationary lifestyle may also contribute to developing blood clots. Controlling your diet and exercising will help lower the risk of developing blood clots. CANCER SCREENING  Breast Cancer: Take steps to reduce your risk of breast cancer.  You should practice "breast self-awareness." This means understanding the normal appearance and feel of your breasts and should include breast self-examination. Any changes detected, no matter how small, should be reported to your caregiver.  After age 55, you should have a clinical breast exam (CBE) every year.  Starting at age 55, you should consider having a mammogram (breast X-ray) every year.  If you have a family history of breast cancer, talk to your caregiver about genetic screening.  If you are at high risk for breast cancer, talk to your caregiver about having an MRI and a mammogram every year.  Intestinal or Stomach Cancer: Tests to consider are a rectal exam, fecal occult blood, sigmoidoscopy, and colonoscopy. Women who are high risk may need to be screened at an earlier age and more often.  Cervical Cancer:  Beginning at age 55, you should have a Pap test every 3 years as long as the past 3 Pap tests have been normal.  If you have had past treatment for cervical cancer or a condition that could lead to cancer, you need Pap tests and screening for cancer for at least 20 years after your treatment.  If you had a hysterectomy for a problem that was not cancer or a condition that could lead to cancer, then you no longer need Pap tests.    If you are between ages 65 and 70, and you have had normal Pap tests going back 10 years, you no longer need Pap tests.  If Pap tests have been discontinued, risk factors (such as a new sexual partner) need to be reassessed to determine if screening should be resumed.  Some medical problems can increase the chance of getting cervical cancer. In these  cases, your caregiver may recommend more frequent screening and Pap tests.  Uterine Cancer: If you have vaginal bleeding after reaching menopause, you should notify your caregiver.  Ovarian cancer: Other than yearly pelvic exams, there are no reliable tests available to screen for ovarian cancer at this time except for yearly pelvic exams.  Lung Cancer: Yearly chest X-rays can detect lung cancer and should be done on high risk women, such as cigarette smokers and women with chronic lung disease (emphysema).  Skin Cancer: A complete body skin exam should be done at your yearly examination. Avoid overexposure to the sun and ultraviolet light lamps. Use a strong sun block cream when in the sun. All of these things are important in lowering the risk of skin cancer. MENOPAUSE Menopause Symptoms: Hormone therapy products are effective for treating symptoms associated with menopause:  Moderate to severe hot flashes.  Night sweats.  Mood swings.  Headaches.  Tiredness.  Loss of sex drive.  Insomnia.  Other symptoms. Hormone replacement carries certain risks, especially in older women. Women who use or are thinking about using estrogen or estrogen with progestin treatments should discuss that with their caregiver. Your caregiver will help you understand the benefits and risks. The ideal dose of hormone replacement therapy is not known. The Food and Drug Administration (FDA) has concluded that hormone therapy should be used only at the lowest doses and for the shortest amount of time to reach treatment goals.  OSTEOPOROSIS Protecting Against Bone Loss and Preventing Fracture: If you use hormone therapy for prevention of bone loss (osteoporosis), the risks for bone loss must outweigh the risk of the therapy. Ask your caregiver about other medications known to be safe and effective for preventing bone loss and fractures. To guard against bone loss or fractures, the following is recommended:  If  you are less than age 50, take 1000 mg of calcium and at least 600 mg of Vitamin D per day.  If you are greater than age 50 but less than age 70, take 1200 mg of calcium and at least 600 mg of Vitamin D per day.  If you are greater than age 70, take 1200 mg of calcium and at least 800 mg of Vitamin D per day. Smoking and excessive alcohol intake increases the risk of osteoporosis. Eat foods rich in calcium and vitamin D and do weight bearing exercises several times a week as your caregiver suggests. DIABETES Diabetes Melitus: If you have Type I or Type 2 diabetes, you should keep your blood sugar under control with diet, exercise and recommended medication. Avoid too many sweets, starchy and fatty foods. Being overweight can make control more difficult. COGNITION AND MEMORY Cognition and Memory: Menopausal hormone therapy is not recommended for the prevention of cognitive disorders such as Alzheimer's disease or memory loss.  DEPRESSION  Depression may occur at any age, but is common in elderly women. The reasons may be because of physical, medical, social (loneliness), or financial problems and needs. If you are experiencing depression because of medical problems and control of symptoms, talk to your caregiver about this. Physical activity and   exercise may help with mood and sleep. Community and volunteer involvement may help your sense of value and worth. If you have depression and you feel that the problem is getting worse or becoming severe, talk to your caregiver about treatment options that are best for you. ACCIDENTS  Accidents are common and can be serious in the elderly woman. Prepare your house to prevent accidents. Eliminate throw rugs, place hand bars in the bath, shower and toilet areas. Avoid wearing high heeled shoes or walking on wet, snowy, and icy areas. Limit or stop driving if you have vision or hearing problems, or you feel you are unsteady with you movements and  reflexes. HEPATITIS C Hepatitis C is a type of viral infection affecting the liver. It is spread mainly through contact with blood from an infected person. It can be treated, but if left untreated, it can lead to severe liver damage over years. Many people who are infected do not know that the virus is in their blood. If you are a "baby-boomer", it is recommended that you have one screening test for Hepatitis C. IMMUNIZATIONS  Several immunizations are important to consider having during your senior years, including:   Tetanus, diptheria, and pertussis booster shot.  Influenza every year before the flu season begins.  Pneumonia vaccine.  Shingles vaccine.  Others as indicated based on your specific needs. Talk to your caregiver about these. Document Released: 02/16/2005 Document Revised: 12/12/2011 Document Reviewed: 10/13/2007 ExitCare Patient Information 2015 [employer]. This information is not intended to replace advice given to you by your health care provider. Make sure you discuss any questions you have with your health care provider.  

## 2013-06-24 NOTE — Progress Notes (Signed)
Latoya LeydenJulie C Clark 12/20/1960 [id number]    History:    Presents for annual exam.  Postmenopausal/no HRT/no bleeding. Mirena IUD placed 2009 for menorrhagia removed 2014. Normal Pap and mammogram history. 2011 negative colonoscopy for polyps, colitis noted. Hypertension in size hypothyroid/sarcoid primary care manages labs and meds. Severe depression sees a psychiatrist and counselor, has gained 50 pounds in one year. Dexa 2013 T score spine -1.7 spine, hip 0.1. Didelphys uterus.  Past medical history, past surgical history, family history and social history were all reviewed and documented in the EPIC chart. Helps care for her mentally ill sister who is now living with her full-time. Has numerous dogs.  ROS:  A  12 point ROS was performed and pertinent positives and negatives are included.  Exam:  Filed Vitals:   06/24/13 0835  BP: 124/82    General appearance:  Normal Thyroid:  Symmetrical, normal in size, without palpable masses or nodularity. Respiratory  Auscultation:  Clear without wheezing or rhonchi Cardiovascular  Auscultation:  Regular rate, without rubs, murmurs or gallops  Edema/varicosities:  Not grossly evident Abdominal  Soft,nontender, without masses, guarding or rebound.  Liver/spleen:  No organomegaly noted  Hernia:  None appreciated  Skin  Inspection:  Grossly normal   Breasts: Examined lying and sitting.     Right: Without masses, retractions, discharge or axillary adenopathy.     Left: Without masses, retractions, discharge or axillary adenopathy. Gentitourinary   Inguinal/mons:  Normal without inguinal adenopathy  External genitalia:  Normal  BUS/Urethra/Skene's glands:  Normal  Vagina:  Normal  Cervix:  Normal  Uterus:   normal in size, shape and contour.  Midline and mobile  Adnexa/parametria:     Rt: Without masses or tenderness.   Lt: Without masses or tenderness.  Anus and perineum: Normal  Digital rectal exam: Normal sphincter tone without palpated  masses or tenderness  Assessment/Plan:  53 y.o. MWF G2P2 for annual exam.    Postmenopausal/no HRT/no bleeding Severe depression psychiatrist and counselor manage Hypertension/hypothyroid/sarcoid to primary care manages labs and meds Osteopenia at spine, normal hip  Plan: Repeat DEXA next year, continue daily walking, decrease calories for weight loss, calcium rich diet, vitamin D 2000 daily encouraged. UA, Pap. Pap normal 2012.  Note: This dictation was prepared with Dragon/digital dictation.  Any transcriptional errors that result are unintentional. Latoya ChallengerYOUNG,Berta Latoya J Vent[facility] Center LLCWHNP, 10:09 AM 06/24/2013

## 2013-06-25 LAB — URINALYSIS W MICROSCOPIC + REFLEX CULTURE
Bilirubin Urine: NEGATIVE
CRYSTALS: NONE SEEN
Casts: NONE SEEN
Glucose, UA: NEGATIVE mg/dL
Hgb urine dipstick: NEGATIVE
Ketones, ur: NEGATIVE mg/dL
NITRITE: NEGATIVE
Protein, ur: NEGATIVE mg/dL
SPECIFIC GRAVITY, URINE: 1.007 (ref 1.005–1.030)
Squamous Epithelial / LPF: NONE SEEN
Urobilinogen, UA: 0.2 mg/dL (ref 0.0–1.0)
pH: 7.5 (ref 5.0–8.0)

## 2013-06-25 LAB — CYTOLOGY - PAP

## 2013-06-26 LAB — URINE CULTURE: Colony Count: >=100000

## 2013-07-24 ENCOUNTER — Encounter: Payer: Self-pay | Admitting: Gynecology

## 2013-07-29 ENCOUNTER — Other Ambulatory Visit: Payer: Self-pay | Admitting: Internal Medicine

## 2013-07-29 ENCOUNTER — Ambulatory Visit
Admission: RE | Admit: 2013-07-29 | Discharge: 2013-07-29 | Disposition: A | Discharge status: Home / Self Care | Payer: 59 | Source: Ambulatory Visit | Attending: Internal Medicine | Admitting: Internal Medicine

## 2013-07-29 DIAGNOSIS — M79605 Pain in left leg: Secondary | ICD-10-CM

## 2013-08-10 ENCOUNTER — Other Ambulatory Visit: Payer: Self-pay | Admitting: Gastroenterology

## 2013-08-10 DIAGNOSIS — R1032 Left lower quadrant pain: Secondary | ICD-10-CM

## 2013-08-11 ENCOUNTER — Emergency Department (HOSPITAL_COMMUNITY): Payer: 59

## 2013-08-11 ENCOUNTER — Emergency Department (HOSPITAL_COMMUNITY)
Admission: EM | Admit: 2013-08-11 | Discharge: 2013-08-11 | Disposition: A | Discharge status: Home / Self Care | Payer: 59 | Attending: Emergency Medicine | Admitting: Emergency Medicine

## 2013-08-11 ENCOUNTER — Encounter (HOSPITAL_COMMUNITY): Payer: Self-pay | Admitting: Emergency Medicine

## 2013-08-11 DIAGNOSIS — Z79899 Other long term (current) drug therapy: Secondary | ICD-10-CM | POA: Diagnosis not present

## 2013-08-11 DIAGNOSIS — Z87440 Personal history of urinary (tract) infections: Secondary | ICD-10-CM | POA: Insufficient documentation

## 2013-08-11 DIAGNOSIS — R51 Headache: Secondary | ICD-10-CM | POA: Insufficient documentation

## 2013-08-11 DIAGNOSIS — R1084 Generalized abdominal pain: Secondary | ICD-10-CM | POA: Insufficient documentation

## 2013-08-11 DIAGNOSIS — M549 Dorsalgia, unspecified: Secondary | ICD-10-CM | POA: Insufficient documentation

## 2013-08-11 DIAGNOSIS — F3289 Other specified depressive episodes: Secondary | ICD-10-CM | POA: Insufficient documentation

## 2013-08-11 DIAGNOSIS — M129 Arthropathy, unspecified: Secondary | ICD-10-CM | POA: Diagnosis not present

## 2013-08-11 DIAGNOSIS — Q519 Congenital malformation of uterus and cervix, unspecified: Secondary | ICD-10-CM | POA: Diagnosis not present

## 2013-08-11 DIAGNOSIS — Q5270 Unspecified congenital malformations of vulva: Secondary | ICD-10-CM

## 2013-08-11 DIAGNOSIS — IMO0002 Reserved for concepts with insufficient information to code with codable children: Secondary | ICD-10-CM | POA: Insufficient documentation

## 2013-08-11 DIAGNOSIS — Q524 Other congenital malformations of vagina: Secondary | ICD-10-CM

## 2013-08-11 DIAGNOSIS — I10 Essential (primary) hypertension: Secondary | ICD-10-CM | POA: Diagnosis not present

## 2013-08-11 DIAGNOSIS — F411 Generalized anxiety disorder: Secondary | ICD-10-CM | POA: Insufficient documentation

## 2013-08-11 DIAGNOSIS — H53149 Visual discomfort, unspecified: Secondary | ICD-10-CM | POA: Diagnosis not present

## 2013-08-11 DIAGNOSIS — F17200 Nicotine dependence, unspecified, uncomplicated: Secondary | ICD-10-CM | POA: Insufficient documentation

## 2013-08-11 DIAGNOSIS — E039 Hypothyroidism, unspecified: Secondary | ICD-10-CM | POA: Insufficient documentation

## 2013-08-11 DIAGNOSIS — Z8719 Personal history of other diseases of the digestive system: Secondary | ICD-10-CM | POA: Insufficient documentation

## 2013-08-11 DIAGNOSIS — Z792 Long term (current) use of antibiotics: Secondary | ICD-10-CM | POA: Insufficient documentation

## 2013-08-11 DIAGNOSIS — F329 Major depressive disorder, single episode, unspecified: Secondary | ICD-10-CM | POA: Insufficient documentation

## 2013-08-11 DIAGNOSIS — Z8619 Personal history of other infectious and parasitic diseases: Secondary | ICD-10-CM | POA: Insufficient documentation

## 2013-08-11 DIAGNOSIS — Z8742 Personal history of other diseases of the female genital tract: Secondary | ICD-10-CM | POA: Diagnosis not present

## 2013-08-11 DIAGNOSIS — M5412 Radiculopathy, cervical region: Secondary | ICD-10-CM

## 2013-08-11 DIAGNOSIS — R519 Headache, unspecified: Secondary | ICD-10-CM

## 2013-08-11 DIAGNOSIS — R110 Nausea: Secondary | ICD-10-CM | POA: Insufficient documentation

## 2013-08-11 LAB — CBC WITH DIFFERENTIAL/PLATELET
Basophils Absolute: 0 10*3/uL (ref 0.0–0.1)
Basophils Relative: 1 % (ref 0–1)
Eosinophils Absolute: 0.1 10*3/uL (ref 0.0–0.7)
Eosinophils Relative: 2 % (ref 0–5)
HEMATOCRIT: 38.6 % (ref 36.0–46.0)
HEMOGLOBIN: 12.9 g/dL (ref 12.0–15.0)
Lymphocytes Relative: 37 % (ref 12–46)
Lymphs Abs: 2.3 10*3/uL (ref 0.7–4.0)
MCH: 30.6 pg (ref 26.0–34.0)
MCHC: 33.4 g/dL (ref 30.0–36.0)
MCV: 91.7 fL (ref 78.0–100.0)
MONO ABS: 0.4 10*3/uL (ref 0.1–1.0)
MONOS PCT: 7 % (ref 3–12)
NEUTROS ABS: 3.3 10*3/uL (ref 1.7–7.7)
Neutrophils Relative %: 53 % (ref 43–77)
Platelets: 245 10*3/uL (ref 150–400)
RBC: 4.21 MIL/uL (ref 3.87–5.11)
RDW: 13 % (ref 11.5–15.5)
WBC: 6.2 10*3/uL (ref 4.0–10.5)

## 2013-08-11 LAB — BASIC METABOLIC PANEL
Anion gap: 11 (ref 5–15)
BUN: 17 mg/dL (ref 6–23)
CHLORIDE: 101 meq/L (ref 96–112)
CO2: 27 mEq/L (ref 19–32)
Calcium: 8.7 mg/dL (ref 8.4–10.5)
Creatinine, Ser: 0.76 mg/dL (ref 0.50–1.10)
GFR calc Af Amer: >90 mL/min (ref >90)
GFR calc non Af Amer: >90 mL/min (ref >90)
Glucose, Bld: 93 mg/dL (ref 70–99)
POTASSIUM: 4.2 meq/L (ref 3.7–5.3)
Sodium: 139 mEq/L (ref 137–147)

## 2013-08-11 MED ORDER — HYDROCODONE-ACETAMINOPHEN 5-325 MG PO TABS: 1.0000 | ORAL_TABLET | Freq: Four times a day (QID) | ORAL | Status: DC | PRN

## 2013-08-11 MED ORDER — FENTANYL CITRATE 0.05 MG/ML IJ SOLN
50.0000 ug | Freq: Once | INTRAMUSCULAR | Status: AC
  Administered 2013-08-11: 50 ug via INTRAVENOUS
  Filled 2013-08-11: qty 2

## 2013-08-11 MED ORDER — DEXAMETHASONE SODIUM PHOSPHATE 10 MG/ML IJ SOLN
10.0000 mg | Freq: Once | INTRAMUSCULAR | Status: AC
  Administered 2013-08-11: 10 mg via INTRAVENOUS
  Filled 2013-08-11: qty 1

## 2013-08-11 NOTE — ED Notes (Signed)
G[facility] 15

## 2013-08-11 NOTE — ED Provider Notes (Signed)
CSN: [id number]     Arrival date & time 08/11/13  1259 History   First MD Initiated Contact with Patient 08/11/13 1321     Chief Complaint  Patient presents with  . Headache  . Neck Pain     (Consider location/radiation/quality/duration/timing/severity/associated sxs/prior Treatment) Patient is a 53 y.o. female presenting with headaches and neck pain.  Headache Associated symptoms: abdominal pain, back pain, myalgias, nausea, neck pain and photophobia   Associated symptoms: no dizziness, no fatigue, no fever, no numbness and no vomiting   Neck Pain Associated symptoms: headaches and photophobia   Associated symptoms: no chest pain, no fever, no numbness and no weakness    Latoya Clark is a 53 year old female presenting to the ER today with 2 months of headache and neck pain. Patient describes her pain as a "burning sensation" which begins in left lateral neck and radiates to her occipital region of her cranium.  She states the pain is 10 out of 10, not helped with Flexeril, ibuprofen. Patient states the pain is made worse with sitting up, and relieved slightly while lying down. She states the pain is constant and is present "all day long". She , and not similar to previous headaches.states these headaches are new and did not seem to be exacerbated by any specific context.  Patient states she saw her PCP when this pain began who prescribed Flexeril, and asked her to followup if the pain persisted. Patient states PCP  went out of town for 2 months, and she was unable to followup with the same provider. Patient reports the Flexeril did not help her pain, and she tried 600 mg of ibuprofen and also with no relief. Patient reports she has had some mild tension headaches in the past, however she has had no migraines previously, and has never had any headaches similar to this one. Patient also reports a mild change in her vision.  Patient denies chest pain, shortness of breath, weakness, numbness/tingling,  fever, chills, nausea, vomiting. Patient does endorse some mild abdominal pain, and states she is scheduled to be worked up for diverticulitis tomorrow. She states this does not concern her tonight. She states this tenderness is not alarming to her as this is a different complaint she has been dealing with "for some time now".  Past Medical History  Diagnosis Date  . Hypertension   . Arthritis   . Active smoker     1 PPD  . Depression   . Sarcoid   . Mastodynia   . Vaginal septum   . Insomnia   . Hypothyroid   . Anxiety   . UTI (lower urinary tract infection)   . Diverticulitis   . Colitis    Past Surgical History  Procedure Laterality Date  . Cesarean section  1981, 1984  . Thyroidectomy  2002  . Copd    . Hysteroscopy  02/13/07    HYSTEROSCOPY, D&C, MIRENA INSERTED  . Replacement total knee Right 05/12  . Cholecystectomy N/A 01/05/2013    Procedure: LAPAROSCOPIC CHOLECYSTECTOMY WITH attempted INTRAOPERATIVE CHOLANGIOGRAM;  Surgeon: Atilano InaEric M Wilson, MD;  [facility];  Service: General;  Laterality: N/A;   Family History  Problem Relation Age of Onset  . Depression Mother   . Anxiety disorder Mother   . Depression Father   . Cancer Father     angioplasty thyroid carcenoma  . Diabetes Father   . Hypertension Father   . Thyroid disease Father   . Bipolar disorder Sister   .  Hypertension Sister   . Schizophrenia Maternal Grandfather   . Depression Maternal Grandmother   . Anxiety disorder Maternal Grandmother   . Anxiety disorder Paternal Grandfather   . Depression Paternal Grandfather   . Diabetes Paternal Grandfather   . Anxiety disorder Paternal Grandmother   . Depression Paternal Grandmother   . Cancer Paternal Grandmother     stomach  . Diabetes Paternal Grandmother    History  Substance Use Topics  . Smoking status: Current Every Day Smoker -- 1.00 packs/day for 15 years    Types: Cigarettes  . Smokeless tobacco: Never Used     Comment: occ alcohol  .  Alcohol Use: Yes     Comment: Rare   OB History   Grav Para Term Preterm Abortions TAB SAB Ect Mult Living   2 2 2       2      Review of Systems  Constitutional: Negative for fever, chills and fatigue.  Eyes: Positive for photophobia. Negative for visual disturbance.  Respiratory: Negative for chest tightness and shortness of breath.   Cardiovascular: Negative for chest pain.  Gastrointestinal: Positive for nausea and abdominal pain. Negative for vomiting.  Genitourinary: Negative for dysuria and urgency.  Musculoskeletal: Positive for arthralgias, back pain, myalgias and neck pain.  Neurological: Positive for headaches. Negative for dizziness, syncope, weakness and numbness.  Psychiatric/Behavioral: Negative.       Allergies  Prednisone and Codeine  Home Medications   Prior to Admission medications   Medication Sig Start Date End Date Taking? Authorizing Provider  Budesonide (UCERIS) 9 MG TB24 Take 9 mg by mouth daily.   Yes Historical Provider, MD  buPROPion (WELLBUTRIN XL) 300 MG 24 hr tablet Take 300 mg by mouth daily.   Yes Historical Provider, MD  ciprofloxacin (CIPRO) 500 MG tablet Take 500 mg by mouth 2 (two) times daily. 14 day course.   Yes Historical Provider, MD  clonazePAM (KLONOPIN) 1 MG tablet Take 1 mg by mouth 3 (three) times daily as needed for anxiety.    Yes Historical Provider, MD  cyclobenzaprine (FLEXERIL) 5 MG tablet Take 5 mg by mouth 3 (three) times daily as needed for muscle spasms.   Yes Historical Provider, MD  divalproex (DEPAKOTE ER) 250 MG 24 hr tablet Take 250 mg by mouth at bedtime.   Yes Historical Provider, MD  escitalopram (LEXAPRO) 20 MG tablet Take 20 mg by mouth daily.   Yes Historical Provider, MD  metroNIDAZOLE (FLAGYL) 250 MG tablet Take 250 mg by mouth 3 (three) times daily. 10 day course.   Yes Historical Provider, MD  omeprazole (PRILOSEC) 40 MG capsule Take 40 mg by mouth daily.   Yes Historical Provider, MD  traZODone (DESYREL) 50  MG tablet Take 50 mg by mouth at bedtime.   Yes Historical Provider, MD  HYDROcodone-acetaminophen (NORCO/VICODIN) 5-325 MG per tablet Take 1 tablet by mouth every 6 (six) hours as needed for moderate pain or severe pain. 08/11/13   Monte Fantasia, PA-C  levothyroxine (SYNTHROID, LEVOTHROID) 88 MCG tablet Take 88 mcg by mouth daily.    Historical Provider, MD   BP 133/81  Pulse 85  Temp(Src) 98.4 F (36.9 C) (Oral)  Resp 18  SpO2 95% Physical Exam  Constitutional: She is oriented to person, place, and time. She appears well-developed and well-nourished. No distress.  HENT:  Head: Normocephalic and atraumatic.  Right Ear: Tympanic membrane and external ear normal.  Left Ear: Tympanic membrane and external ear normal.  Mouth/Throat: Uvula is  midline, oropharynx is clear and moist and mucous membranes are normal. No oropharyngeal exudate, posterior oropharyngeal edema, posterior oropharyngeal erythema or tonsillar abscesses.  Eyes: Lids are normal.  Fundoscopic exam:      The right eye shows no AV nicking, no exudate and no papilledema.       The left eye shows no AV nicking, no exudate and no papilledema.  Cardiovascular: Normal rate, regular rhythm, S1 normal and S2 normal.   No murmur heard. Pulmonary/Chest: Effort normal and breath sounds normal. No accessory muscle usage. Not tachypneic. No respiratory distress.  Abdominal: Normal appearance and bowel sounds are normal. There is generalized tenderness.  Patient has diffuse, mild abdominal tenderness. She states she is scheduled to be worked up for diverticulitis tomorrow.   Musculoskeletal:  Neck exam: Patient has full active range of motion of her neck with mild pain on movement. Patient has a reproducible pain laterally on her neck, bilaterally. Pain is reproducible cephalad along her neck up to the occipital region of her scalp. Mild pain along the cervical spinous processes. No erythema, warmth, swelling, crepitus, ecchymosis noted on  patient's neck posterior aspect of her head.  Neurological: She is alert and oriented to person, place, and time. She has normal strength. She displays no atrophy. No cranial nerve deficit or sensory deficit. She exhibits normal muscle tone. GCS eye subscore is 4. GCS verbal subscore is 5. GCS motor subscore is 6.  Cranial nerves II through XII intact. No facial droop noted. Upper extremities: Motor strength 5 out of 5 in the shoulder, elbow, wrist. Distal sensation intact Lower extremities: Motor strength 5 out of 5 in hip, knee, ankle. Distal sensation intact. No focal neurologic deficit noted  Skin: Skin is warm and dry. She is not diaphoretic.  Psychiatric: She has a normal mood and affect.    ED Course  Procedures (including critical care time) Labs Review Labs Reviewed  CBC WITH DIFFERENTIAL  BASIC METABOLIC PANEL    Imaging Review Ct Head Wo Contrast  08/11/2013   CLINICAL DATA:  Bilateral weakness, headache  EXAM: CT HEAD WITHOUT CONTRAST  CT CERVICAL SPINE WITHOUT CONTRAST  TECHNIQUE: Multidetector CT imaging of the head and cervical spine was performed following the standard protocol without intravenous contrast. Multiplanar CT image reconstructions of the cervical spine were also generated.  COMPARISON:  None.  FINDINGS: CT HEAD FINDINGS  No skull fracture is noted. Paranasal sinuses and mastoid air cells are unremarkable. No intracranial hemorrhage, mass effect or midline shift. No acute infarction. No mass lesion is noted on this unenhanced scan. The gray and white-matter differentiation is preserved.  CT CERVICAL SPINE FINDINGS  Axial images of the cervical spine shows no acute fracture or subluxation. Computer processed images shows no acute fracture or subluxation. Degenerative changes are noted C1-C2 articulation. Mild anterior spurring noted at lower endplate of C4 and C6 vertebral body. Mild disc space flattening at C3-C4 and C4-C5 level. No prevertebral soft tissue swelling  cervical airway is patent. Mild disc space flattening with anterior spurring at C6-C7 level.  IMPRESSION: 1. No acute intracranial abnormality. 2. No cervical spine acute fracture or subluxation. Mild degenerative changes as described above.   Electronically Signed   By: Natasha Mead M.D.   On: 08/11/2013 15:07   Ct Cervical Spine Wo Contrast  08/11/2013   CLINICAL DATA:  Bilateral weakness, headache  EXAM: CT HEAD WITHOUT CONTRAST  CT CERVICAL SPINE WITHOUT CONTRAST  TECHNIQUE: Multidetector CT imaging of the head and cervical spine  was performed following the standard protocol without intravenous contrast. Multiplanar CT image reconstructions of the cervical spine were also generated.  COMPARISON:  None.  FINDINGS: CT HEAD FINDINGS  No skull fracture is noted. Paranasal sinuses and mastoid air cells are unremarkable. No intracranial hemorrhage, mass effect or midline shift. No acute infarction. No mass lesion is noted on this unenhanced scan. The gray and white-matter differentiation is preserved.  CT CERVICAL SPINE FINDINGS  Axial images of the cervical spine shows no acute fracture or subluxation. Computer processed images shows no acute fracture or subluxation. Degenerative changes are noted C1-C2 articulation. Mild anterior spurring noted at lower endplate of C4 and C6 vertebral body. Mild disc space flattening at C3-C4 and C4-C5 level. No prevertebral soft tissue swelling cervical airway is patent. Mild disc space flattening with anterior spurring at C6-C7 level.  IMPRESSION: 1. No acute intracranial abnormality. 2. No cervical spine acute fracture or subluxation. Mild degenerative changes as described above.   Electronically Signed   By: Natasha Mead M.D.   On: 08/11/2013 15:07     EKG Interpretation None      MDM   Final diagnoses:  Cervical radicular pain  Headache, unspecified headache type    53 year old female with a new onset of headache x2 months. Patient states she has a history of  tension-type headaches, however this is a new headache, and she's never had any headaches similar to this one. Initial workup for possible intracranial abnormality, musculoskeletal problems, nerve impingement.  4:54 PM CT head/neck returned with no evidence of acute intracranial abnormality, no cervical spine acute fracture subluxation. Mild degenerative changes in patients C1-C2 articulation suggestive of potential impingement of the greater occipital nerve. Patient's pain treated with fentanyl in the ER, we plan on treating pt with prednisone and having her followup with her PCP. After discussing this plan with patient patient states that prednisone makes her "feel crazy".  Patient was inclined to try a dose of Decadron IM to help with inflammation. We discussed that we will provide patient with analgesics until she can followup with her PCP. Patient was agreeable to this plan. We encouraged patient to call or return to the ER should she have any further questions or concerns.   Signed,  Ladona Mow, PA-C 4:54 PM   Monte Fantasia, PA-C 08/11/13 [phone]

## 2013-08-11 NOTE — ED Notes (Signed)
Pt c/o headache and neck pain x 1 month, was told it was muscular by PCP by x-ray. Pt states antiinflammatories are not working. States neck pain shoots up to head.

## 2013-08-11 NOTE — Discharge Instructions (Signed)

## 2013-08-11 NOTE — ED Provider Notes (Signed)
  Face-to-face evaluation   History: Ongoing left neck pain, for one month.  Physical exam: Alert, calm, cooperative. Neck, with decreased lateral bending, secondary to pain.  Assessment CT cervical spine is consistent with the source of her pain in the upper cervical spine nerve roots  Medical screening examination/treatment/procedure(s) were conducted as a shared visit with non-physician practitioner(s) and myself.  I personally evaluated the patient during the encounter  Flint MelterElliott L Donnie Panik, MD 08/12/13 [phone]

## 2013-08-12 ENCOUNTER — Ambulatory Visit
Admission: RE | Admit: 2013-08-12 | Discharge: 2013-08-12 | Disposition: A | Discharge status: Home / Self Care | Payer: 59 | Source: Ambulatory Visit | Attending: Gastroenterology | Admitting: Gastroenterology

## 2013-08-12 DIAGNOSIS — R1032 Left lower quadrant pain: Secondary | ICD-10-CM

## 2013-08-12 MED ORDER — IOHEXOL 300 MG/ML  SOLN
125.0000 mL | Freq: Once | INTRAMUSCULAR | Status: AC | PRN
  Administered 2013-08-12: 125 mL via INTRAVENOUS

## 2013-10-15 ENCOUNTER — Encounter: Payer: Self-pay | Admitting: Dietician

## 2013-10-15 ENCOUNTER — Encounter: Payer: PRIVATE HEALTH INSURANCE | Attending: Internal Medicine | Admitting: Dietician

## 2013-10-15 VITALS — Ht 63.5 in | Wt 214.4 lb

## 2013-10-15 DIAGNOSIS — E669 Obesity, unspecified: Secondary | ICD-10-CM | POA: Insufficient documentation

## 2013-10-15 DIAGNOSIS — Z6837 Body mass index (BMI) 37.0-37.9, adult: Secondary | ICD-10-CM | POA: Diagnosis not present

## 2013-10-15 DIAGNOSIS — Z713 Dietary counseling and surveillance: Secondary | ICD-10-CM | POA: Diagnosis not present

## 2013-10-15 NOTE — Patient Instructions (Addendum)
Plan to call [facility]. Call Dr. Venita SheffieldHusain's office for help with a new psychiatrist referral.   Plan to walk everyday. Have a protein shake/drink within the first hour of waking up. Plan to fill half of your plate with vegetables. Limit starches (pasta) to a quarter of your plate.  Have 3-4 oz. Of protein at meals. Try to eat something every 3-5 hours you are awake (a protein and carbohydrate). Continue not having tempting foods around at home.
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
## 2013-11-09 ENCOUNTER — Encounter: Payer: Self-pay | Admitting: Dietician

## 2014-11-10 ENCOUNTER — Ambulatory Visit (INDEPENDENT_AMBULATORY_CARE_PROVIDER_SITE_OTHER): Payer: PRIVATE HEALTH INSURANCE | Admitting: Women's Health

## 2014-11-10 ENCOUNTER — Encounter: Payer: Self-pay | Admitting: Women's Health

## 2014-11-10 VITALS — BP 120/82 | Ht 63.0 in | Wt 232.0 lb

## 2014-11-10 DIAGNOSIS — M899 Disorder of bone, unspecified: Secondary | ICD-10-CM

## 2014-11-10 DIAGNOSIS — Z13820 Encounter for screening for osteoporosis: Secondary | ICD-10-CM

## 2014-11-10 DIAGNOSIS — Z01419 Encounter for gynecological examination (general) (routine) without abnormal findings: Secondary | ICD-10-CM | POA: Diagnosis not present

## 2014-11-10 DIAGNOSIS — M8580 Other specified disorders of bone density and structure, unspecified site: Secondary | ICD-10-CM

## 2014-11-10 NOTE — Progress Notes (Signed)
Latoya LeydenJulie C Clark 01/12/1960 [id number]    History:    Presents for annual exam.  Postmenopausal/no HRT/no bleeding. Rare sexual activity. Normal Pap and mammogram history. 2011 colonoscopy negative for polyps but colitis noted. Hypertension, sarcoid, hypothyroid primary care manages. Severe depression psychiatrist manages. 2013 T score -1.7 spine, 0.1 hip. History of a didelphys uterus. Having chronic back pain. Has gained almost 100 pounds in the past 2 years.  Past medical history, past surgical history, family history and social history were all reviewed and documented in the EPIC chart. 2 daughters both doing well, has 4 dogs. Husband recently had MI and stroke  from heart condition doing better. Sister with mental illness living with her. Mother committed suicide.  ROS:  A ROS was performed and pertinent positives and negatives are included.  Exam:  Filed Vitals:   11/10/14 0802  BP: 120/82    General appearance:  Normal Thyroid:  Symmetrical, normal in size, without palpable masses or nodularity. Respiratory  Auscultation:  Clear without wheezing or rhonchi Cardiovascular  Auscultation:  Regular rate, without rubs, murmurs or gallops  Edema/varicosities:  Not grossly evident Abdominal  Soft,nontender, without masses, guarding or rebound.  Liver/spleen:  No organomegaly noted  Hernia:  None appreciated  Skin  Inspection:  Grossly normal   Breasts: Examined lying and sitting.     Right: Without masses, retractions, discharge or axillary adenopathy.     Left: Without masses, retractions, discharge or axillary adenopathy. Gentitourinary   Inguinal/mons:  Normal without inguinal adenopathy  External genitalia:  Normal  BUS/Urethra/Skene's glands:  Normal  Vagina:  Normal  Cervix:  Normal  Uterus:   normal in size, shape and contour.  Midline and mobile  Adnexa/parametria:     Rt: Without masses or tenderness.   Lt: Without masses or tenderness.  Anus and  perineum: Normal  Digital rectal exam: Normal sphincter tone without palpated masses or tenderness  Assessment/Plan:  54 y.o. M WF G2 P2 for annual exam.    Postmenopausal/no HRT/no bleeding Hypertension/sarcoid/hypothyroid-primary care manages labs and meds Severe depression-psychiatrist manages Osteopenia without elevated FRAX Obesity Chronic low back pain and neck pain-orthopedist Smoker  Plan: Encouraged to inquire about Pneumovax at primary care at follow-up. Encouraged follow-up for low back severe pain at orthopedist. SBE's, continue annual screening mammogram overdue instructed to schedule, 3-D tomography encouraged has had intermittent mastodynia. Home safety, fall prevention discussed. Repeat DEXA, instructed to schedule. Reviewed importance of increasing leisure activities and exercise. Decrease calories for weight loss. Pap normal 2015, new screening guidelines reviewed. Aware of hazards of smoking.  Harrington ChallengerYOUNG,Dolores Ewing J Bay[facility] Heart And Vascular CenterWHNP, 10:23 AM 11/10/2014

## 2014-11-10 NOTE — Patient Instructions (Signed)
Menopause is a normal process in which your reproductive ability comes to an end. This process happens gradually over a span of months to years, usually between the ages of 48 and 55. Menopause is complete when you have missed 12 consecutive menstrual periods. It is important to talk with your health care provider about some of the most common conditions that affect postmenopausal women, such as heart disease, cancer, and bone loss (osteoporosis). Adopting a healthy lifestyle and getting preventive care can help to promote your health and wellness. Those actions can also lower your chances of developing some of these common conditions. WHAT SHOULD I KNOW ABOUT MENOPAUSE? During menopause, you may experience a number of symptoms, such as:  Moderate-to-severe hot flashes.  Night sweats.  Decrease in sex drive.  Mood swings.  Headaches.  Tiredness.  Irritability.  Memory problems.  Insomnia. Choosing to treat or not to treat menopausal changes is an individual decision that you make with your health care provider. WHAT SHOULD I KNOW ABOUT HORMONE REPLACEMENT THERAPY AND SUPPLEMENTS? Hormone therapy products are effective for treating symptoms that are associated with menopause, such as hot flashes and night sweats. Hormone replacement carries certain risks, especially as you become older. If you are thinking about using estrogen or estrogen with progestin treatments, discuss the benefits and risks with your health care provider. WHAT SHOULD I KNOW ABOUT HEART DISEASE AND STROKE? Heart disease, heart attack, and stroke become more likely as you age. This may be due, in part, to the hormonal changes that your body experiences during menopause. These can affect how your body processes dietary fats, triglycerides, and cholesterol. Heart attack and stroke are both medical emergencies. There are many things that you can do to help prevent heart disease and stroke:  Have your blood pressure  checked at least every 1-2 years. High blood pressure causes heart disease and increases the risk of stroke.  If you are 55-79 years old, ask your health care provider if you should take aspirin to prevent a heart attack or a stroke.  Do not use any tobacco products, including cigarettes, chewing tobacco, or electronic cigarettes. If you need help quitting, ask your health care provider.  It is important to eat a healthy diet and maintain a healthy weight.  Be sure to include plenty of vegetables, fruits, low-fat dairy products, and lean protein.  Avoid eating foods that are high in solid fats, added sugars, or salt (sodium).  Get regular exercise. This is one of the most important things that you can do for your health.  Try to exercise for at least 150 minutes each week. The type of exercise that you do should increase your heart rate and make you sweat. This is known as moderate-intensity exercise.  Try to do strengthening exercises at least twice each week. Do these in addition to the moderate-intensity exercise.  Know your numbers.Ask your health care provider to check your cholesterol and your blood glucose. Continue to have your blood tested as directed by your health care provider. WHAT SHOULD I KNOW ABOUT CANCER SCREENING? There are several types of cancer. Take the following steps to reduce your risk and to catch any cancer development as early as possible. Breast Cancer  Practice breast self-awareness.  This means understanding how your breasts normally appear and feel.  It also means doing regular breast self-exams. Let your health care provider know about any changes, no matter how small.  If you are 40 or older, have a clinician do a   breast exam (clinical breast exam or CBE) every year. Depending on your age, family history, and medical history, it may be recommended that you also have a yearly breast X-ray (mammogram).  If you have a family history of breast cancer,  talk with your health care provider about genetic screening.  If you are at high risk for breast cancer, talk with your health care provider about having an MRI and a mammogram every year.  Breast cancer (BRCA) gene test is recommended for women who have family members with BRCA-related cancers. Results of the assessment will determine the need for genetic counseling and BRCA1 and for BRCA2 testing. BRCA-related cancers include these types:  Breast. This occurs in males or females.  Ovarian.  Tubal. This may also be called fallopian tube cancer.  Cancer of the abdominal or pelvic lining (peritoneal cancer).  Prostate.  Pancreatic. Cervical, Uterine, and Ovarian Cancer Your health care provider may recommend that you be screened regularly for cancer of the pelvic organs. These include your ovaries, uterus, and vagina. This screening involves a pelvic exam, which includes checking for microscopic changes to the surface of your cervix (Pap test).  For women ages 21-65, health care providers may recommend a pelvic exam and a Pap test every three years. For women ages 77-65, they may recommend the Pap test and pelvic exam, combined with testing for human papilloma virus (HPV), every five years. Some types of HPV increase your risk of cervical cancer. Testing for HPV may also be done on women of any age who have unclear Pap test results.  Other health care providers may not recommend any screening for nonpregnant women who are considered low risk for pelvic cancer and have no symptoms. Ask your health care provider if a screening pelvic exam is right for you.  If you have had past treatment for cervical cancer or a condition that could lead to cancer, you need Pap tests and screening for cancer for at least 20 years after your treatment. If Pap tests have been discontinued for you, your risk factors (such as having a new sexual partner) need to be reassessed to determine if you should start having  screenings again. Some women have medical problems that increase the chance of getting cervical cancer. In these cases, your health care provider may recommend that you have screening and Pap tests more often.  If you have a family history of uterine cancer or ovarian cancer, talk with your health care provider about genetic screening.  If you have vaginal bleeding after reaching menopause, tell your health care provider.  There are currently no reliable tests available to screen for ovarian cancer. Lung Cancer Lung cancer screening is recommended for adults 3-70 years old who are at high risk for lung cancer because of a history of smoking. A yearly low-dose CT scan of the lungs is recommended if you:  Currently smoke.  Have a history of at least 30 pack-years of smoking and you currently smoke or have quit within the past 15 years. A pack-year is smoking an average of one pack of cigarettes per day for one year. Yearly screening should:  Continue until it has been 15 years since you quit.  Stop if you develop a health problem that would prevent you from having lung cancer treatment. Colorectal Cancer  This type of cancer can be detected and can often be prevented.  Routine colorectal cancer screening usually begins at age 38 and continues through age 12.  If you have  risk factors for colon cancer, your health care provider may recommend that you be screened at an earlier age.  If you have a family history of colorectal cancer, talk with your health care provider about genetic screening.  Your health care provider may also recommend using home test kits to check for hidden blood in your stool.  A small camera at the end of a tube can be used to examine your colon directly (sigmoidoscopy or colonoscopy). This is done to check for the earliest forms of colorectal cancer.  Direct examination of the colon should be repeated every 5-10 years until age 67. However, if early forms of  precancerous polyps or small growths are found or if you have a family history or genetic risk for colorectal cancer, you may need to be screened more often. Skin Cancer  Check your skin from head to toe regularly.  Monitor any moles. Be sure to tell your health care provider:  About any new moles or changes in moles, especially if there is a change in a mole's shape or color.  If you have a mole that is larger than the size of a pencil eraser.  If any of your family members has a history of skin cancer, especially at a Latoya Clark age, talk with your health care provider about genetic screening.  Always use sunscreen. Apply sunscreen liberally and repeatedly throughout the day.  Whenever you are outside, protect yourself by wearing long sleeves, pants, a wide-brimmed hat, and sunglasses. WHAT SHOULD I KNOW ABOUT OSTEOPOROSIS? Osteoporosis is a condition in which bone destruction happens more quickly than new bone creation. After menopause, you may be at an increased risk for osteoporosis. To help prevent osteoporosis or the bone fractures that can happen because of osteoporosis, the following is recommended:  If you are 39-61 years old, get at least 1,000 mg of calcium and at least 600 mg of vitamin D per day.  If you are older than age 16 but younger than age 7, get at least 1,200 mg of calcium and at least 600 mg of vitamin D per day.  If you are older than age 47, get at least 1,200 mg of calcium and at least 800 mg of vitamin D per day. Smoking and excessive alcohol intake increase the risk of osteoporosis. Eat foods that are rich in calcium and vitamin D, and do weight-bearing exercises several times each week as directed by your health care provider. WHAT SHOULD I KNOW ABOUT HOW MENOPAUSE AFFECTS Latoya Clark? Depression may occur at any age, but it is more common as you become older. Common symptoms of depression include:  Low or sad mood.  Changes in sleep patterns.  Changes  in appetite or eating patterns.  Feeling an overall lack of motivation or enjoyment of activities that you previously enjoyed.  Frequent crying spells. Talk with your health care provider if you think that you are experiencing depression. WHAT SHOULD I KNOW ABOUT IMMUNIZATIONS? It is important that you get and maintain your immunizations. These include:  Tetanus, diphtheria, and pertussis (Tdap) booster vaccine.  Influenza every year before the flu season begins.  Pneumonia vaccine.  Shingles vaccine. Your health care provider may also recommend other immunizations.   This information is not intended to replace advice given to you by your health care provider. Make sure you discuss any questions you have with your health care provider.   Document Released: 02/16/2005 Document Revised: 01/15/2014 Document Reviewed: 08/27/2013 Elsevier Interactive Patient Education 2016 Elsevier  Inc. Back Pain, Adult Back pain is very common in adults.The cause of back pain is rarely dangerous and the pain often gets better over time.The cause of your back pain may not be known. Some common causes of back pain include:  Strain of the muscles or ligaments supporting the spine.  Wear and tear (degeneration) of the spinal disks.  Arthritis.  Direct injury to the back. For many people, back pain may return. Since back pain is rarely dangerous, most people can learn to manage this condition on their own. HOME CARE INSTRUCTIONS Watch your back pain for any changes. The following actions may help to lessen any discomfort you are feeling:  Remain active. It is stressful on your back to sit or stand in one place for long periods of time. Do not sit, drive, or stand in one place for more than 30 minutes at a time. Take short walks on even surfaces as soon as you are able.Try to increase the length of time you walk each day.  Exercise regularly as directed by your health care provider. Exercise helps  your back heal faster. It also helps avoid future injury by keeping your muscles strong and flexible.  Do not stay in bed.Resting more than 1-2 days can delay your recovery.  Pay attention to your body when you bend and lift. The most comfortable positions are those that put less stress on your recovering back. Always use proper lifting techniques, including:  Bending your knees.  Keeping the load close to your body.  Avoiding twisting.  Find a comfortable position to sleep. Use a firm mattress and lie on your side with your knees slightly bent. If you lie on your back, put a pillow under your knees.  Avoid feeling anxious or stressed.Stress increases muscle tension and can worsen back pain.It is important to recognize when you are anxious or stressed and learn ways to manage it, such as with exercise.  Take medicines only as directed by your health care provider. Over-the-counter medicines to reduce pain and inflammation are often the most helpful.Your health care provider may prescribe muscle relaxant drugs.These medicines help dull your pain so you can more quickly return to your normal activities and healthy exercise.  Apply ice to the injured area:  Put ice in a plastic bag.  Place a towel between your skin and the bag.  Leave the ice on for 20 minutes, 2-3 times a day for the first 2-3 days. After that, ice and heat may be alternated to reduce pain and spasms.  Maintain a healthy weight. Excess weight puts extra stress on your back and makes it difficult to maintain good posture. SEEK MEDICAL CARE IF:  You have pain that is not relieved with rest or medicine.  You have increasing pain going down into the legs or buttocks.  You have pain that does not improve in one week.  You have night pain.  You lose weight.  You have a fever or chills. SEEK IMMEDIATE MEDICAL CARE IF:   You develop new bowel or bladder control problems.  You have unusual weakness or numbness  in your arms or legs.  You develop nausea or vomiting.  You develop abdominal pain.  You feel faint.   This information is not intended to replace advice given to you by your health care provider. Make sure you discuss any questions you have with your health care provider.   Document Released: 12/25/2004 Document Revised: 01/15/2014 Document Reviewed: 04/28/2013 Elsevier Interactive Patient Education   2016 [employer].

## 2014-11-11 LAB — URINALYSIS W MICROSCOPIC + REFLEX CULTURE
BACTERIA UA: NONE SEEN [HPF]
Bilirubin Urine: NEGATIVE
CASTS: NONE SEEN [LPF]
CRYSTALS: NONE SEEN [HPF]
Glucose, UA: NEGATIVE
HGB URINE DIPSTICK: NEGATIVE
Ketones, ur: NEGATIVE
Leukocytes, UA: NEGATIVE
Nitrite: NEGATIVE
Protein, ur: NEGATIVE
RBC / HPF: NONE SEEN RBC/HPF (ref <=2)
Specific Gravity, Urine: 1.021 (ref 1.001–1.035)
WBC, UA: NONE SEEN WBC/HPF (ref <=5)
YEAST: NONE SEEN [HPF]
pH: 5.5 (ref 5.0–8.0)

## 2015-04-08 ENCOUNTER — Other Ambulatory Visit: Payer: Self-pay | Admitting: Internal Medicine

## 2015-04-08 ENCOUNTER — Ambulatory Visit
Admission: RE | Admit: 2015-04-08 | Discharge: 2015-04-08 | Disposition: A | Discharge status: Home / Self Care | Payer: Managed Care, Other (non HMO) | Source: Ambulatory Visit | Attending: Internal Medicine | Admitting: Internal Medicine

## 2015-04-08 DIAGNOSIS — M25562 Pain in left knee: Secondary | ICD-10-CM

## 2015-04-08 DIAGNOSIS — R609 Edema, unspecified: Secondary | ICD-10-CM

## 2015-04-12 ENCOUNTER — Ambulatory Visit
Admission: RE | Admit: 2015-04-12 | Discharge: 2015-04-12 | Disposition: A | Discharge status: Home / Self Care | Payer: Managed Care, Other (non HMO) | Source: Ambulatory Visit | Attending: Internal Medicine | Admitting: Internal Medicine

## 2015-04-12 ENCOUNTER — Other Ambulatory Visit: Payer: Self-pay | Admitting: Internal Medicine

## 2015-04-12 DIAGNOSIS — R609 Edema, unspecified: Secondary | ICD-10-CM

## 2015-07-26 ENCOUNTER — Encounter: Payer: Self-pay | Admitting: Women's Health

## 2015-07-26 ENCOUNTER — Ambulatory Visit (INDEPENDENT_AMBULATORY_CARE_PROVIDER_SITE_OTHER): Payer: 59 | Admitting: Women's Health

## 2015-07-26 VITALS — BP 128/80 | Ht 63.0 in | Wt 232.0 lb

## 2015-07-26 DIAGNOSIS — K219 Gastro-esophageal reflux disease without esophagitis: Secondary | ICD-10-CM

## 2015-07-26 MED ORDER — DICYCLOMINE HCL 10 MG PO CAPS: 10.0000 mg | ORAL_CAPSULE | Freq: Three times a day (TID) | ORAL | Status: DC

## 2015-07-26 NOTE — Progress Notes (Signed)
Patient ID: Lizabeth LeydenJulie C Cottier, female   DOB: 01/20/1960, 55 y.o.   MRN: [id number] Presents with several complaints. States has had increased stomach/abdominal issues with increased gas, diarrhea 2-3 times daily, bloating, some nausea without vomiting. Symptoms have been present for years, have increased over the last 6 months. Reports negative colonoscopy in the past. 2014 cholecystectomy. Also having continual bilateral breast tenderness without visible change or palpable nodule. Normal annual mammogram. Has had increased stress over the past few months and is increased caffeine and diet. Sister with mental health issues continues to live with her causing problems, but sister has nowhere to go or finances to support herself independently.  Exam: Affect somewhat flat. Breast exam and sitting and lying position without palpable nodules, visible retractions or nipple discharge. Tenderness throughout exam bilaterally.  Mastodynia Probable IBS  Plan: Instructed to schedule appointment with Dr. Loreta AveMann for evaluation. Reviewed importance of decreasing caffeine in diet, vitamin D supplement twice a day, encouraged to increase leisure activities and regular exercise.

## 2015-07-26 NOTE — Patient Instructions (Signed)
Vit  E twice daily Diet for Irritable Bowel Syndrome When you have irritable bowel syndrome (IBS), the foods you eat and your eating habits are very important. IBS may cause various symptoms, such as abdominal pain, constipation, or diarrhea. Choosing the right foods can help ease discomfort caused by these symptoms. Work with your health care provider and dietitian to find the best eating plan to help control your symptoms. WHAT GENERAL GUIDELINES DO I NEED TO FOLLOW?  Keep a food diary. This will help you identify foods that cause symptoms. Write down:  What you eat and when.  What symptoms you have.  When symptoms occur in relation to your meals.  Avoid foods that cause symptoms. Talk with your dietitian about other ways to get the same nutrients that are in these foods.  Eat more foods that contain fiber. Take a fiber supplement if directed by your dietitian.  Eat your meals slowly, in a relaxed setting.  Aim to eat 5-6 small meals per day. Do not skip meals.  Drink enough fluids to keep your urine clear or pale yellow.  Ask your health care provider if you should take an over-the-counter probiotic during flare-ups to help restore healthy gut bacteria.  If you have cramping or diarrhea, try making your meals low in fat and high in carbohydrates. Examples of carbohydrates are pasta, rice, whole grain breads and cereals, fruits, and vegetables.  If dairy products cause your symptoms to flare up, try eating less of them. You might be able to handle yogurt better than other dairy products because it contains bacteria that help with digestion. WHAT FOODS ARE NOT RECOMMENDED? The following are some foods and drinks that may worsen your symptoms:  Fatty foods, such as [place] fries.  Milk products, such as cheese or ice cream.  Chocolate.  Alcohol.  Products with caffeine, such as coffee.  Carbonated drinks, such as soda. The items listed above may not be a complete list of  foods and beverages to avoid. Contact your dietitian for more information. WHAT FOODS ARE GOOD SOURCES OF FIBER? Your health care provider or dietitian may recommend that you eat more foods that contain fiber. Fiber can help reduce constipation and other IBS symptoms. Add foods with fiber to your diet a little at a time so that your body can get used to them. Too much fiber at once might cause gas and swelling of your abdomen. The following are some foods that are good sources of fiber:  Apples.  Peaches.  Pears.  Berries.  Figs.  Broccoli (raw).  Cabbage.  Carrots.  Raw peas.  Kidney beans.  Lima beans.  Whole grain bread.  Whole grain cereal. FOR MORE INFORMATION  International Foundation for Functional Gastrointestinal Disorders: www.iffgd.[employer] National Institute of Diabetes and Digestive and Kidney Diseases: http://norris-lawson.com/www.niddk.nih.gov/health-information/health-topics/digestive-diseases/ibs/Pages/facts.aspx   This information is not intended to replace advice given to you by your health care provider. Make sure you discuss any questions you have with your health care provider.   Document Released: 03/17/2003 Document Revised: 01/15/2014 Document Reviewed: 03/27/2013 Elsevier Interactive Patient Education 2016 Elsevier Inc. Breast Tenderness Breast tenderness is a common problem for women of all ages. Breast tenderness may cause mild discomfort to severe pain. It has a variety of causes. Your health care provider will find out the likely cause of your breast tenderness by examining your breasts, asking you about symptoms, and ordering some tests. Breast tenderness usually does not mean you have breast cancer. HOME CARE INSTRUCTIONS  Breast tenderness  often can be handled at home. You can try:  Getting fitted for a new bra that provides more support, especially during exercise.  Wearing a more supportive bra or sports bra while sleeping when your breasts are very tender.  If you  have a breast injury, apply ice to the area:  Put ice in a plastic bag.  Place a towel between your skin and the bag.  Leave the ice on for 20 minutes, 2-3 times a day.  If your breasts are too full of milk as a result of breastfeeding, try:  Expressing milk either by hand or with a breast pump.  Applying a warm compress to the breasts for relief.  Taking over-the-counter pain relievers, if approved by your health care provider.  Taking other medicines that your health care provider prescribes. These may include antibiotic medicines or birth control pills. Over the long term, your breast tenderness might be eased if you:  Cut down on caffeine.  Reduce the amount of fat in your diet. Keep a log of the days and times when your breasts are most tender. This will help you and your health care provider find the cause of the tenderness and how to relieve it. Also, learn how to do breast exams at home. This will help you notice if you have an unusual growth or lump that could cause tenderness. SEEK MEDICAL CARE IF:   Any part of your breast is hard, red, and hot to the touch. This could be a sign of infection.  Fluid is coming out of your nipples (and you are not breastfeeding). Especially watch for blood or pus.  You have a fever as well as breast tenderness.  You have a new or painful lump in your breast that remains after your menstrual period ends.  You have tried to take care of the pain at home, but it has not gone away.  Your breast pain is getting worse, or the pain is making it hard to do the things you usually do during your day.   This information is not intended to replace advice given to you by your health care provider. Make sure you discuss any questions you have with your health care provider.   Document Released: 12/08/2007 Document Revised: 08/27/2012 Document Reviewed: 07/24/2012 Elsevier Interactive Patient Education [employer]

## 2015-08-15 ENCOUNTER — Telehealth: Payer: Self-pay | Admitting: *Deleted

## 2015-08-15 NOTE — Telephone Encounter (Signed)
Pt saw nancy on 07/26/15 pt forgot the name of GI recommended and wanted phone number as well. I called pt back and left message on her voicemail with Dr.Mann # [phone]

## 2015-09-09 ENCOUNTER — Emergency Department (HOSPITAL_COMMUNITY)
Admission: EM | Admit: 2015-09-09 | Discharge: 2015-09-09 | Disposition: A | Discharge status: Home / Self Care | Payer: Managed Care, Other (non HMO) | Attending: Emergency Medicine | Admitting: Emergency Medicine

## 2015-09-09 ENCOUNTER — Emergency Department (HOSPITAL_COMMUNITY): Payer: Managed Care, Other (non HMO)

## 2015-09-09 ENCOUNTER — Encounter (HOSPITAL_COMMUNITY): Payer: Self-pay | Admitting: *Deleted

## 2015-09-09 DIAGNOSIS — J449 Chronic obstructive pulmonary disease, unspecified: Secondary | ICD-10-CM | POA: Diagnosis not present

## 2015-09-09 DIAGNOSIS — R51 Headache: Secondary | ICD-10-CM | POA: Insufficient documentation

## 2015-09-09 DIAGNOSIS — I10 Essential (primary) hypertension: Secondary | ICD-10-CM | POA: Insufficient documentation

## 2015-09-09 DIAGNOSIS — E039 Hypothyroidism, unspecified: Secondary | ICD-10-CM | POA: Insufficient documentation

## 2015-09-09 DIAGNOSIS — R1013 Epigastric pain: Secondary | ICD-10-CM | POA: Diagnosis present

## 2015-09-09 DIAGNOSIS — K219 Gastro-esophageal reflux disease without esophagitis: Secondary | ICD-10-CM | POA: Insufficient documentation

## 2015-09-09 DIAGNOSIS — R519 Headache, unspecified: Secondary | ICD-10-CM

## 2015-09-09 DIAGNOSIS — F17210 Nicotine dependence, cigarettes, uncomplicated: Secondary | ICD-10-CM | POA: Diagnosis not present

## 2015-09-09 DIAGNOSIS — M542 Cervicalgia: Secondary | ICD-10-CM | POA: Diagnosis not present

## 2015-09-09 DIAGNOSIS — R0602 Shortness of breath: Secondary | ICD-10-CM | POA: Diagnosis not present

## 2015-09-09 DIAGNOSIS — Z79899 Other long term (current) drug therapy: Secondary | ICD-10-CM | POA: Insufficient documentation

## 2015-09-09 HISTORY — DX: Disorder of thyroid, unspecified: E07.9

## 2015-09-09 HISTORY — DX: Personal history of other diseases of the digestive system: Z87.19

## 2015-09-09 LAB — COMPREHENSIVE METABOLIC PANEL
ALT: 20 U/L (ref 14–54)
AST: 21 U/L (ref 15–41)
Albumin: 4.1 g/dL (ref 3.5–5.0)
Alkaline Phosphatase: 45 U/L (ref 38–126)
Anion gap: 9 (ref 5–15)
BILIRUBIN TOTAL: 0.7 mg/dL (ref 0.3–1.2)
BUN: 9 mg/dL (ref 6–20)
CO2: 27 mmol/L (ref 22–32)
Calcium: 8.8 mg/dL — ABNORMAL LOW (ref 8.9–10.3)
Chloride: 104 mmol/L (ref 101–111)
Creatinine, Ser: 0.77 mg/dL (ref 0.44–1.00)
GFR calc Af Amer: >60 mL/min (ref >60)
Glucose, Bld: 100 mg/dL — ABNORMAL HIGH (ref 65–99)
Potassium: 3.9 mmol/L (ref 3.5–5.1)
Sodium: 140 mmol/L (ref 135–145)
TOTAL PROTEIN: 7.1 g/dL (ref 6.5–8.1)

## 2015-09-09 LAB — CBC WITH DIFFERENTIAL/PLATELET
Basophils Absolute: 0 10*3/uL (ref 0.0–0.1)
Basophils Relative: 0 %
Eosinophils Absolute: 0.1 10*3/uL (ref 0.0–0.7)
Eosinophils Relative: 1 %
HCT: 40.9 % (ref 36.0–46.0)
Hemoglobin: 13.5 g/dL (ref 12.0–15.0)
Lymphocytes Relative: 38 %
Lymphs Abs: 3.2 10*3/uL (ref 0.7–4.0)
MCH: 29.9 pg (ref 26.0–34.0)
MCHC: 33 g/dL (ref 30.0–36.0)
MCV: 90.7 fL (ref 78.0–100.0)
Monocytes Absolute: 0.6 10*3/uL (ref 0.1–1.0)
Monocytes Relative: 7 %
Neutro Abs: 4.7 10*3/uL (ref 1.7–7.7)
Neutrophils Relative %: 54 %
Platelets: 250 10*3/uL (ref 150–400)
RBC: 4.51 MIL/uL (ref 3.87–5.11)
RDW: 13.5 % (ref 11.5–15.5)
WBC: 8.5 10*3/uL (ref 4.0–10.5)

## 2015-09-09 LAB — I-STAT TROPONIN, ED: Troponin i, poc: 0 ng/mL (ref 0.00–0.08)

## 2015-09-09 LAB — LIPASE, BLOOD: Lipase: 26 U/L (ref 11–51)

## 2015-09-09 MED ORDER — GI COCKTAIL ~~LOC~~
30.0000 mL | Freq: Once | ORAL | Status: AC
  Administered 2015-09-09: 30 mL via ORAL
  Filled 2015-09-09: qty 30

## 2015-09-09 MED ORDER — METOCLOPRAMIDE HCL 5 MG/ML IJ SOLN
10.0000 mg | Freq: Once | INTRAMUSCULAR | Status: DC
  Filled 2015-09-09: qty 2

## 2015-09-09 MED ORDER — ORPHENADRINE CITRATE ER 100 MG PO TB12: 100.0000 mg | ORAL_TABLET | Freq: Two times a day (BID) | ORAL | 0 refills | Status: DC | PRN

## 2015-09-09 MED ORDER — PANTOPRAZOLE SODIUM 20 MG PO TBEC: 20.0000 mg | DELAYED_RELEASE_TABLET | Freq: Every day | ORAL | 0 refills | Status: DC

## 2015-09-09 MED ORDER — TIZANIDINE HCL 2 MG PO TABS
2.0000 mg | ORAL_TABLET | Freq: Once | ORAL | Status: AC
  Administered 2015-09-09: 2 mg via ORAL
  Filled 2015-09-09: qty 1

## 2015-09-09 MED ORDER — BUTALBITAL-APAP-CAFFEINE 50-325-40 MG PO TABS
1.0000 | ORAL_TABLET | Freq: Once | ORAL | Status: AC
  Administered 2015-09-09: 1 via ORAL
  Filled 2015-09-09: qty 1

## 2015-09-09 NOTE — ED Notes (Signed)
Pain is centered around BILclavicle,  Lateral neck, upper back that radiates up the back of neck. Not to the front of head. Neuro intact at present

## 2015-09-09 NOTE — ED Provider Notes (Signed)
[facility]-EMERGENCY DEPT Provider Note   CSN: [id number] Arrival date & time: 09/09/15  1258     History   Chief Complaint Chief Complaint  Patient presents with  . Headache  . Neck Pain    HPI Latoya Clark is a 55 y.o. female with history of anxiety, depression, IBS, thyroidectomy who presents with a three-day history of anterior, posterior neck pain, thoracic back pain, difficulty swallowing, headache, shortness of breath. Patient states her pain began with a mild pain on her left anterior neck and has progressed to bilateral knee and midline anterior and posterior neck pain with a headache posteriorly. Patient denies any new vision changes. Patient states she has been present intermittently for a long time. Patient states she took Bentyl for the first time yesterday and has experienced a burning epigastric pain since. Patient has tried a heating pad at home without relief. Patient denies any fevers, chest pain, nausea, vomiting, bloody stools. The patient has chronic diarrhea.  HPI  Past Medical History:  Diagnosis Date  . Active smoker    1 PPD  . Anxiety   . Arthritis   . Colitis   . Depression   . Diverticulitis   . History of IBS   . Hypertension   . Insomnia   . Mastodynia   . Sarcoid (HCC)   . Thyroid disease   . UTI (lower urinary tract infection)   . Vaginal septum     Patient Active Problem List   Diagnosis Date Noted  . Calculus of gallbladder with other cholecystitis, without mention of obstruction 09/30/2012  . Osteopenia 01/29/2011  . Arthritis   . Active smoker   . Mastodynia   . Vaginal septum   . Insomnia   . CARPAL TUNNEL SYNDROME, BILATERAL 07/01/2008  . 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 SORE THROAT 02/19/2008  . FATIGUE 02/19/2008  . CHEST WALL PAIN, ANTERIOR 02/19/2008  . ANXIETY DISORDER, ACUTE 11/07/2007  . HEADACHE 09/05/2007  . TOXIC EFFECT OF VENOM 08/19/2007  . ANGULAR CHEILITIS 07/31/2007  . HYPOTHYROIDISM 06/24/2007  .  DEPRESSION 06/24/2007  . HYPERTENSION 06/24/2007  . COPD 06/24/2007  . LOW BACK PAIN 06/24/2007    Past Surgical History:  Procedure Laterality Date  . CESAREAN SECTION  1981, 1984  . CHOLECYSTECTOMY N/A 01/05/2013   Procedure: LAPAROSCOPIC CHOLECYSTECTOMY WITH attempted INTRAOPERATIVE CHOLANGIOGRAM;  Surgeon: Atilano InaEric M Wilson, MD;  [facility];  Service: General;  Laterality: N/A;  . COPD    . HYSTEROSCOPY  02/13/07   HYSTEROSCOPY, D&C, MIRENA INSERTED  . REPLACEMENT TOTAL KNEE Right 05/12  . THYROIDECTOMY  2002    OB History    Gravida Para Term Preterm AB Living   2 2 2     2    SAB TAB Ectopic Multiple Live Births                   Home Medications    Prior to Admission medications   Medication Sig Start Date End Date Taking? Authorizing Provider  amoxicillin (AMOXIL) 500 MG capsule Take 2,000 mg by mouth. 1 hour prior to dental appointments.   Yes Historical Provider, MD  Budesonide (UCERIS) 9 MG TB24 Take 9 mg by mouth daily. Reported on 07/26/2015   Yes Historical Provider, MD  buPROPion (WELLBUTRIN XL) 150 MG 24 hr tablet Take 450 mg by mouth daily.   Yes Historical Provider, MD  clonazePAM (KLONOPIN) 1 MG tablet Take 1 mg by mouth 2 (two) times daily as needed for anxiety.  Yes Historical Provider, MD  colestipol (COLESTID) 1 g tablet Take 2 g by mouth at bedtime.   Yes Historical Provider, MD  dicyclomine (BENTYL) 10 MG capsule Take 1 capsule (10 mg total) by mouth 4 (four) times daily -  before meals and at bedtime. 07/26/15  Yes Harrington Challenger, NP  divalproex (DEPAKOTE ER) 500 MG 24 hr tablet Take 1,000 mg by mouth at bedtime.    Yes Historical Provider, MD  escitalopram (LEXAPRO) 20 MG tablet Take 20 mg by mouth daily.   Yes Historical Provider, MD  losartan (COZAAR) 50 MG tablet Take 50 mg by mouth daily. 07/03/15  Yes Historical Provider, MD  SYNTHROID 125 MCG tablet Take 1 tablet by mouth daily. 07/10/15  Yes Historical Provider, MD  traZODone (DESYREL) 100 MG  tablet Take 100 mg by mouth at bedtime as needed for sleep.  10/16/14  Yes Historical Provider, MD  orphenadrine (NORFLEX) 100 MG tablet Take 1 tablet (100 mg total) by mouth 2 (two) times daily as needed for muscle spasms. 09/09/15   Katelinn Justice M Torre [place], PA-C  pantoprazole (PROTONIX) 20 MG tablet Take 1 tablet (20 mg total) by mouth daily. 09/09/15   Emi Holes, PA-C    Family History Family History  Problem Relation Age of Onset  . Depression Mother   . Anxiety disorder Mother   . Depression Father   . Cancer Father     angioplasty thyroid carcenoma  . Diabetes Father   . Hypertension Father   . Thyroid disease Father   . Bipolar disorder Sister   . Hypertension Sister   . Schizophrenia Maternal Grandfather   . Depression Maternal Grandmother   . Anxiety disorder Maternal Grandmother   . Anxiety disorder Paternal Grandfather   . Depression Paternal Grandfather   . Diabetes Paternal Grandfather   . Anxiety disorder Paternal Grandmother   . Depression Paternal Grandmother   . Cancer Paternal Grandmother     stomach  . Diabetes Paternal Grandmother     Social History Social History  Substance Use Topics  . Smoking status: Current Every Day Smoker    Packs/day: 1.00    Years: 15.00    Types: Cigarettes  . Smokeless tobacco: Never Used     Comment: occ alcohol  . Alcohol use 0.0 oz/week     Comment: Rare     Allergies   Prednisone and Codeine   Review of Systems Review of Systems  Constitutional: Negative for chills and fever.  HENT: Positive for sore throat and trouble swallowing. Negative for facial swelling.   Respiratory: Positive for cough (periodic) and shortness of breath.   Cardiovascular: Negative for chest pain.  Gastrointestinal: Positive for abdominal pain. Negative for nausea and vomiting.  Genitourinary: Negative for dysuria.  Musculoskeletal: Positive for back pain, neck pain and neck stiffness.  Skin: Negative for rash and wound.  Neurological:  Negative for headaches.  Psychiatric/Behavioral: The patient is not nervous/anxious.      Physical Exam Updated Vital Signs BP 128/87   Pulse 80   Temp 98.1 F (36.7 C) (Oral)   Resp 11   Ht 5' 3.5" (1.613 m)   Wt 98.9 kg   SpO2 90%   BMI 38.01 kg/m   Physical Exam  Constitutional: She appears well-developed and well-nourished. No distress.  HENT:  Head: Normocephalic and atraumatic.  Mouth/Throat: Oropharynx is clear and moist. No oropharyngeal exudate.  Eyes: Conjunctivae and EOM are normal. Pupils are equal, round, and reactive to light. Right eye  exhibits no discharge. Left eye exhibits no discharge. No scleral icterus.  Neck: Spinous process tenderness present. Neck rigidity present. Decreased range of motion present. No thyromegaly present.  Cardiovascular: Normal rate, regular rhythm, normal heart sounds and intact distal pulses.  Exam reveals no gallop and no friction rub.   No murmur heard. Pulmonary/Chest: Effort normal and breath sounds normal. No stridor. No respiratory distress. She has no wheezes. She has no rales.  Abdominal: Soft. Bowel sounds are normal. She exhibits no distension. There is tenderness in the epigastric area. There is no rebound and no guarding.  Musculoskeletal: She exhibits no edema.       Back:  Lymphadenopathy:    She has no cervical adenopathy.  Neurological: She is alert. Coordination normal.  CN 3-12 intact; normal sensation throughout; 5/5 strength in all 4 extremities; equal bilateral grip strength; no ataxia on finger-to-nose; no pronator drift   Skin: Skin is warm and dry. No rash noted. She is not diaphoretic. No pallor.  Psychiatric: She has a normal mood and affect.  Nursing note and vitals reviewed.    ED Treatments / Results  Labs (all labs ordered are listed, but only abnormal results are displayed) Labs Reviewed  COMPREHENSIVE METABOLIC PANEL - Abnormal; Notable for the following:       Result Value   Glucose, Bld 100  (*)    Calcium 8.8 (*)    All other components within normal limits  CBC WITH DIFFERENTIAL/PLATELET  LIPASE, BLOOD  I-STAT TROPOININ, ED    EKG  EKG Interpretation  Date/Time:  Friday September 09 2015 14:22:52 EDT Ventricular Rate:  81 PR Interval:    QRS Duration: 90 QT Interval:  390 QTC Calculation: 453 R Axis:   -19 Text Interpretation:  Sinus rhythm Borderline left axis deviation Low voltage, precordial leads Confirmed by Lincoln Brigham [phone]) on 09/09/2015 2:45:21 PM Also confirmed by Lincoln Brigham [phone]), editor [place], [place] [phone])  on 09/09/2015 3:01:48 PM       Radiology Dg Chest 2 View  Result Date: 09/09/2015 CLINICAL DATA:  Dysphagia EXAM: CHEST  2 VIEW COMPARISON:  December 26, 2012 FINDINGS: There is atelectatic change in the left base. Lungs elsewhere are clear. Heart size and pulmonary vascularity are normal. No adenopathy. Aorta is tortuous with atherosclerotic calcification in the arch region. There is degenerative change in the thoracic spine. There is postoperative change at the cervical -thoracic junction region. IMPRESSION: Left base atelectasis. Lungs elsewhere clear. There is aortic atherosclerosis. Electronically Signed   By: Bretta Bang III M.D.   On: 09/09/2015 15:30    Procedures Procedures (including critical care time)  Medications Ordered in ED Medications  gi cocktail (Maalox,Lidocaine,Donnatal) (30 mLs Oral Given 09/09/15 1538)  tiZANidine (ZANAFLEX) tablet 2 mg (2 mg Oral Given 09/09/15 1558)  butalbital-acetaminophen-caffeine (FIORICET, ESGIC) 50-325-40 MG per tablet 1 tablet (1 tablet Oral Given 09/09/15 1603)     Initial Impression / Assessment and Plan / ED Course  I have reviewed the triage vital signs and the nursing notes.  Pertinent labs & imaging results that were available during my care of the patient were reviewed by me and considered in my medical decision making (see chart for details).  Clinical Course    CBC unremarkable. CMP  shows glucose 100, calcium 8.8. Lipase 26. Troponin 0.0. CXR shows left base atelectasis; lungs elsewhere clear; aortic atherosclerosis. EKG shows Sinus rhythm; Borderline left axis deviation; Low voltage, precordial leads. Patient feeling better prior to receiving all treatments in ED (  Fioricet, GI cocktail, Zanaflex). Patient had only received GI cocktail with some resolution of symptoms. Patient with shortness of breath and low oxygen saturations at baseline after further discussion by Dr. Madilyn Hook. Dr. Madilyn Hook doubts any serious pathology. Patient follow-up with PCP and GI as needed. Patient discharged with Norflex and Protonix. Patient evaluated by Dr. Madilyn Hook who guided the patient's management and agrees with plan. Return precautions discussed. Patient understands and agrees with plan. Patient stable throughout ED course and discharged in satisfactory condition.  Final Clinical Impressions(s) / ED Diagnoses   Final diagnoses:  Neck pain  Bad headache  Shortness of breath  Gastroesophageal reflux disease, esophagitis presence not specified    New Prescriptions New Prescriptions   ORPHENADRINE (NORFLEX) 100 MG TABLET    Take 1 tablet (100 mg total) by mouth 2 (two) times daily as needed for muscle spasms.   PANTOPRAZOLE (PROTONIX) 20 MG TABLET    Take 1 tablet (20 mg total) by mouth daily.     Emi Holes, PA-C 09/09/15 1611    Tilden Fossa, MD 09/11/15 [phone]

## 2015-09-09 NOTE — ED Notes (Signed)
Pt informed will need to wait for ride in triage

## 2015-09-09 NOTE — ED Notes (Signed)
Rayland NT informed this Clinical research associatewriter pt was calling ride

## 2015-09-09 NOTE — ED Notes (Signed)
ED PA at bedside

## 2015-09-09 NOTE — ED Triage Notes (Signed)
Pt complains of headache and neck pain for the past 3 days. Pt denies fever. Pt states she has pain while swallowing. Pt states her throat is not sore.

## 2015-09-09 NOTE — Discharge Instructions (Signed)
Medications: Protonix, Norflex  Treatment: Take Protonix once daily for your most likely reflux symptoms. Take Norflex twice daily as needed for muscle spasms and pain. You can take Tylenol or ibuprofen as prescribed over-the-counter for your pain.  Follow-up: Please follow up with your primary care provider for further evaluation and treatment. Please return to the emergency department if you develop any new or worsening symptoms.

## 2015-09-09 NOTE — ED Notes (Signed)
03 sats 90% pt states doctor took oxygen off. Pt without complaints.

## 2015-09-09 NOTE — ED Notes (Signed)
Madilyn Hookees MD at bedside reports attempt PO medications. NO IV OR IV MEDS.

## 2015-11-14 ENCOUNTER — Other Ambulatory Visit: Payer: Self-pay | Admitting: Nurse Practitioner

## 2015-11-14 ENCOUNTER — Ambulatory Visit
Admission: RE | Admit: 2015-11-14 | Discharge: 2015-11-14 | Disposition: A | Discharge status: Home / Self Care | Payer: Managed Care, Other (non HMO) | Source: Ambulatory Visit | Attending: Nurse Practitioner | Admitting: Nurse Practitioner

## 2015-11-14 DIAGNOSIS — S51851A Open bite of right forearm, initial encounter: Secondary | ICD-10-CM

## 2015-11-23 ENCOUNTER — Other Ambulatory Visit: Payer: Self-pay | Admitting: Gastroenterology

## 2015-11-23 DIAGNOSIS — R10814 Left lower quadrant abdominal tenderness: Secondary | ICD-10-CM

## 2015-11-30 ENCOUNTER — Other Ambulatory Visit: Payer: Managed Care, Other (non HMO)

## 2015-12-06 ENCOUNTER — Ambulatory Visit
Admission: RE | Admit: 2015-12-06 | Discharge: 2015-12-06 | Disposition: A | Discharge status: Home / Self Care | Payer: Managed Care, Other (non HMO) | Source: Ambulatory Visit | Attending: Gastroenterology | Admitting: Gastroenterology

## 2015-12-06 DIAGNOSIS — R10814 Left lower quadrant abdominal tenderness: Secondary | ICD-10-CM

## 2015-12-06 MED ORDER — IOPAMIDOL (ISOVUE-300) INJECTION 61%
125.0000 mL | Freq: Once | INTRAVENOUS | Status: AC | PRN
  Administered 2015-12-06: 125 mL via INTRAVENOUS

## 2016-02-21 ENCOUNTER — Emergency Department (HOSPITAL_COMMUNITY): Payer: 59

## 2016-02-21 ENCOUNTER — Emergency Department (HOSPITAL_COMMUNITY)
Admission: EM | Admit: 2016-02-21 | Discharge: 2016-02-21 | Disposition: A | Discharge status: Home / Self Care | Payer: 59 | Attending: Emergency Medicine | Admitting: Emergency Medicine

## 2016-02-21 ENCOUNTER — Encounter (HOSPITAL_COMMUNITY): Payer: Self-pay | Admitting: Emergency Medicine

## 2016-02-21 DIAGNOSIS — F329 Major depressive disorder, single episode, unspecified: Secondary | ICD-10-CM | POA: Diagnosis not present

## 2016-02-21 DIAGNOSIS — I10 Essential (primary) hypertension: Secondary | ICD-10-CM | POA: Diagnosis not present

## 2016-02-21 DIAGNOSIS — E039 Hypothyroidism, unspecified: Secondary | ICD-10-CM | POA: Diagnosis present

## 2016-02-21 DIAGNOSIS — F419 Anxiety disorder, unspecified: Secondary | ICD-10-CM | POA: Diagnosis not present

## 2016-02-21 DIAGNOSIS — R079 Chest pain, unspecified: Secondary | ICD-10-CM | POA: Diagnosis present

## 2016-02-21 DIAGNOSIS — D860 Sarcoidosis of lung: Secondary | ICD-10-CM | POA: Diagnosis present

## 2016-02-21 DIAGNOSIS — Z79899 Other long term (current) drug therapy: Secondary | ICD-10-CM | POA: Diagnosis not present

## 2016-02-21 DIAGNOSIS — R0789 Other chest pain: Secondary | ICD-10-CM | POA: Insufficient documentation

## 2016-02-21 DIAGNOSIS — F17210 Nicotine dependence, cigarettes, uncomplicated: Secondary | ICD-10-CM | POA: Diagnosis not present

## 2016-02-21 DIAGNOSIS — F17200 Nicotine dependence, unspecified, uncomplicated: Secondary | ICD-10-CM | POA: Diagnosis not present

## 2016-02-21 DIAGNOSIS — Z96651 Presence of right artificial knee joint: Secondary | ICD-10-CM | POA: Diagnosis not present

## 2016-02-21 DIAGNOSIS — F32A Depression, unspecified: Secondary | ICD-10-CM | POA: Diagnosis not present

## 2016-02-21 DIAGNOSIS — J449 Chronic obstructive pulmonary disease, unspecified: Secondary | ICD-10-CM | POA: Insufficient documentation

## 2016-02-21 DIAGNOSIS — K219 Gastro-esophageal reflux disease without esophagitis: Secondary | ICD-10-CM | POA: Diagnosis present

## 2016-02-21 LAB — CBC
HEMATOCRIT: 37.1 % (ref 36.0–46.0)
HEMOGLOBIN: 12.2 g/dL (ref 12.0–15.0)
MCH: 29.5 pg (ref 26.0–34.0)
MCHC: 32.9 g/dL (ref 30.0–36.0)
MCV: 89.6 fL (ref 78.0–100.0)
Platelets: 202 10*3/uL (ref 150–400)
RBC: 4.14 MIL/uL (ref 3.87–5.11)
RDW: 13.2 % (ref 11.5–15.5)
WBC: 7.4 10*3/uL (ref 4.0–10.5)

## 2016-02-21 LAB — BASIC METABOLIC PANEL
ANION GAP: 10 (ref 5–15)
BUN: 7 mg/dL (ref 6–20)
CO2: 27 mmol/L (ref 22–32)
Calcium: 8.8 mg/dL — ABNORMAL LOW (ref 8.9–10.3)
Chloride: 100 mmol/L — ABNORMAL LOW (ref 101–111)
Creatinine, Ser: 0.75 mg/dL (ref 0.44–1.00)
GFR calc Af Amer: >60 mL/min (ref >60)
GLUCOSE: 91 mg/dL (ref 65–99)
POTASSIUM: 3.8 mmol/L (ref 3.5–5.1)
Sodium: 137 mmol/L (ref 135–145)

## 2016-02-21 LAB — I-STAT TROPONIN, ED: Troponin i, poc: 0 ng/mL (ref 0.00–0.08)

## 2016-02-21 LAB — TSH: TSH: 1.947 u[IU]/mL (ref 0.350–4.500)

## 2016-02-21 MED ORDER — GI COCKTAIL ~~LOC~~
30.0000 mL | Freq: Once | ORAL | Status: AC
  Administered 2016-02-21: 30 mL via ORAL
  Filled 2016-02-21: qty 30

## 2016-02-21 MED ORDER — SODIUM CHLORIDE 0.9 % IV BOLUS (SEPSIS)
1000.0000 mL | Freq: Once | INTRAVENOUS | Status: AC
  Administered 2016-02-21: 1000 mL via INTRAVENOUS

## 2016-02-21 MED ORDER — MORPHINE SULFATE (PF) 4 MG/ML IV SOLN
4.0000 mg | Freq: Once | INTRAVENOUS | Status: AC
  Administered 2016-02-21: 4 mg via INTRAVENOUS
  Filled 2016-02-21: qty 1

## 2016-02-21 MED ORDER — ONDANSETRON HCL 4 MG/2ML IJ SOLN
4.0000 mg | Freq: Once | INTRAMUSCULAR | Status: AC
  Administered 2016-02-21: 4 mg via INTRAVENOUS
  Filled 2016-02-21: qty 2

## 2016-02-21 NOTE — ED Triage Notes (Signed)
Per [facility] for [facility].  Patient was seated waiting for doctor when she states she felt sudden on set of central chest heaviness followed by heaviness in her left arm.  Strengths equal bilaterally.  324mg  aspirin received PTA.  20g saline lock in left AC.   Patient alert and oriented at this time.

## 2016-02-21 NOTE — ED Provider Notes (Signed)
[facility]-EMERGENCY DEPT Provider Note   CSN: [id number] Arrival date & time: 02/21/16  1401     History   Chief Complaint Chief Complaint  Patient presents with  . Chest Pain    HPI ODETTA FORNESS is a 56 y.o. female.  Intermittent chest pain for 2-3 weeks described as a heaviness not associated with any activity. Pain is located under the left breast. She was at the gastroenterologist Deboraha Sprang) today when symptoms persisted and she was transferred to the ED. 4 baby aspirin were given. No previous cardiac history. ROS positive for left arm heaviness, dizziness, dyspnea. No diaphoresis or nausea. Risk factors include smoking, hypertension.      Past Medical History:  Diagnosis Date  . Active smoker    1 PPD  . Anxiety   . Arthritis   . Colitis   . Depression   . Diverticulitis   . History of IBS   . Hypertension   . Insomnia   . Mastodynia   . Sarcoid (HCC)   . Thyroid disease   . UTI (lower urinary tract infection)   . Vaginal septum     Patient Active Problem List   Diagnosis Date Noted  . Calculus of gallbladder with other cholecystitis, without mention of obstruction 09/30/2012  . Osteopenia 01/29/2011  . Arthritis   . Active smoker   . Mastodynia   . Vaginal septum   . Insomnia   . CARPAL TUNNEL SYNDROME, BILATERAL 07/01/2008  . 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 SORE THROAT 02/19/2008  . FATIGUE 02/19/2008  . CHEST WALL PAIN, ANTERIOR 02/19/2008  . ANXIETY DISORDER, ACUTE 11/07/2007  . HEADACHE 09/05/2007  . TOXIC EFFECT OF VENOM 08/19/2007  . ANGULAR CHEILITIS 07/31/2007  . HYPOTHYROIDISM 06/24/2007  . DEPRESSION 06/24/2007  . HYPERTENSION 06/24/2007  . COPD 06/24/2007  . LOW BACK PAIN 06/24/2007    Past Surgical History:  Procedure Laterality Date  . CESAREAN SECTION  1981, 1984  . CHOLECYSTECTOMY N/A 01/05/2013   Procedure: LAPAROSCOPIC CHOLECYSTECTOMY WITH attempted INTRAOPERATIVE CHOLANGIOGRAM;  Surgeon: Atilano Ina, MD;  Location: [facility]  OR;  Service: General;  Laterality: N/A;  . COPD    . HYSTEROSCOPY  02/13/07   HYSTEROSCOPY, D&C, MIRENA INSERTED  . REPLACEMENT TOTAL KNEE Right 05/12  . THYROIDECTOMY  2002  . TONSILLECTOMY      OB History    Gravida Para Term Preterm AB Living   2 2 2     2    SAB TAB Ectopic Multiple Live Births                   Home Medications    Prior to Admission medications   Medication Sig Start Date End Date Taking? Authorizing Provider  amoxicillin (AMOXIL) 500 MG capsule Take 2,000 mg by mouth. 1 hour prior to dental appointments.    Historical Provider, MD  Budesonide (UCERIS) 9 MG TB24 Take 9 mg by mouth daily. Reported on 07/26/2015    Historical Provider, MD  buPROPion (WELLBUTRIN XL) 150 MG 24 hr tablet Take 450 mg by mouth daily.    Historical Provider, MD  clonazePAM (KLONOPIN) 1 MG tablet Take 1 mg by mouth 2 (two) times daily as needed for anxiety.     Historical Provider, MD  colestipol (COLESTID) 1 g tablet Take 2 g by mouth at bedtime.    Historical Provider, MD  dicyclomine (BENTYL) 10 MG capsule Take 1 capsule (10 mg total) by mouth 4 (four) times daily -  before meals and  at bedtime. 07/26/15   Harrington Challenger, NP  divalproex (DEPAKOTE ER) 500 MG 24 hr tablet Take 1,000 mg by mouth at bedtime.     Historical Provider, MD  escitalopram (LEXAPRO) 20 MG tablet Take 20 mg by mouth daily.    Historical Provider, MD  losartan (COZAAR) 50 MG tablet Take 50 mg by mouth daily. 07/03/15   Historical Provider, MD  orphenadrine (NORFLEX) 100 MG tablet Take 1 tablet (100 mg total) by mouth 2 (two) times daily as needed for muscle spasms. 09/09/15   Alexandra M Law, PA-C  pantoprazole (PROTONIX) 20 MG tablet Take 1 tablet (20 mg total) by mouth daily. 09/09/15   Alexandra M Law, PA-C  SYNTHROID 125 MCG tablet Take 1 tablet by mouth daily. 07/10/15   Historical Provider, MD  traZODone (DESYREL) 100 MG tablet Take 100 mg by mouth at bedtime as needed for sleep.  10/16/14   Historical Provider, MD     Family History Family History  Problem Relation Age of Onset  . Depression Mother   . Anxiety disorder Mother   . Depression Father   . Cancer Father     angioplasty thyroid carcenoma  . Diabetes Father   . Hypertension Father   . Thyroid disease Father   . Bipolar disorder Sister   . Hypertension Sister   . Schizophrenia Maternal Grandfather   . Depression Maternal Grandmother   . Anxiety disorder Maternal Grandmother   . Anxiety disorder Paternal Grandfather   . Depression Paternal Grandfather   . Diabetes Paternal Grandfather   . Anxiety disorder Paternal Grandmother   . Depression Paternal Grandmother   . Cancer Paternal Grandmother     stomach  . Diabetes Paternal Grandmother     Social History Social History  Substance Use Topics  . Smoking status: Current Every Day Smoker    Packs/day: 1.00    Years: 15.00    Types: Cigarettes  . Smokeless tobacco: Never Used     Comment: occ alcohol  . Alcohol use 0.0 oz/week     Comment: Rare     Allergies   Prednisone and Codeine   Review of Systems Review of Systems  All other systems reviewed and are negative.    Physical Exam Updated Vital Signs BP 119/88   Pulse 73   Temp 98.2 F (36.8 C) (Oral)   Resp 16   SpO2 98%   Physical Exam  Constitutional: She is oriented to person, place, and time.  Obese, no acute distress.  HENT:  Head: Normocephalic and atraumatic.  Eyes: Conjunctivae are normal.  Neck: Neck supple.  Cardiovascular: Normal rate and regular rhythm.   Pulmonary/Chest: Effort normal and breath sounds normal.  Abdominal: Soft. Bowel sounds are normal.  Musculoskeletal: Normal range of motion.  Neurological: She is alert and oriented to person, place, and time.  Skin: Skin is warm and dry.  Psychiatric: She has a normal mood and affect. Her behavior is normal.  Nursing note and vitals reviewed.    ED Treatments / Results  Labs (all labs ordered are listed, but only abnormal  results are displayed) Labs Reviewed  CBC  BASIC METABOLIC PANEL  I-STAT TROPOININ, ED    EKG  EKG Interpretation None       Radiology No results found.  Procedures Procedures (including critical care time)  Medications Ordered in ED Medications  ondansetron (ZOFRAN) injection 4 mg (not administered)  morphine 4 MG/ML injection 4 mg (not administered)  sodium chloride 0.9 % bolus  1,000 mL (not administered)     Initial Impression / Assessment and Plan / ED Course  I have reviewed the triage vital signs and the nursing notes.  Pertinent labs & imaging results that were available during my care of the patient were reviewed by me and considered in my medical decision making (see chart for details).     Patient is hemodynamically stable. However she has a reasonable history and risk factors. Will consult cardiology.  Final Clinical Impressions(s) / ED Diagnoses   Final diagnoses:  Chest pain, unspecified type    New Prescriptions New Prescriptions   No medications on file     Donnetta HutchingBrian Qais Jowers, MD 02/21/16 1447

## 2016-02-21 NOTE — ED Provider Notes (Signed)
5:37 PM Cardiology has seen and evaluated the patient. She has been deemed appropriate for discharge with outpatient follow-up. On my repeat evaluation the patient is in no distress, states that she feels somewhat better. I offered inpatient admission for continued monitoring of her chest pain, but she states that she is ready to go home, wants to do so. I encouraged her to monitor her condition carefully, be sure to follow-up with appropriate providers.   Gerhard Munchobert Shelli Portilla, MD 02/21/16 [phone]

## 2016-02-21 NOTE — ED Notes (Signed)
Cardiology at the bedside.

## 2016-02-21 NOTE — H&P (Signed)
Patient ID: Latoya Clark MRN: [id number], DOB/AGE: [id number]   Admit date: 02/21/2016   Primary Physician: Georgann Housekeeper, MD Primary Cardiologist: New  HPI: 56 y/o Caucasian female with a history of HTN, anxiety and depression, and GERD, sent from her gastroenterologist office to the ED today after she complained of Lt chest pain and Lt arm pain. The pt describes localized pain under her Lt breast. She says she had a similar episode in Nov 2017. She is a poor historian, this may secondary to MSO4 she received for chest pain. She states she is pain free now. Troponin negative x 2 and her EKG is normal.    Problem List: Past Medical History:  Diagnosis Date  . Active smoker    1 PPD  . Anxiety   . Arthritis   . Colitis   . Depression   . Diverticulitis   . History of IBS   . Hypertension   . Insomnia   . Mastodynia   . Sarcoid (HCC)   . Thyroid disease   . UTI (lower urinary tract infection)   . Vaginal septum     Past Surgical History:  Procedure Laterality Date  . CESAREAN SECTION  1981, 1984  . CHOLECYSTECTOMY N/A 01/05/2013   Procedure: LAPAROSCOPIC CHOLECYSTECTOMY WITH attempted INTRAOPERATIVE CHOLANGIOGRAM;  Surgeon: Atilano Ina, MD;  Location: [facility] OR;  Service: General;  Laterality: N/A;  . COPD    . HYSTEROSCOPY  02/13/07   HYSTEROSCOPY, D&C, MIRENA INSERTED  . REPLACEMENT TOTAL KNEE Right 05/12  . THYROIDECTOMY  2002  . TONSILLECTOMY       Allergies:  Allergies  Allergen Reactions  . Prednisone     Widespread pain throughout body.   . Codeine Rash    Headache.--ok with codeine if she eats.      Home Medications Prior to Admission medications   Medication Sig Start Date End Date Taking? Authorizing Provider  amoxicillin (AMOXIL) 500 MG capsule Take 2,000 mg by mouth. 1 hour prior to dental appointments.   Yes Historical Provider, MD  buPROPion (WELLBUTRIN XL) 150 MG 24 hr tablet Take 300 mg by mouth daily.    Yes Historical Provider, MD    cholecalciferol (VITAMIN D) 1000 units tablet Take 2,000 Units by mouth daily.   Yes Historical Provider, MD  clonazePAM (KLONOPIN) 1 MG tablet Take 1 mg by mouth 2 (two) times daily.    Yes Historical Provider, MD  divalproex (DEPAKOTE ER) 500 MG 24 hr tablet Take 1,000 mg by mouth at bedtime.    Yes Historical Provider, MD  escitalopram (LEXAPRO) 20 MG tablet Take 20 mg by mouth daily.   Yes Historical Provider, MD  losartan (COZAAR) 50 MG tablet Take 50 mg by mouth daily. 07/03/15  Yes Historical Provider, MD  Multiple Vitamin (MULTIVITAMIN) tablet Take 1 tablet by mouth daily.   Yes Historical Provider, MD  SYNTHROID 137 MCG tablet Take 137 mcg by mouth daily. 02/17/16  Yes Historical Provider, MD  traZODone (DESYREL) 100 MG tablet Take 100 mg by mouth at bedtime.  10/16/14  Yes Historical Provider, MD  dicyclomine (BENTYL) 10 MG capsule Take 1 capsule (10 mg total) by mouth 4 (four) times daily -  before meals and at bedtime. Patient not taking: Reported on 02/21/2016 07/26/15   Harrington Challenger, NP  orphenadrine (NORFLEX) 100 MG tablet Take 1 tablet (100 mg total) by mouth 2 (two) times daily as needed for muscle spasms. Patient not taking: Reported on 02/21/2016 09/09/15  Alexandra M Law, PA-C  pantoprazole (PROTONIX) 20 MG tablet Take 1 tablet (20 mg total) by mouth daily. Patient not taking: Reported on 02/21/2016 09/09/15   Emi Holes, PA-C     Family History  Problem Relation Age of Onset  . Depression Mother   . Anxiety disorder Mother   . Depression Father   . Cancer Father     angioplasty thyroid carcenoma  . Diabetes Father   . Hypertension Father   . Thyroid disease Father   . Bipolar disorder Sister   . Hypertension Sister   . Schizophrenia Maternal Grandfather   . Depression Maternal Grandmother   . Anxiety disorder Maternal Grandmother   . Anxiety disorder Paternal Grandfather   . Depression Paternal Grandfather   . Diabetes Paternal Grandfather   . Anxiety disorder  Paternal Grandmother   . Depression Paternal Grandmother   . Cancer Paternal Grandmother     stomach  . Diabetes Paternal Grandmother      Social History   Social History  . Marital status: Married    Spouse name: N/A  . Number of children: N/A  . Years of education: N/A   Occupational History  . Not on file.   Social History Main Topics  . Smoking status: Current Every Day Smoker    Packs/day: 1.00    Years: 15.00    Types: Cigarettes  . Smokeless tobacco: Never Used     Comment: occ alcohol  . Alcohol use 0.0 oz/week     Comment: Rare  . Drug use: No  . Sexual activity: Not Currently   Other Topics Concern  . Not on file   Social History Narrative  . No narrative on file     Review of Systems: General: negative for chills, fever, night sweats or weight changes.  Cardiovascular: negative for dyspnea on exertion, edema, orthopnea, palpitations, paroxysmal nocturnal dyspnea or shortness of breath HEENT: negative for any visual disturbances, blindness, glaucoma Dermatological: negative for rash Respiratory: negative for cough, hemoptysis, or wheezing Urologic: negative for hematuria or dysuria Abdominal: negative for nausea, vomiting, diarrhea, bright red blood per rectum, melena, or hematemesis Neurologic: negative for visual changes, syncope, or dizziness Musculoskeletal: negative for back pain, joint pain, or swelling Psych: cooperative and appropriate All other systems reviewed and are otherwise negative except as noted above.  Physical Exam: Blood pressure 138/85, pulse 78, temperature 98.2 F (36.8 C), temperature source Oral, resp. rate 17, SpO2 94 %.  General appearance: alert, cooperative, no distress and moderately obese Neck: no carotid bruit and no JVD Lungs: clear to auscultation bilaterally Heart: regular rate and rhythm Abdomen: obese, non tender Extremities: extremities normal, atraumatic, no cyanosis or edema Pulses: 2+ and symmetric Skin:  Skin color, texture, turgor normal. No rashes or lesions Neurologic: Grossly normal    Labs:   Results for orders placed or performed during the hospital encounter of 02/21/16 (from the past 24 hour(s))  Basic metabolic panel     Status: Abnormal   Collection Time: 02/21/16  2:21 PM  Result Value Ref Range   Sodium 137 135 - 145 mmol/L   Potassium 3.8 3.5 - 5.1 mmol/L   Chloride 100 (L) 101 - 111 mmol/L   CO2 27 22 - 32 mmol/L   Glucose, Bld 91 65 - 99 mg/dL   BUN 7 6 - 20 mg/dL   Creatinine, Ser 9.60 0.44 - 1.00 mg/dL   Calcium 8.8 (L) 8.9 - 10.3 mg/dL   GFR calc non Af Amer >  60 >60 mL/min   GFR calc Af Amer >60 >60 mL/min   Anion gap 10 5 - 15  CBC     Status: None   Collection Time: 02/21/16  2:21 PM  Result Value Ref Range   WBC 7.4 4.0 - 10.5 K/uL   RBC 4.14 3.87 - 5.11 MIL/uL   Hemoglobin 12.2 12.0 - 15.0 g/dL   HCT 16.1 09.6 - 04.5 %   MCV 89.6 78.0 - 100.0 fL   MCH 29.5 26.0 - 34.0 pg   MCHC 32.9 30.0 - 36.0 g/dL   RDW 40.9 81.1 - 91.4 %   Platelets 202 150 - 400 K/uL  I-stat troponin, ED     Status: None   Collection Time: 02/21/16  3:02 PM  Result Value Ref Range   Troponin i, poc 0.00 0.00 - 0.08 ng/mL   Comment 3          TSH     Status: None   Collection Time: 02/21/16  3:21 PM  Result Value Ref Range   TSH 1.947 0.350 - 4.500 uIU/mL     Radiology/Studies: Dg Chest 2 View  Result Date: 02/21/2016 CLINICAL DATA:  Intermittent left-sided chest pain for the past 3-4 weeks. Patient also reports left arm heaviness and low back pain. Current smoker. History of sarcoidosis and hypertension. EXAM: CHEST  2 VIEW COMPARISON:  PA and lateral chest x-ray of September 09, 2015 FINDINGS: The lungs are adequately inflated. There is no focal infiltrate. There is no pleural effusion. The interstitial markings are mildly prominent but have not changed since the previous study. The heart and pulmonary vascularity are normal. There is calcification in the wall of the aortic  arch and marked tortuosity of the descending thoracic aorta which is stable. There is mild multilevel degenerative disc disease of the thoracic spine. IMPRESSION: Mild chronic bronchitic-smoking related changes. Some of the findings may be related to sarcoidosis. No bulky mediastinal or hilar lymphadenopathy. Thoracic aortic atherosclerosis. Electronically Signed   By: David  [place] M.D.   On: 02/21/2016 15:19    EKG:NSR  ASSESSMENT AND PLAN:  Principal Problem:   Chest pain with moderate risk of acute coronary syndrome Active Problems:   Essential hypertension   Sarcoidosis/COPD (mild by CXR)   Current smoker-1PPD   Anxiety and depression   GERD (gastroesophageal reflux disease)    Hypothyroidism-TSH WNL 02/21/16   PLAN: Will discuss with Dr [place]. Risk factors appear to be HTN and smoking. Lipids are unknown. She may be able to be discharged and have an OP work up.    Jolene Provost, PA-C 02/21/2016, 5:07 PM [phone]  Patient seen and examined and history reviewed. Agree with above findings and plan.56 yo WF sent to ED today by her gastroenterologist for evaluation of chest pain. States she was seeing her GI physician today and complained of chest and left arm pain. She describes pain really in her left epigastrium and lateral left breast. Pain constant. Associated with left arm heaviness and numbness. Relieved with MSO4. Thinks she may have had pain in past and thinks in may be GERD. No N/V, diaphoresis, or SOB. Risk factors include smoking, HTN, mild hypercholesterolemia. No prior cardiac evaluation On exam she is obese. HEENT negative. No JVD Lungs clear.  CV RRR without gallop or murmur Abd soft with some tenderness to palpation in LUQ. BS normal No edema pulses 2+  Ecg normal  Troponin normal x 2  Impression: atypical chest pain. History really does not suggest  cardiac pain. She does have some cardiac risk factors. Evaluation in ED is unremarkable. She is stable for  DC home from a cardiac standpoint. We discussed further evaluation of her cardiac risk with outpatient stress testing. Unfortunately with chronic back and knee problems she is not able to walk on a treadmill. We discussed a pharmacologic stress test but she states the cost is prohibitive for her and she declines. At this point she should follow up with her primary care. If her chest pain persists or worsens we will need to get to the bottom of it at some point. I do strongly encourage her to quit smoking. It may be reasonable for her to try a PPI. Will follow up prn.  Dael Howland [place], MDFACC 02/21/2016 5:11 PM

## 2016-02-21 NOTE — Discharge Instructions (Signed)
As discussed, your evaluation today has been largely reassuring.  But, it is important that you monitor your condition carefully, and do not hesitate to return to the ED if you develop new, or concerning changes in your condition. ? ?Otherwise, please follow-up with your physician for appropriate ongoing care. ? ?

## 2016-03-13 ENCOUNTER — Encounter: Payer: Self-pay | Admitting: Women's Health

## 2016-03-13 ENCOUNTER — Ambulatory Visit (INDEPENDENT_AMBULATORY_CARE_PROVIDER_SITE_OTHER): Payer: 59 | Admitting: Women's Health

## 2016-03-13 VITALS — BP 122/70 | Ht 62.5 in | Wt 230.0 lb

## 2016-03-13 DIAGNOSIS — R1030 Lower abdominal pain, unspecified: Secondary | ICD-10-CM

## 2016-03-13 DIAGNOSIS — Z1151 Encounter for screening for human papillomavirus (HPV): Secondary | ICD-10-CM

## 2016-03-13 DIAGNOSIS — Z13820 Encounter for screening for osteoporosis: Secondary | ICD-10-CM | POA: Diagnosis not present

## 2016-03-13 DIAGNOSIS — Z01419 Encounter for gynecological examination (general) (routine) without abnormal findings: Secondary | ICD-10-CM

## 2016-03-13 NOTE — Progress Notes (Signed)
Latoya LeydenJulie C Ragin 06/25/1960 [id number]    History:    Presents for annual exam.  Postmenopausal on no HRT with no bleeding. Hypertension, sarcoid, hypothyroidism, anxiety/depression managed by primary care. Has had counseling. Normal Pap and mammogram history. 2011 negative colonoscopy. 02/26/2016 chest pain was evaluated at the hospital MI ruled out, reflux. Problem of loss of balance without dizziness.   Normal DEXA in 2012. Has had new onset left upper quadrant and low pelvic type discomfort.  Past medical history, past surgical history, family history and social history were all reviewed and documented in the EPIC chart. Poor relationship with husband. One daughter had a daughter, other daughter pregnant/boy.  Mental health issues in the family, mother committed suicide, sister depression, suicide attempt. Has 4 dogs.  ROS:  A ROS was performed and pertinent positives and negatives are included.  Exam:  Vitals:   03/13/16 1601  BP: 122/70  Weight: 230 lb (104.3 kg)  Height: 5' 2.5" (1.588 m)   Body mass index is 41.4 kg/m.   General appearance:  Normal Thyroid:  Symmetrical, normal in size, without palpable masses or nodularity. Respiratory  Auscultation:  Clear without wheezing or rhonchi Cardiovascular  Auscultation:  Regular rate, without rubs, murmurs or gallops  Edema/varicosities:  Not grossly evident Abdominal  Soft,nontender, without masses, guarding or rebound.  Liver/spleen:  No organomegaly noted  Hernia:  None appreciated  Skin  Inspection:  Grossly normal   Breasts: Examined lying and sitting.     Right: Without masses, retractions, discharge or axillary adenopathy.     Left: Without masses, retractions, discharge or axillary adenopathy. Gentitourinary   Inguinal/mons:  Normal without inguinal adenopathy  External genitalia:  Normal  BUS/Urethra/Skene's glands:  Normal  Vagina:  Atrophic  Cervix:  Normal   Tender with exam  Uterus:   normal in size, shape  and contour.  Midline and mobile  Adnexa/parametria:     Rt: Without masses or tenderness.   Lt: Without masses or tenderness.  Anus and perineum: Normal  Digital rectal exam: Normal sphincter tone without palpated masses or tenderness  Assessment/Plan:  56 y.o. MWF G2 P2  for annual exam.     Postmenopausal/no HRT/no bleeding No onset loss of balance sensation Hypertension/hypothyroid/depression/sarcoid-primary care managing labs and meds Left-sided pain/pelvic pain Obesity  Plan: Instructed to schedule appointment with primary care for  new-onset problem of loss of balance without dizziness. Schedule ultrasound, rule out GYN problem related to left side and pelvic discomfort. History of diverticulosis . DEXA. Reviewed importance of exercise, calcium rich diet, vitamin D 2000 daily. Home safety, fall prevention discussed. SBE's, annual screening [place] [facility] [place] [facility] not in epic. Encourage leisure activities. Pap, with HR HPV typing,  new screening guidelines reviewed.    Harrington ChallengerYOUNG,NANCY J [facility] Medical CenterWHNP, 5:03 PM 03/13/2016

## 2016-03-13 NOTE — Patient Instructions (Signed)
Health Maintenance for Postmenopausal Women Menopause is a normal process in which your reproductive ability comes to an end. This process happens gradually over a span of months to years, usually between the ages of 33 and 38. Menopause is complete when you have missed 12 consecutive menstrual periods. It is important to talk with your health care provider about some of the most common conditions that affect postmenopausal women, such as heart disease, cancer, and bone loss (osteoporosis). Adopting a healthy lifestyle and getting preventive care can help to promote your health and wellness. Those actions can also lower your chances of developing some of these common conditions. What should I know about menopause? During menopause, you may experience a number of symptoms, such as:  Moderate-to-severe hot flashes.  Night sweats.  Decrease in sex drive.  Mood swings.  Headaches.  Tiredness.  Irritability.  Memory problems.  Insomnia. Choosing to treat or not to treat menopausal changes is an individual decision that you make with your health care provider. What should I know about hormone replacement therapy and supplements? Hormone therapy products are effective for treating symptoms that are associated with menopause, such as hot flashes and night sweats. Hormone replacement carries certain risks, especially as you become older. If you are thinking about using estrogen or estrogen with progestin treatments, discuss the benefits and risks with your health care provider. What should I know about heart disease and stroke? Heart disease, heart attack, and stroke become more likely as you age. This may be due, in part, to the hormonal changes that your body experiences during menopause. These can affect how your body processes dietary fats, triglycerides, and cholesterol. Heart attack and stroke are both medical emergencies. There are many things that you can do to help prevent heart disease  and stroke:  Have your blood pressure checked at least every 1-2 years. High blood pressure causes heart disease and increases the risk of stroke.  If you are 48-61 years old, ask your health care provider if you should take aspirin to prevent a heart attack or a stroke.  Do not use any tobacco products, including cigarettes, chewing tobacco, or electronic cigarettes. If you need help quitting, ask your health care provider.  It is important to eat a healthy diet and maintain a healthy weight.  Be sure to include plenty of vegetables, fruits, low-fat dairy products, and lean protein.  Avoid eating foods that are high in solid fats, added sugars, or salt (sodium).  Get regular exercise. This is one of the most important things that you can do for your health.  Try to exercise for at least 150 minutes each week. The type of exercise that you do should increase your heart rate and make you sweat. This is known as moderate-intensity exercise.  Try to do strengthening exercises at least twice each week. Do these in addition to the moderate-intensity exercise.  Know your numbers.Ask your health care provider to check your cholesterol and your blood glucose. Continue to have your blood tested as directed by your health care provider. What should I know about cancer screening? There are several types of cancer. Take the following steps to reduce your risk and to catch any cancer development as early as possible. Breast Cancer  Practice breast self-awareness.  This means understanding how your breasts normally appear and feel.  It also means doing regular breast self-exams. Let your health care provider know about any changes, no matter how small.  If you are 40 or older,  have a clinician do a breast exam (clinical breast exam or CBE) every year. Depending on your age, family history, and medical history, it may be recommended that you also have a yearly breast X-ray (mammogram).  If you  have a family history of breast cancer, talk with your health care provider about genetic screening.  If you are at high risk for breast cancer, talk with your health care provider about having an MRI and a mammogram every year.  Breast cancer (BRCA) gene test is recommended for women who have family members with BRCA-related cancers. Results of the assessment will determine the need for genetic counseling and BRCA1 and for BRCA2 testing. BRCA-related cancers include these types:  Breast. This occurs in males or females.  Ovarian.  Tubal. This may also be called fallopian tube cancer.  Cancer of the abdominal or pelvic lining (peritoneal cancer).  Prostate.  Pancreatic. Cervical, Uterine, and Ovarian Cancer  Your health care provider may recommend that you be screened regularly for cancer of the pelvic organs. These include your ovaries, uterus, and vagina. This screening involves a pelvic exam, which includes checking for microscopic changes to the surface of your cervix (Pap test).  For women ages 21-65, health care providers may recommend a pelvic exam and a Pap test every three years. For women ages 23-65, they may recommend the Pap test and pelvic exam, combined with testing for human papilloma virus (HPV), every five years. Some types of HPV increase your risk of cervical cancer. Testing for HPV may also be done on women of any age who have unclear Pap test results.  Other health care providers may not recommend any screening for nonpregnant women who are considered low risk for pelvic cancer and have no symptoms. Ask your health care provider if a screening pelvic exam is right for you.  If you have had past treatment for cervical cancer or a condition that could lead to cancer, you need Pap tests and screening for cancer for at least 20 years after your treatment. If Pap tests have been discontinued for you, your risk factors (such as having a new sexual partner) need to be reassessed  to determine if you should start having screenings again. Some women have medical problems that increase the chance of getting cervical cancer. In these cases, your health care provider may recommend that you have screening and Pap tests more often.  If you have a family history of uterine cancer or ovarian cancer, talk with your health care provider about genetic screening.  If you have vaginal bleeding after reaching menopause, tell your health care provider.  There are currently no reliable tests available to screen for ovarian cancer. Lung Cancer  Lung cancer screening is recommended for adults 99-83 years old who are at high risk for lung cancer because of a history of smoking. A yearly low-dose CT scan of the lungs is recommended if you:  Currently smoke.  Have a history of at least 30 pack-years of smoking and you currently smoke or have quit within the past 15 years. A pack-year is smoking an average of one pack of cigarettes per day for one year. Yearly screening should:  Continue until it has been 15 years since you quit.  Stop if you develop a health problem that would prevent you from having lung cancer treatment. Colorectal Cancer  This type of cancer can be detected and can often be prevented.  Routine colorectal cancer screening usually begins at age 72 and continues  through age 75.  If you have risk factors for colon cancer, your health care provider may recommend that you be screened at an earlier age.  If you have a family history of colorectal cancer, talk with your health care provider about genetic screening.  Your health care provider may also recommend using home test kits to check for hidden blood in your stool.  A small camera at the end of a tube can be used to examine your colon directly (sigmoidoscopy or colonoscopy). This is done to check for the earliest forms of colorectal cancer.  Direct examination of the colon should be repeated every 5-10 years until  age 75. However, if early forms of precancerous polyps or small growths are found or if you have a family history or genetic risk for colorectal cancer, you may need to be screened more often. Skin Cancer  Check your skin from head to toe regularly.  Monitor any moles. Be sure to tell your health care provider:  About any new moles or changes in moles, especially if there is a change in a mole's shape or color.  If you have a mole that is larger than the size of a pencil eraser.  If any of your family members has a history of skin cancer, especially at a Milina Pagett age, talk with your health care provider about genetic screening.  Always use sunscreen. Apply sunscreen liberally and repeatedly throughout the day.  Whenever you are outside, protect yourself by wearing long sleeves, pants, a wide-brimmed hat, and sunglasses. What should I know about osteoporosis? Osteoporosis is a condition in which bone destruction happens more quickly than new bone creation. After menopause, you may be at an increased risk for osteoporosis. To help prevent osteoporosis or the bone fractures that can happen because of osteoporosis, the following is recommended:  If you are 19-50 years old, get at least 1,000 mg of calcium and at least 600 mg of vitamin D per day.  If you are older than age 50 but younger than age 70, get at least 1,200 mg of calcium and at least 600 mg of vitamin D per day.  If you are older than age 70, get at least 1,200 mg of calcium and at least 800 mg of vitamin D per day. Smoking and excessive alcohol intake increase the risk of osteoporosis. Eat foods that are rich in calcium and vitamin D, and do weight-bearing exercises several times each week as directed by your health care provider. What should I know about how menopause affects my mental health? Depression may occur at any age, but it is more common as you become older. Common symptoms of depression include:  Low or sad  mood.  Changes in sleep patterns.  Changes in appetite or eating patterns.  Feeling an overall lack of motivation or enjoyment of activities that you previously enjoyed.  Frequent crying spells. Talk with your health care provider if you think that you are experiencing depression. What should I know about immunizations? It is important that you get and maintain your immunizations. These include:  Tetanus, diphtheria, and pertussis (Tdap) booster vaccine.  Influenza every year before the flu season begins.  Pneumonia vaccine.  Shingles vaccine. Your health care provider may also recommend other immunizations. This information is not intended to replace advice given to you by your health care provider. Make sure you discuss any questions you have with your health care provider. Document Released: 02/16/2005 Document Revised: 07/15/2015 Document Reviewed: 09/28/2014 Elsevier Interactive Patient   Education  2017 [employer].  

## 2016-03-14 LAB — URINALYSIS W MICROSCOPIC + REFLEX CULTURE
BILIRUBIN URINE: NEGATIVE
Bacteria, UA: NONE SEEN [HPF]
CASTS: NONE SEEN [LPF]
Crystals: NONE SEEN [HPF]
GLUCOSE, UA: NEGATIVE
HGB URINE DIPSTICK: NEGATIVE
KETONES UR: NEGATIVE
LEUKOCYTES UA: NEGATIVE
NITRITE: NEGATIVE
PH: 7.5 (ref 5.0–8.0)
Protein, ur: NEGATIVE
Specific Gravity, Urine: 1.013 (ref 1.001–1.035)
WBC UA: NONE SEEN WBC/HPF (ref <=5)
Yeast: NONE SEEN [HPF]

## 2016-03-14 NOTE — Addendum Note (Signed)
Addended by: Berna SpareASTILLO, BLANCA A on: 03/14/2016 08:30 AM   Modules accepted: Orders

## 2016-03-15 LAB — URINE CULTURE

## 2016-03-16 ENCOUNTER — Telehealth: Payer: Self-pay | Admitting: *Deleted

## 2016-03-16 LAB — PAP, TP IMAGING W/ HPV RNA, RFLX HPV TYPE 16,18/45: HPV MRNA, HIGH RISK: NOT DETECTED

## 2016-03-16 NOTE — Telephone Encounter (Signed)
Please call and review urine was negative and negative urine culture. We could do a GYN ultrasound to make sure not GYN related if she would like.

## 2016-03-16 NOTE — Telephone Encounter (Signed)
Left message for pt to call.

## 2016-03-16 NOTE — Telephone Encounter (Signed)
Pt asked me to relay to you that [facility] and said the left side pain was more likely coming from her kidneys per patient. Pt said the doctor told her to follow up with PCP.

## 2016-03-21 ENCOUNTER — Other Ambulatory Visit: Payer: Self-pay | Admitting: Internal Medicine

## 2016-03-21 DIAGNOSIS — R109 Unspecified abdominal pain: Secondary | ICD-10-CM

## 2016-03-23 ENCOUNTER — Ambulatory Visit
Admission: RE | Admit: 2016-03-23 | Discharge: 2016-03-23 | Disposition: A | Discharge status: Home / Self Care | Payer: 59 | Source: Ambulatory Visit | Attending: Internal Medicine | Admitting: Internal Medicine

## 2016-03-23 DIAGNOSIS — R109 Unspecified abdominal pain: Secondary | ICD-10-CM

## 2016-03-23 NOTE — Telephone Encounter (Signed)
Pt informed ultrasound scheduled on 03/28/16

## 2016-03-28 ENCOUNTER — Ambulatory Visit (INDEPENDENT_AMBULATORY_CARE_PROVIDER_SITE_OTHER): Payer: 59

## 2016-03-28 ENCOUNTER — Ambulatory Visit (INDEPENDENT_AMBULATORY_CARE_PROVIDER_SITE_OTHER): Payer: 59 | Admitting: Women's Health

## 2016-03-28 DIAGNOSIS — R1030 Lower abdominal pain, unspecified: Secondary | ICD-10-CM

## 2016-03-28 NOTE — Progress Notes (Signed)
Presents for ultrasound. At annual exam had complaint of low abdominal pressure, bloating, and generalized discomfort in pelvis. No abnormality noted on exam. Postmenopausal on no HRT with no bleeding. History of anxiety/depression fibromyalgia, stress incontinence, IBS, GERD, back pain, and is a light smoker. 2011 negative colonoscopy.  Exam: Appears well. Ultrasound: T/V uterus anteverted homogeneous. Endometrium within normal limits 2.5 mm. Right and left ovary normal. Negative cul-de-sac. No apparent mass in right or left adnexal.  Normal GYN ultrasound Low abdominal discomfort  Plan: Reviewed normality of ultrasound. Reviewed may possibly be coming from IBS, fibromyalgia. Encouraged to follow-up with GI who had originally thought it was more GYN.

## 2016-03-28 NOTE — Patient Instructions (Signed)
Myofascial Pain Syndrome and Fibromyalgia Myofascial pain syndrome and fibromyalgia are both pain disorders. This pain may be felt mainly in your muscles.  Myofascial pain syndrome:  Always has trigger points or tender points in the muscle that will cause pain when pressed. The pain may come and go.  Usually affects your neck, upper back, and shoulder areas. The pain often radiates into your arms and hands.  Fibromyalgia:  Has muscle pains and tenderness that come and go.  Is often associated with fatigue and sleep disturbances.  Has trigger points.  Tends to be long-lasting (chronic), but is not life-threatening. Fibromyalgia and myofascial pain are not the same. However, they often occur together. If you have both conditions, each can make the other worse. Both are common and can cause enough pain and fatigue to make day-to-day activities difficult. What are the causes? The exact causes of fibromyalgia and myofascial pain are not known. People with certain gene types may be more likely to develop fibromyalgia. Some factors can be triggers for both conditions, such as:  Spine disorders.  Arthritis.  Severe injury (trauma) and other physical stressors.  Being under a lot of stress.  A medical illness. What are the signs or symptoms? Fibromyalgia  The main symptom of fibromyalgia is widespread pain and tenderness in your muscles. This can vary over time. Pain is sometimes described as stabbing, shooting, or burning. You may have tingling or numbness, too. You may also have sleep problems and fatigue. You may wake up feeling tired and groggy (fibro fog). Other symptoms may include:  Bowel and bladder problems.  Headaches.  Visual problems.  Problems with odors and noises.  Depression or mood changes.  Painful menstrual periods (dysmenorrhea).  Dry skin or eyes. Myofascial pain syndrome  Symptoms of myofascial pain syndrome include:  Tight, ropy bands of  muscle.  Uncomfortable sensations in muscular areas, such as:  Aching.  Cramping.  Burning.  Numbness.  Tingling.  Muscle weakness.  Trouble moving certain muscles freely (range of motion). How is this diagnosed? There are no specific tests to diagnose fibromyalgia or myofascial pain syndrome. Both can be hard to diagnose because their symptoms are common in many other conditions. Your health care provider may suspect one or both of these conditions based on your symptoms and medical history. Your health care provider will also do a physical exam. The key to diagnosing fibromyalgia is having pain, fatigue, and other symptoms for more than three months that cannot be explained by another condition. The key to diagnosing myofascial pain syndrome is finding trigger points in muscles that are tender and cause pain elsewhere in your body (referred pain). How is this treated? Treating fibromyalgia and myofascial pain often requires a team of health care providers. This usually starts with your primary provider and a physical therapist. You may also find it helpful to work with alternative health care providers, such as massage therapists or acupuncturists. Treatment for fibromyalgia may include medicines. This may include nonsteroidal anti-inflammatory drugs (NSAIDs), along with other medicines. Treatment for myofascial pain may also include:  NSAIDs.  Cooling and stretching of muscles.  Trigger point injections.  Sound wave (ultrasound) treatments to stimulate muscles. Follow these instructions at home:  Take medicines only as directed by your health care provider.  Exercise as directed by your health care provider or physical therapist.  Try to avoid stressful situations.  Practice relaxation techniques to control your stress. You may want to try:  Biofeedback.  Visual imagery.  Hypnosis.    Muscle relaxation.  Yoga.  Meditation.  Talk to your health care provider  about alternative treatments, such as acupuncture or massage treatment.  Maintain a healthy lifestyle. This includes eating a healthy diet and getting enough sleep.  Consider joining a support group.  Do not do activities that stress or strain your muscles. That includes repetitive motions and heavy lifting. Where to find more information:  National Fibromyalgia Association: www.fmaware.org  Arthritis Foundation: www.arthritis.org  American Chronic Pain Association: www.theacpa.org/condition/myofascial-pain Contact a health care provider if:  You have new symptoms.  Your symptoms get worse.  You have side effects from your medicines.  You have trouble sleeping.  Your condition is causing depression or anxiety. This information is not intended to replace advice given to you by your health care provider. Make sure you discuss any questions you have with your health care provider. Document Released: 12/25/2004 Document Revised: 06/02/2015 Document Reviewed: 09/30/2013 Elsevier Interactive Patient Education  2017 Elsevier Inc.  

## 2016-04-11 ENCOUNTER — Other Ambulatory Visit: Payer: Self-pay | Admitting: Gynecology

## 2016-04-11 DIAGNOSIS — Z13820 Encounter for screening for osteoporosis: Secondary | ICD-10-CM

## 2016-04-16 ENCOUNTER — Encounter: Payer: Self-pay | Admitting: Women's Health

## 2016-05-23 ENCOUNTER — Encounter: Payer: Self-pay | Admitting: Gynecology

## 2016-08-27 ENCOUNTER — Other Ambulatory Visit: Payer: Self-pay | Admitting: Internal Medicine

## 2016-08-27 ENCOUNTER — Ambulatory Visit
Admission: RE | Admit: 2016-08-27 | Discharge: 2016-08-27 | Disposition: A | Discharge status: Home / Self Care | Payer: 59 | Source: Ambulatory Visit | Attending: Internal Medicine | Admitting: Internal Medicine

## 2016-08-27 DIAGNOSIS — M79642 Pain in left hand: Principal | ICD-10-CM

## 2016-08-27 DIAGNOSIS — M79641 Pain in right hand: Secondary | ICD-10-CM

## 2017-03-08 DIAGNOSIS — M8580 Other specified disorders of bone density and structure, unspecified site: Secondary | ICD-10-CM

## 2017-03-08 HISTORY — DX: Other specified disorders of bone density and structure, unspecified site: M85.80

## 2017-03-19 ENCOUNTER — Encounter: Payer: Self-pay | Admitting: Women's Health

## 2017-03-19 ENCOUNTER — Ambulatory Visit: Payer: 59 | Admitting: Women's Health

## 2017-03-19 VITALS — Ht 62.0 in | Wt 218.0 lb

## 2017-03-19 DIAGNOSIS — Z13820 Encounter for screening for osteoporosis: Secondary | ICD-10-CM

## 2017-03-19 DIAGNOSIS — Z01419 Encounter for gynecological examination (general) (routine) without abnormal findings: Secondary | ICD-10-CM | POA: Diagnosis not present

## 2017-03-19 NOTE — Patient Instructions (Signed)
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
## 2017-03-21 ENCOUNTER — Other Ambulatory Visit: Payer: Self-pay | Admitting: Internal Medicine

## 2017-03-21 ENCOUNTER — Ambulatory Visit
Admission: RE | Admit: 2017-03-21 | Discharge: 2017-03-21 | Disposition: A | Discharge status: Home / Self Care | Payer: 59 | Source: Ambulatory Visit | Attending: Internal Medicine | Admitting: Internal Medicine

## 2017-03-21 DIAGNOSIS — F17210 Nicotine dependence, cigarettes, uncomplicated: Secondary | ICD-10-CM

## 2017-03-26 ENCOUNTER — Other Ambulatory Visit: Payer: Self-pay | Admitting: Gynecology

## 2017-03-26 ENCOUNTER — Encounter: Payer: Self-pay | Admitting: Gynecology

## 2017-03-26 ENCOUNTER — Ambulatory Visit: Payer: 59

## 2017-03-26 DIAGNOSIS — M8588 Other specified disorders of bone density and structure, other site: Secondary | ICD-10-CM

## 2017-03-26 DIAGNOSIS — Z13820 Encounter for screening for osteoporosis: Secondary | ICD-10-CM

## 2017-03-28 ENCOUNTER — Encounter: Payer: Self-pay | Admitting: Podiatry

## 2017-03-28 ENCOUNTER — Ambulatory Visit: Payer: 59 | Admitting: Podiatry

## 2017-03-28 DIAGNOSIS — L600 Ingrowing nail: Secondary | ICD-10-CM

## 2017-03-28 NOTE — Patient Instructions (Signed)

## 2017-04-11 NOTE — Progress Notes (Signed)
Subjective:   Patient ID: Latoya LeydenJulie C Clark, female   DOB: 57 y.o.   MRN: [id number]   HPI Patient presents with ingrown toenail deformity left hallux with thickness of the nail bed and pain in the corner.  Patient states she is tried to trim it and soak it herself and patient has good digital perfusion and does not smoke and likes to be active   Review of Systems  All other systems reviewed and are negative.       Objective:  Physical Exam  Constitutional: She appears well-developed and well-nourished.  Cardiovascular: Intact distal pulses.  Pulmonary/Chest: Effort normal.  Musculoskeletal: Normal range of motion.  Neurological: She is alert.  Skin: Skin is warm.  Nursing note and vitals reviewed.   Neurovascular status intact muscle strength adequate range of motion within normal limits with patient noted to have an incurvated nail bed left hallux that is localized in nature with no distal redness or active drainage noted.  Patient has good digital perfusion is well oriented x3     Assessment:  Ingrown toenail deformity with irritation of the border left hallux     Plan:  H&P condition reviewed and at this point recommended removal of the corner explaining procedure risk and allowed her to read consent form signing the consent form.  I infiltrated the hallux 60 mg like Marcaine mixture under sterile condition remove the corner exposed matrix by phenol 3 applications 30 seconds followed by alcohol lavage sterile dressing and did this all under sterile technique with sterile instrumentation.  I then instructed on soaks and reappoint and encouraged her to call with any questions or concerns she may have

## 2017-04-26 ENCOUNTER — Inpatient Hospital Stay (HOSPITAL_COMMUNITY): Payer: 59

## 2017-04-26 ENCOUNTER — Inpatient Hospital Stay (HOSPITAL_COMMUNITY)
Admission: AD | Admit: 2017-04-26 | Discharge: 2017-04-29 | DRG: 392 | Disposition: A | Discharge status: Home / Self Care | Payer: 59 | Source: Ambulatory Visit | Attending: Internal Medicine | Admitting: Internal Medicine

## 2017-04-26 ENCOUNTER — Encounter (HOSPITAL_COMMUNITY): Payer: Self-pay | Admitting: *Deleted

## 2017-04-26 DIAGNOSIS — F419 Anxiety disorder, unspecified: Secondary | ICD-10-CM | POA: Diagnosis present

## 2017-04-26 DIAGNOSIS — R0789 Other chest pain: Secondary | ICD-10-CM

## 2017-04-26 DIAGNOSIS — F329 Major depressive disorder, single episode, unspecified: Secondary | ICD-10-CM | POA: Diagnosis present

## 2017-04-26 DIAGNOSIS — E890 Postprocedural hypothyroidism: Secondary | ICD-10-CM | POA: Diagnosis present

## 2017-04-26 DIAGNOSIS — N179 Acute kidney failure, unspecified: Secondary | ICD-10-CM | POA: Diagnosis present

## 2017-04-26 DIAGNOSIS — Z818 Family history of other mental and behavioral disorders: Secondary | ICD-10-CM | POA: Diagnosis not present

## 2017-04-26 DIAGNOSIS — M8580 Other specified disorders of bone density and structure, unspecified site: Secondary | ICD-10-CM | POA: Diagnosis present

## 2017-04-26 DIAGNOSIS — Z800 Family history of malignant neoplasm of digestive organs: Secondary | ICD-10-CM | POA: Diagnosis not present

## 2017-04-26 DIAGNOSIS — Z96651 Presence of right artificial knee joint: Secondary | ICD-10-CM | POA: Diagnosis present

## 2017-04-26 DIAGNOSIS — Z888 Allergy status to other drugs, medicaments and biological substances status: Secondary | ICD-10-CM

## 2017-04-26 DIAGNOSIS — Z8249 Family history of ischemic heart disease and other diseases of the circulatory system: Secondary | ICD-10-CM | POA: Diagnosis not present

## 2017-04-26 DIAGNOSIS — K5732 Diverticulitis of large intestine without perforation or abscess without bleeding: Secondary | ICD-10-CM | POA: Diagnosis not present

## 2017-04-26 DIAGNOSIS — R0781 Pleurodynia: Secondary | ICD-10-CM | POA: Diagnosis present

## 2017-04-26 DIAGNOSIS — Z8349 Family history of other endocrine, nutritional and metabolic diseases: Secondary | ICD-10-CM

## 2017-04-26 DIAGNOSIS — Z87440 Personal history of urinary (tract) infections: Secondary | ICD-10-CM | POA: Diagnosis not present

## 2017-04-26 DIAGNOSIS — E669 Obesity, unspecified: Secondary | ICD-10-CM | POA: Diagnosis present

## 2017-04-26 DIAGNOSIS — I700 Atherosclerosis of aorta: Secondary | ICD-10-CM | POA: Diagnosis present

## 2017-04-26 DIAGNOSIS — Z9049 Acquired absence of other specified parts of digestive tract: Secondary | ICD-10-CM | POA: Diagnosis not present

## 2017-04-26 DIAGNOSIS — F17210 Nicotine dependence, cigarettes, uncomplicated: Secondary | ICD-10-CM | POA: Diagnosis present

## 2017-04-26 DIAGNOSIS — R079 Chest pain, unspecified: Secondary | ICD-10-CM

## 2017-04-26 DIAGNOSIS — Z885 Allergy status to narcotic agent status: Secondary | ICD-10-CM | POA: Diagnosis not present

## 2017-04-26 DIAGNOSIS — F17200 Nicotine dependence, unspecified, uncomplicated: Secondary | ICD-10-CM | POA: Diagnosis present

## 2017-04-26 DIAGNOSIS — I10 Essential (primary) hypertension: Secondary | ICD-10-CM | POA: Diagnosis present

## 2017-04-26 DIAGNOSIS — J9811 Atelectasis: Secondary | ICD-10-CM | POA: Diagnosis present

## 2017-04-26 DIAGNOSIS — R1032 Left lower quadrant pain: Secondary | ICD-10-CM

## 2017-04-26 DIAGNOSIS — K589 Irritable bowel syndrome without diarrhea: Secondary | ICD-10-CM | POA: Diagnosis present

## 2017-04-26 DIAGNOSIS — F32A Depression, unspecified: Secondary | ICD-10-CM | POA: Diagnosis present

## 2017-04-26 DIAGNOSIS — M1990 Unspecified osteoarthritis, unspecified site: Secondary | ICD-10-CM | POA: Diagnosis present

## 2017-04-26 LAB — URINALYSIS, ROUTINE W REFLEX MICROSCOPIC
Bilirubin Urine: NEGATIVE
GLUCOSE, UA: NEGATIVE mg/dL
Hgb urine dipstick: NEGATIVE
Ketones, ur: 20 mg/dL — AB
Nitrite: NEGATIVE
PROTEIN: NEGATIVE mg/dL
Specific Gravity, Urine: 1.019 (ref 1.005–1.030)
pH: 7 (ref 5.0–8.0)

## 2017-04-26 LAB — COMPREHENSIVE METABOLIC PANEL
ALBUMIN: 3.7 g/dL (ref 3.5–5.0)
ALK PHOS: 47 U/L (ref 38–126)
ALT: 16 U/L (ref 14–54)
AST: 20 U/L (ref 15–41)
Anion gap: 10 (ref 5–15)
BILIRUBIN TOTAL: 0.9 mg/dL (ref 0.3–1.2)
BUN: 10 mg/dL (ref 6–20)
CALCIUM: 8.6 mg/dL — AB (ref 8.9–10.3)
CO2: 27 mmol/L (ref 22–32)
CREATININE: 0.69 mg/dL (ref 0.44–1.00)
Chloride: 99 mmol/L — ABNORMAL LOW (ref 101–111)
GFR calc Af Amer: >60 mL/min (ref >60)
GFR calc non Af Amer: >60 mL/min (ref >60)
GLUCOSE: 93 mg/dL (ref 65–99)
Potassium: 3.7 mmol/L (ref 3.5–5.1)
Sodium: 136 mmol/L (ref 135–145)
TOTAL PROTEIN: 7.4 g/dL (ref 6.5–8.1)

## 2017-04-26 LAB — CBC
HEMATOCRIT: 40.7 % (ref 36.0–46.0)
HEMOGLOBIN: 13.9 g/dL (ref 12.0–15.0)
MCH: 30.4 pg (ref 26.0–34.0)
MCHC: 34.2 g/dL (ref 30.0–36.0)
MCV: 89.1 fL (ref 78.0–100.0)
Platelets: 206 10*3/uL (ref 150–400)
RBC: 4.57 MIL/uL (ref 3.87–5.11)
RDW: 13.1 % (ref 11.5–15.5)
WBC: 11.4 10*3/uL — AB (ref 4.0–10.5)

## 2017-04-26 LAB — CK TOTAL AND CKMB (NOT AT ARMC)
CK, MB: 1.9 ng/mL (ref 0.5–5.0)
Relative Index: 1 (ref 0.0–2.5)
Total CK: 182 U/L (ref 38–234)

## 2017-04-26 LAB — TROPONIN I: Troponin I: <0.03 ng/mL (ref <0.03)

## 2017-04-26 LAB — PROCALCITONIN: Procalcitonin: <0.1 ng/mL

## 2017-04-26 MED ORDER — OXYCODONE HCL 5 MG PO TABS
5.0000 mg | ORAL_TABLET | ORAL | Status: DC | PRN
  Administered 2017-04-27 (×2): 5 mg via ORAL
  Filled 2017-04-26 (×2): qty 1

## 2017-04-26 MED ORDER — SODIUM CHLORIDE 0.9% FLUSH: 3.0000 mL | INTRAVENOUS | Status: DC | PRN

## 2017-04-26 MED ORDER — CIPROFLOXACIN IN D5W 400 MG/200ML IV SOLN
400.0000 mg | Freq: Once | INTRAVENOUS | Status: AC
  Administered 2017-04-26: 400 mg via INTRAVENOUS
  Filled 2017-04-26: qty 200

## 2017-04-26 MED ORDER — METRONIDAZOLE IN NACL 5-0.79 MG/ML-% IV SOLN
500.0000 mg | Freq: Once | INTRAVENOUS | Status: AC
  Administered 2017-04-27: 500 mg via INTRAVENOUS
  Filled 2017-04-26: qty 100

## 2017-04-26 MED ORDER — ONDANSETRON HCL 4 MG/2ML IJ SOLN
4.0000 mg | Freq: Once | INTRAMUSCULAR | Status: AC
  Administered 2017-04-26: 4 mg via INTRAVENOUS
  Filled 2017-04-26: qty 2

## 2017-04-26 MED ORDER — ACETAMINOPHEN 325 MG PO TABS
650.0000 mg | ORAL_TABLET | Freq: Four times a day (QID) | ORAL | Status: DC | PRN
  Administered 2017-04-27: 650 mg via ORAL
  Filled 2017-04-26: qty 2

## 2017-04-26 MED ORDER — ONDANSETRON HCL 4 MG PO TABS
4.0000 mg | ORAL_TABLET | Freq: Four times a day (QID) | ORAL | Status: DC | PRN
  Administered 2017-04-29: 4 mg via ORAL
  Filled 2017-04-26: qty 1

## 2017-04-26 MED ORDER — DIVALPROEX SODIUM ER 500 MG PO TB24
1000.0000 mg | ORAL_TABLET | Freq: Every day | ORAL | Status: DC
  Administered 2017-04-27 – 2017-04-28 (×3): 1000 mg via ORAL
  Filled 2017-04-26 (×3): qty 2

## 2017-04-26 MED ORDER — IOPAMIDOL (ISOVUE-370) INJECTION 76%
100.0000 mL | Freq: Once | INTRAVENOUS | Status: AC | PRN
  Administered 2017-04-26: 100 mL via INTRAVENOUS

## 2017-04-26 MED ORDER — VITAMIN D 1000 UNITS PO TABS
1000.0000 [IU] | ORAL_TABLET | Freq: Every day | ORAL | Status: DC
  Administered 2017-04-27 – 2017-04-29 (×3): 1000 [IU] via ORAL
  Filled 2017-04-26 (×3): qty 1

## 2017-04-26 MED ORDER — TRAZODONE HCL 50 MG PO TABS: 50.0000 mg | ORAL_TABLET | Freq: Every evening | ORAL | Status: DC | PRN

## 2017-04-26 MED ORDER — POLYETHYLENE GLYCOL 3350 17 G PO PACK
17.0000 g | PACK | Freq: Every day | ORAL | Status: DC | PRN
Start: 2017-04-26 — End: 2017-04-29

## 2017-04-26 MED ORDER — ESCITALOPRAM OXALATE 20 MG PO TABS
20.0000 mg | ORAL_TABLET | Freq: Every day | ORAL | Status: DC
  Administered 2017-04-27 – 2017-04-29 (×3): 20 mg via ORAL
  Filled 2017-04-26 (×3): qty 1

## 2017-04-26 MED ORDER — HYDROMORPHONE HCL 2 MG/ML IJ SOLN
0.5000 mg | INTRAMUSCULAR | Status: DC | PRN
  Administered 2017-04-27 (×2): 0.5 mg via INTRAVENOUS
  Filled 2017-04-26 (×2): qty 1

## 2017-04-26 MED ORDER — GI COCKTAIL ~~LOC~~
30.0000 mL | Freq: Once | ORAL | Status: AC
  Administered 2017-04-26: 30 mL via ORAL
  Filled 2017-04-26: qty 30

## 2017-04-26 MED ORDER — DEXTROSE-NACL 5-0.45 % IV SOLN
INTRAVENOUS | Status: DC
  Administered 2017-04-27 – 2017-04-28 (×2): via INTRAVENOUS

## 2017-04-26 MED ORDER — ASPIRIN 325 MG PO TABS
325.0000 mg | ORAL_TABLET | Freq: Once | ORAL | Status: DC
  Filled 2017-04-26: qty 1

## 2017-04-26 MED ORDER — SODIUM CHLORIDE 0.9 % IV SOLN: 250.0000 mL | INTRAVENOUS | Status: DC | PRN

## 2017-04-26 MED ORDER — BUPROPION HCL ER (XL) 150 MG PO TB24
450.0000 mg | ORAL_TABLET | Freq: Every day | ORAL | Status: DC
  Administered 2017-04-27 – 2017-04-29 (×3): 450 mg via ORAL
  Filled 2017-04-26 (×3): qty 3

## 2017-04-26 MED ORDER — SODIUM CHLORIDE 0.9% FLUSH
3.0000 mL | Freq: Two times a day (BID) | INTRAVENOUS | Status: DC
  Administered 2017-04-27 – 2017-04-29 (×5): 3 mL via INTRAVENOUS

## 2017-04-26 MED ORDER — LOSARTAN POTASSIUM 50 MG PO TABS: 50.0000 mg | ORAL_TABLET | Freq: Every day | ORAL | Status: DC

## 2017-04-26 MED ORDER — ASPIRIN 81 MG PO CHEW
CHEWABLE_TABLET | ORAL | Status: AC
  Administered 2017-04-26: 324 mg via ORAL
  Filled 2017-04-26: qty 4

## 2017-04-26 MED ORDER — SODIUM CHLORIDE 0.9 % IV SOLN
INTRAVENOUS | Status: DC
  Administered 2017-04-26: 19:00:00 via INTRAVENOUS

## 2017-04-26 MED ORDER — HEPARIN SODIUM (PORCINE) 5000 UNIT/ML IJ SOLN
5000.0000 [IU] | Freq: Three times a day (TID) | INTRAMUSCULAR | Status: DC
  Administered 2017-04-27 – 2017-04-29 (×7): 5000 [IU] via SUBCUTANEOUS
  Filled 2017-04-26 (×7): qty 1

## 2017-04-26 MED ORDER — SENNA 8.6 MG PO TABS
1.0000 | ORAL_TABLET | Freq: Two times a day (BID) | ORAL | Status: DC
  Administered 2017-04-27 – 2017-04-28 (×3): 8.6 mg via ORAL
  Filled 2017-04-26 (×4): qty 1

## 2017-04-26 MED ORDER — FENTANYL CITRATE (PF) 100 MCG/2ML IJ SOLN
100.0000 ug | Freq: Once | INTRAMUSCULAR | Status: AC
  Administered 2017-04-26: 100 ug via INTRAVENOUS
  Filled 2017-04-26: qty 2

## 2017-04-26 MED ORDER — ASPIRIN 81 MG PO CHEW
324.0000 mg | CHEWABLE_TABLET | Freq: Once | ORAL | Status: AC
  Administered 2017-04-26: 324 mg via ORAL

## 2017-04-26 MED ORDER — FAMOTIDINE 20 MG PO TABS
20.0000 mg | ORAL_TABLET | Freq: Two times a day (BID) | ORAL | Status: DC
  Administered 2017-04-27 – 2017-04-29 (×6): 20 mg via ORAL
  Filled 2017-04-26 (×6): qty 1

## 2017-04-26 MED ORDER — ACETAMINOPHEN 650 MG RE SUPP: 650.0000 mg | Freq: Four times a day (QID) | RECTAL | Status: DC | PRN

## 2017-04-26 MED ORDER — LEVOTHYROXINE SODIUM 137 MCG PO TABS
137.0000 ug | ORAL_TABLET | Freq: Every day | ORAL | Status: DC
  Administered 2017-04-27 – 2017-04-29 (×3): 137 ug via ORAL
  Filled 2017-04-26 (×3): qty 1

## 2017-04-26 MED ORDER — ALBUTEROL SULFATE (2.5 MG/3ML) 0.083% IN NEBU: 2.5000 mg | INHALATION_SOLUTION | RESPIRATORY_TRACT | Status: DC | PRN

## 2017-04-26 MED ORDER — VITAMIN E 180 MG (400 UNIT) PO CAPS
1200.0000 [IU] | ORAL_CAPSULE | Freq: Every day | ORAL | Status: DC
  Administered 2017-04-27 – 2017-04-29 (×3): 1200 [IU] via ORAL
  Filled 2017-04-26 (×3): qty 3

## 2017-04-26 MED ORDER — OMEGA-3-ACID ETHYL ESTERS 1 G PO CAPS
1.0000 g | ORAL_CAPSULE | Freq: Two times a day (BID) | ORAL | Status: DC
  Administered 2017-04-27 – 2017-04-29 (×5): 1 g via ORAL
  Filled 2017-04-26 (×5): qty 1

## 2017-04-26 MED ORDER — CLONAZEPAM 1 MG PO TABS
1.0000 mg | ORAL_TABLET | Freq: Two times a day (BID) | ORAL | Status: DC
  Administered 2017-04-27 – 2017-04-29 (×6): 1 mg via ORAL
  Filled 2017-04-26 (×3): qty 1
  Filled 2017-04-26: qty 2
  Filled 2017-04-26 (×2): qty 1

## 2017-04-26 MED ORDER — FENTANYL CITRATE (PF) 100 MCG/2ML IJ SOLN
50.0000 ug | Freq: Once | INTRAMUSCULAR | Status: AC
  Administered 2017-04-26: 50 ug via INTRAVENOUS
  Filled 2017-04-26: qty 2

## 2017-04-26 MED ORDER — TRAZODONE HCL 100 MG PO TABS
100.0000 mg | ORAL_TABLET | Freq: Every day | ORAL | Status: DC
  Administered 2017-04-27 – 2017-04-28 (×3): 100 mg via ORAL
  Filled 2017-04-26 (×2): qty 1
  Filled 2017-04-26: qty 2

## 2017-04-26 MED ORDER — IOPAMIDOL (ISOVUE-370) INJECTION 76%
INTRAVENOUS | Status: AC
  Filled 2017-04-26: qty 100

## 2017-04-26 MED ORDER — ONDANSETRON HCL 4 MG/2ML IJ SOLN: 4.0000 mg | Freq: Four times a day (QID) | INTRAMUSCULAR | Status: DC | PRN

## 2017-04-26 MED ORDER — ADULT MULTIVITAMIN W/MINERALS CH
1.0000 | ORAL_TABLET | Freq: Every day | ORAL | Status: DC
  Administered 2017-04-27 – 2017-04-28 (×3): 1 via ORAL
  Filled 2017-04-26 (×3): qty 1

## 2017-04-26 NOTE — H&P (Signed)
Patient Demographics:    Latoya Clark, is a 57 y.o. female  MRN: [id number]   DOB - September 16, 1960  Admit Date - 04/26/2017  Outpatient Primary MD for the patient is Georgann Housekeeper, MD   Assessment & Plan:    Principal Problem:   Diverticulitis large intestine Active Problems:   Essential hypertension   Current smoker   Anxiety and depression   AKI (acute kidney injury) (HCC)  CT Abd/pelvis IMPRESSION:  Chest Impression:  1. No evidence of aortic dissection or aneurysm. 2. Mild basilar atelectasis.  Abdomen/Pelvis Impression:  1. ACUTE DIVERTICULITIS of the proximal sigmoid colon. No abscess or macro perforation. 2. Intimal calcification of the abdominal aorta without evidence of dissection or aneurysm.    Plan:-  1)Acute Proximal Sigmoid Diverticulitis-treat empirically with IV Zosyn, n.p.o. for now except for meds, use as needed Zofran and as needed IV Dilaudid or oral Oxy IR.  Leukocytosis noted (11.4), however patient does not meet sepsis criteria.  Patient tells me she has a history of recurrent diverticulitis, has had multiple colonoscopies in the past  2)Atypical Chest Pain-despite multiple cardiovascular risk factors, patient's chest pain is very atypical for angina....  Her cardiovascular risk factors include tobacco use, hypertension, status post menopause, obesity and sedentary lifestyle, , on  telemetry monitored unit, check serial troponins and EKG to rule out acute coronary syndrome .  If patient rules out for ACS, will need further cardiovascular risk stratification .  Consider cardiology consult to help decide if Stress test is needed in am Versus other diagnostic modalities.   Give aspirin, prn nitroglycerin, and betablocker.   3)Depression/Anxiety--- affect is very anxious, continue  bupropion 450 mg daily, clonazepam 1 mg twice daily, Lexapro 20 mg daily, Depakote 1000mg  qhs,  and trazodone 100 mg nightly  4)HTN-  Hold Losartan 50 mg daily due to developing AK I, use metoprolol 12.5 mg twice daily,  may use IV Hydralazine 10 mg  Every 4 hours Prn for systolic blood pressure over 160 mmhg  5)Tobacco Abuse- Smoking cessation counseling for 4 minutes today, consider nicotine patch once chest pain issues resolves  6)Hypothyroidism-stable, continue levothyroxine 137 mcg daily  7)Aki- creatinine is now 1.46 from  0.7 a few hrs ago, pt had CTA chest/abd/pelvis on 04/26/17, will hold Losartan, hydrate judiciously, ???  Component of contrast-induced nephropathy in the setting of dehydration, avoid nephrotoxic agents, avoid hypotension, consider renal consult if renal function does not improve with hydration.  I have ordered saline bolus for patient followed by maintenance fluids  8)Possible UTI -UA suspicious for UTI, IV Zosyn as ordered pending cultures   With History of - Reviewed by me  Past Medical History:  Diagnosis Date  . Active smoker    1 PPD  . Anxiety   . Arthritis   . Colitis   . Depression   . Diverticulitis   . History of IBS   . Hypertension   . Insomnia   .  Mastodynia   . Osteopenia 03/2017   T score -1.6 FRAX 5.2%/0.2% stable from prior DEXA  . Sarcoid   . Thyroid disease   . UTI (lower urinary tract infection)   . Vaginal septum       Past Surgical History:  Procedure Laterality Date  . CESAREAN SECTION  1981, 1984  . CHOLECYSTECTOMY N/A 01/05/2013   Procedure: LAPAROSCOPIC CHOLECYSTECTOMY WITH attempted INTRAOPERATIVE CHOLANGIOGRAM;  Surgeon: Atilano Ina, MD;  Location: [facility] OR;  Service: General;  Laterality: N/A;  . COPD    . HYSTEROSCOPY  02/13/07   HYSTEROSCOPY, D&C, MIRENA INSERTED  . REPLACEMENT TOTAL KNEE Right 05/12  . THYROIDECTOMY  2002  . TONSILLECTOMY        Chief Complaint  Patient presents with  . Abdominal Pain  .  Chest Pain  . Arm Pain      HPI:    Latoya Clark  is a 57 y.o. female with past medical history relevant for hypothyroidism, hypertension, obesity, as well as history of IBS and recurrent diverticulitis who presents with complaints of left lower quadrant for about 3 days now today she developed epigastric area discomfort. Patient tells me she has a history of recurrent diverticulitis, has had multiple colonoscopies in the past.  She denies dysuria  Patient initially went to [facility] as her pain was pretty low down  in her abdomen, while at [facility] she complained of left lower quadrant pain as well as epigastric/chest discomfort, EKG was done she was sent over here for further evaluation.  Chest discomfort was associated with nausea but no palpitations no leg pains no pleuritic symptoms, no diaphoresis it radiated to her left shoulder.   At the time of my evaluation patient states the chest pain is subsided, she complains mostly of the left lower quadrant pain with some nausea but no emesis, last bowel movement was semisolid w/o blood or mucus..... Patient gives a history of frequent bowel problems due to "IBS", she followed regularly with [facility]  No fever  Or chills ,   In ED--- creatinine is now 1.46 from  0.7 a few hrs ago, pt had CTA chest/abd/pelvis on 04/26/17, IV fluid boluses ordered, UA suspicious for UTI, cultures ordered  CT abdomen and pelvis as noted below  CT Abd/pelvis IMPRESSION:  Chest Impression:  1. No evidence of aortic dissection or aneurysm. 2. Mild basilar atelectasis.  Abdomen/Pelvis Impression:  1. ACUTE DIVERTICULITIS of the proximal sigmoid colon. No abscess or macro perforation. 2. Intimal calcification of the abdominal aorta without evidence of dissection or aneurysm.     Review of systems:    In addition to the HPI above,   A full 12 point Review of 10 Systems was done, except as stated above, all other Review of 10 Systems were  negative.    Social History:  Reviewed by me    Social History   Tobacco Use  . Smoking status: Current Every Day Smoker    Packs/day: 0.25    Years: 15.00    Pack years: 3.75    Types: Cigarettes  . Smokeless tobacco: Never Used  . Tobacco comment: occ alcohol  Substance Use Topics  . Alcohol use: Yes    Alcohol/week: 0.0 oz    Comment: Rare       Family History :  Reviewed by me    Family History  Problem Relation Age of Onset  . Depression Mother   . Anxiety disorder Mother   . Depression Father   .  Cancer Father        angioplasty thyroid carcenoma  . Diabetes Father   . Hypertension Father   . Thyroid disease Father   . Bipolar disorder Sister   . Hypertension Sister   . Schizophrenia Maternal Grandfather   . Depression Maternal Grandmother   . Anxiety disorder Maternal Grandmother   . Anxiety disorder Paternal Grandfather   . Depression Paternal Grandfather   . Diabetes Paternal Grandfather   . Anxiety disorder Paternal Grandmother   . Depression Paternal Grandmother   . Cancer Paternal Grandmother        stomach  . Diabetes Paternal Grandmother      Home Medications:   Prior to Admission medications   Medication Sig Start Date End Date Taking? Authorizing Provider  amoxicillin (AMOXIL) 500 MG capsule Take 2,000 mg by mouth See admin instructions. Take 2,000 mg by mouth one hour prior to dental appointments   Yes [provider]  buPROPion (WELLBUTRIN XL) 150 MG 24 hr tablet Take 450 mg by mouth every morning.    Yes [provider]  cholecalciferol (VITAMIN D) 1000 units tablet Take 1,000 Units by mouth daily.    Yes [provider]  clonazePAM (KLONOPIN) 1 MG tablet Take 1 mg by mouth 2 (two) times daily.    Yes [provider]  divalproex (DEPAKOTE ER) 500 MG 24 hr tablet Take 1,000 mg by mouth at bedtime.    Yes [provider]  escitalopram (LEXAPRO) 20 MG tablet Take 20 mg by mouth daily.   Yes  [provider]  ibuprofen (ADVIL,MOTRIN) 800 MG tablet Take 800 mg by mouth 2 (two) times daily as needed (for pain).   Yes [provider]  losartan (COZAAR) 50 MG tablet Take 50 mg by mouth daily. 07/03/15  Yes [provider]  Multiple Vitamin (MULTIVITAMIN) tablet Take 1 tablet by mouth at bedtime.    Yes [provider]  Omega-3 Fatty Acids (FISH OIL PO) Take 1 capsule by mouth at bedtime.    Yes [provider]  ranitidine (ZANTAC) 150 MG tablet Take 150 mg by mouth daily as needed for heartburn.   Yes [provider]  SYNTHROID 137 MCG tablet Take 137 mcg by mouth daily. 02/17/16  Yes [provider]  traZODone (DESYREL) 100 MG tablet Take 100 mg by mouth at bedtime.  10/16/14  Yes [provider]  vitamin E (VITAMIN E-400) 400 UNIT capsule Take 1,200 Units by mouth daily.   Yes [provider]     Allergies:     Allergies  Allergen Reactions  . Morphine And Related Other (See Comments)    Headaches   . Prednisone Other (See Comments)    Widespread pain throughout body  . Codeine Other (See Comments)    Must eat if taking codeine or pre-treat with a med for nausea     Physical Exam:   Vitals  Blood pressure 132/89, pulse 80, temperature 98.3 F (36.8 C), temperature source Oral, resp. rate 17, height 5\' 4"  (1.626 m), weight 99.8 kg (220 lb), SpO2 93 %.  Physical Examination: General appearance - alert, well appearing, and in no distress  Mental status - alert, oriented to person, place, and time, affect is anxious Eyes - sclera anicteric Neck - supple, no JVD elevation , Chest - clear  to auscultation bilaterally, symmetrical air movement,  Heart - S1 and S2 normal,  Abdomen - soft, left lower quadrant tenderness without rebound or guarding, bowel sounds  present, not distended, no CVA area tenderness Neurological - screening mental status exam normal, neck supple without rigidity, cranial  nerves II through XII intact, DTR's normal and symmetric Extremities - no pedal edema noted, intact peripheral pulses  Skin - warm, dry     Data Review:    CBC Recent Labs  Lab 04/26/17 1856  WBC 11.4*  HGB 13.9  HCT 40.7  PLT 206  MCV 89.1  MCH 30.4  MCHC 34.2  RDW 13.1   ------------------------------------------------------------------------------------------------------------------  Chemistries  Recent Labs  Lab 04/26/17 1856 04/26/17 2347  NA 136  --   K 3.7  --   CL 99*  --   CO2 27  --   GLUCOSE 93  --   BUN 10  --   CREATININE 0.69 1.46*  CALCIUM 8.6*  --   AST 20  --   ALT 16  --   ALKPHOS 47  --   BILITOT 0.9  --    ------------------------------------------------------------------------------------------------------------------ estimated creatinine clearance is 47.6 mL/min (A) (by C-G formula based on SCr of 1.46 mg/dL (H)). ------------------------------------------------------------------------------------------------------------------ No results for input(s): TSH, T4TOTAL, T3FREE, THYROIDAB in the last 72 hours.  Invalid input(s): FREET3   Coagulation profile No results for input(s): INR, PROTIME in the last 168 hours. ------------------------------------------------------------------------------------------------------------------- No results for input(s): DDIMER in the last 72 hours. -------------------------------------------------------------------------------------------------------------------  Cardiac Enzymes Recent Labs  Lab 04/26/17 1856 04/26/17 2347  CKMB 1.9  --   TROPONINI <0.03 <0.03   ------------------------------------------------------------------------------------------------------------------ No results found for: BNP   ---------------------------------------------------------------------------------------------------------------  Urinalysis    Component Value Date/Time   COLORURINE YELLOW 04/26/2017 1808    APPEARANCEUR HAZY (A) 04/26/2017 1808   LABSPEC 1.019 04/26/2017 1808   PHURINE 7.0 04/26/2017 1808   GLUCOSEU NEGATIVE 04/26/2017 1808   HGBUR NEGATIVE 04/26/2017 1808   HGBUR negative 06/08/2008 0846   BILIRUBINUR NEGATIVE 04/26/2017 1808   KETONESUR 20 (A) 04/26/2017 1808   PROTEINUR NEGATIVE 04/26/2017 1808   UROBILINOGEN 0.2 06/24/2013 0917   NITRITE NEGATIVE 04/26/2017 1808   LEUKOCYTESUR TRACE (A) 04/26/2017 1808    ----------------------------------------------------------------------------------------------------------------   Imaging Results:    Ct Angio Chest/abd/pel For Dissection W And/or Wo Contrast  Result Date: 04/26/2017 CLINICAL DATA:  LEFT-sided chest pain abdominal pain EXAM: CT ANGIOGRAPHY CHEST, ABDOMEN AND PELVIS TECHNIQUE: Multidetector CT imaging through the chest, abdomen and pelvis was performed using the standard protocol during bolus administration of intravenous contrast. Multiplanar reconstructed images and MIPs were obtained and reviewed to evaluate the vascular anatomy. CONTRAST:  ISOVUE-370 IOPAMIDOL (ISOVUE-370) INJECTION 76% COMPARISON:  None. FINDINGS: CTA CHEST FINDINGS Cardiovascular: None IV contrast images demonstrate no intramural hematoma within the thoracic aorta. Contrast images demonstrate no dissection or aneurysm the thoracic aorta. Coronary artery calcification and aortic atherosclerotic calcification. Additionally, there is no central pulmonary embolism. Mediastinum/Nodes: No axillary supraclavicular adenopathy. No mediastinal adenopathy. No pericardial effusion. Esophagus normal. Lungs/Pleura: No suspicious pulmonary nodularity. Linear atelectasis at the LEFT lung base Musculoskeletal: No aggressive osseous lesion Review of the MIP images confirms the above findings. CTA ABDOMEN AND PELVIS FINDINGS VASCULAR Aorta: Abdominal aorta is normal caliber. Mild intimal calcification. Celiac: Widely patent SMA: Widely patent Renals: Bilateral single  renal arteries IMA: Patent Inflow: Normal Veins: Normal Review of the MIP images confirms the above findings. NON-VASCULAR Hepatobiliary: No focal hepatic lesion. No biliary duct dilatation. Gallbladder is normal. Common bile duct is normal. Pancreas: Pancreas is normal. No ductal dilatation. No pancreatic inflammation. Spleen: Normal spleen Adrenals/urinary tract: Adrenal glands and kidneys are  normal. The ureters and bladder normal. Stomach/Bowel: Stomach, small bowel, appendix, and cecum are normal. There are diverticula descending colon. At the junction of the distal descending colon sigmoid colon there is a 5 cm segment of bowel inflammation and thickening. There is thickening along the peritoneal reflection at this level. There are multiple diverticular through this region. Findings are most consistent with acute diverticulitis of the proximal sigmoid colon. No perforation or abscess. Vascular/Lymphatic: Abdominal aorta is normal caliber. No periportal or retroperitoneal adenopathy. No pelvic adenopathy. Reproductive: Uterus and ovaries normal Other: No free fluid. Musculoskeletal: No aggressive osseous lesion. Review of the MIP images confirms the above findings. IMPRESSION: Chest Impression: 1. No evidence of aortic dissection or aneurysm. 2. Mild basilar atelectasis. Abdomen / Pelvis Impression: 1. ACUTE DIVERTICULITIS of the proximal sigmoid colon. No abscess or macro perforation. 2. Intimal calcification of the abdominal aorta without evidence of dissection or aneurysm. Electronically Signed   By: Genevive Bi M.D.   On: 04/26/2017 21:14    Radiological Exams on Admission: Ct Angio Chest/abd/pel For Dissection W And/or Wo Contrast  Result Date: 04/26/2017 CLINICAL DATA:  LEFT-sided chest pain abdominal pain EXAM: CT ANGIOGRAPHY CHEST, ABDOMEN AND PELVIS TECHNIQUE: Multidetector CT imaging through the chest, abdomen and pelvis was performed using the standard protocol during bolus administration  of intravenous contrast. Multiplanar reconstructed images and MIPs were obtained and reviewed to evaluate the vascular anatomy. CONTRAST:  ISOVUE-370 IOPAMIDOL (ISOVUE-370) INJECTION 76% COMPARISON:  None. FINDINGS: CTA CHEST FINDINGS Cardiovascular: None IV contrast images demonstrate no intramural hematoma within the thoracic aorta. Contrast images demonstrate no dissection or aneurysm the thoracic aorta. Coronary artery calcification and aortic atherosclerotic calcification. Additionally, there is no central pulmonary embolism. Mediastinum/Nodes: No axillary supraclavicular adenopathy. No mediastinal adenopathy. No pericardial effusion. Esophagus normal. Lungs/Pleura: No suspicious pulmonary nodularity. Linear atelectasis at the LEFT lung base Musculoskeletal: No aggressive osseous lesion Review of the MIP images confirms the above findings. CTA ABDOMEN AND PELVIS FINDINGS VASCULAR Aorta: Abdominal aorta is normal caliber. Mild intimal calcification. Celiac: Widely patent SMA: Widely patent Renals: Bilateral single renal arteries IMA: Patent Inflow: Normal Veins: Normal Review of the MIP images confirms the above findings. NON-VASCULAR Hepatobiliary: No focal hepatic lesion. No biliary duct dilatation. Gallbladder is normal. Common bile duct is normal. Pancreas: Pancreas is normal. No ductal dilatation. No pancreatic inflammation. Spleen: Normal spleen Adrenals/urinary tract: Adrenal glands and kidneys are normal. The ureters and bladder normal. Stomach/Bowel: Stomach, small bowel, appendix, and cecum are normal. There are diverticula descending colon. At the junction of the distal descending colon sigmoid colon there is a 5 cm segment of bowel inflammation and thickening. There is thickening along the peritoneal reflection at this level. There are multiple diverticular through this region. Findings are most consistent with acute diverticulitis of the proximal sigmoid colon. No perforation or abscess.  Vascular/Lymphatic: Abdominal aorta is normal caliber. No periportal or retroperitoneal adenopathy. No pelvic adenopathy. Reproductive: Uterus and ovaries normal Other: No free fluid. Musculoskeletal: No aggressive osseous lesion. Review of the MIP images confirms the above findings. IMPRESSION: Chest Impression: 1. No evidence of aortic dissection or aneurysm. 2. Mild basilar atelectasis. Abdomen / Pelvis Impression: 1. ACUTE DIVERTICULITIS of the proximal sigmoid colon. No abscess or macro perforation. 2. Intimal calcification of the abdominal aorta without evidence of dissection or aneurysm. Electronically Signed   By: Genevive Bi M.D.   On: 04/26/2017 21:14    DVT Prophylaxis -SCD  /heparin AM Labs Ordered, also please review Full Orders  Family Communication: Admission, patients condition and plan of care including tests being ordered have been discussed with the patient who indicate understanding and agree with the plan   Code Status - Full Code  Likely DC to  Home   Condition   stable  Shon Haleourage Azarion Hove M.D on 04/27/2017 at 1:51 AM   Between 7am to 7pm - Pager - [phone] After 7pm go to [email or web address] - password [email or web address]  [facility] - Office  [phone]  Voice Recognition Reubin Milan/Dragon dictation system was used to create this note, attempts have been made to correct errors. Please contact the author with questions and/or clarifications.

## 2017-04-26 NOTE — ED Provider Notes (Signed)
Patient signed out to me at shift change.  Patient with left lower quadrant pain, low back pain, and chest pain.  Prior provider ordered CT chest/abd/pel to rule out dissection.  CT shows acute diverticulitis.  Patient has history of the same.  Unclear etiology of the patient's chest pain.  No evidence of acute ischemia on EKG.  Troponin is negative.  Dr. Jacqulyn BathLong recommends observation admission for chest pain rule out.  Will also start patient on IV antibiotic therapy for diverticulitis.  Have had difficulty controlling patient's pain.  CT impression not crossing in Epic, but read is visible in PACs is documented below.   IMPRESSION:  Chest Impression:  1. No evidence of aortic dissection or aneurysm. 2. Mild basilar atelectasis.  Abdomen/Pelvis Impression:  1. ACUTE DIVERTICULITIS of the proximal sigmoid colon. No abscess or macro perforation. 2. Intimal calcification of the abdominal aorta without evidence of dissection or aneurysm.  Electronically signed By: Genevive BiStewart Edmunds M.D. On: 04/26/2017 21:14    Appreciate Dr. Mariea ClontsEmokpae for bringing patient into the hospital.     Roxy HorsemanBrowning, Latoya Tremblay, PA-C 04/26/17 2350    Long, Arlyss RepressJoshua G, MD 04/27/17 [phone]

## 2017-04-26 NOTE — ED Notes (Signed)
Pt desaturated to 86; placed on 2L O2 nasal cannula increased to 95%

## 2017-04-26 NOTE — ED Notes (Signed)
Patient transported to CT 

## 2017-04-26 NOTE — MAU Provider Note (Signed)
History    CSN: [id number]  Arrival date and time: 04/26/17 1745   First Provider Initiated Contact with Patient 04/26/17 1837     Chief Complaint  Patient presents with  . Abdominal Pain  . Chest Pain  . Arm Pain   HPI LIZET KELSO is a 57 y.o. G2P2002 non pregnant female who presents with chest pain. She states she started noticing the chest pain 4 days ago but today it became a 10/10 and radiates down her left arm. She also reports lower abdominal pain. She denies any vaginal bleeding or discharge. She states she came to [facility] hospital because the urgent care centers were closed and she didn't want to go to the ED.  OB History    Gravida  2   Para  2   Term  2   Preterm      AB      Living  2     SAB      TAB      Ectopic      Multiple      Live Births              Past Medical History:  Diagnosis Date  . Active smoker    1 PPD  . Anxiety   . Arthritis   . Colitis   . Depression   . Diverticulitis   . History of IBS   . Hypertension   . Insomnia   . Mastodynia   . Osteopenia 03/2017   T score -1.6 FRAX 5.2%/0.2% stable from prior DEXA  . Sarcoid   . Thyroid disease   . UTI (lower urinary tract infection)   . Vaginal septum     Past Surgical History:  Procedure Laterality Date  . CESAREAN SECTION  1981, 1984  . CHOLECYSTECTOMY N/A 01/05/2013   Procedure: LAPAROSCOPIC CHOLECYSTECTOMY WITH attempted INTRAOPERATIVE CHOLANGIOGRAM;  Surgeon: Atilano Ina, MD;  Location: [facility] OR;  Service: General;  Laterality: N/A;  . COPD    . HYSTEROSCOPY  02/13/07   HYSTEROSCOPY, D&C, MIRENA INSERTED  . REPLACEMENT TOTAL KNEE Right 05/12  . THYROIDECTOMY  2002  . TONSILLECTOMY      Family History  Problem Relation Age of Onset  . Depression Mother   . Anxiety disorder Mother   . Depression Father   . Cancer Father        angioplasty thyroid carcenoma  . Diabetes Father   . Hypertension Father   . Thyroid disease Father   . Bipolar disorder  Sister   . Hypertension Sister   . Schizophrenia Maternal Grandfather   . Depression Maternal Grandmother   . Anxiety disorder Maternal Grandmother   . Anxiety disorder Paternal Grandfather   . Depression Paternal Grandfather   . Diabetes Paternal Grandfather   . Anxiety disorder Paternal Grandmother   . Depression Paternal Grandmother   . Cancer Paternal Grandmother        stomach  . Diabetes Paternal Grandmother     Social History   Tobacco Use  . Smoking status: Current Every Day Smoker    Packs/day: 0.25    Years: 15.00    Pack years: 3.75    Types: Cigarettes  . Smokeless tobacco: Never Used  . Tobacco comment: occ alcohol  Substance Use Topics  . Alcohol use: Yes    Alcohol/week: 0.0 oz    Comment: Rare  . Drug use: No    Allergies:  Allergies  Allergen Reactions  . Prednisone  Widespread pain throughout body.   . Codeine Rash    Headache.--ok with codeine if she eats.     Medications Prior to Admission  Medication Sig Dispense Refill Last Dose  . amoxicillin (AMOXIL) 500 MG capsule Take 2,000 mg by mouth. 1 hour prior to dental appointments.   Taking  . buPROPion (WELLBUTRIN XL) 150 MG 24 hr tablet Take 300 mg by mouth daily.    Taking  . cholecalciferol (VITAMIN D) 1000 units tablet Take 2,000 Units by mouth daily.   Taking  . clonazePAM (KLONOPIN) 1 MG tablet Take 1 mg by mouth 2 (two) times daily.    Taking  . divalproex (DEPAKOTE ER) 500 MG 24 hr tablet Take 1,000 mg by mouth at bedtime.    Taking  . escitalopram (LEXAPRO) 20 MG tablet Take 20 mg by mouth daily.   Taking  . losartan (COZAAR) 50 MG tablet Take 50 mg by mouth daily.  11 Taking  . Multiple Vitamin (MULTIVITAMIN) tablet Take 1 tablet by mouth daily.   Taking  . Omega-3 Fatty Acids (FISH OIL PO) Take 1 tablet by mouth daily.   Taking  . SYNTHROID 137 MCG tablet Take 137 mcg by mouth daily.  6 Taking  . traZODone (DESYREL) 100 MG tablet Take 100 mg by mouth at bedtime.   2 Taking     Review of Systems  Constitutional: Negative.  Negative for fatigue and fever.  HENT: Negative.   Respiratory: Positive for chest tightness and shortness of breath.   Cardiovascular: Positive for chest pain.  Gastrointestinal: Positive for abdominal pain. Negative for constipation, diarrhea, nausea and vomiting.  Genitourinary: Negative.  Negative for dysuria.  Neurological: Negative.  Negative for dizziness and headaches.   Physical Exam   Blood pressure 125/82, pulse 95, temperature 98.3 F (36.8 C), temperature source Oral, resp. rate 18, height 5\' 4"  (1.626 m), weight 220 lb (99.8 kg), SpO2 93 %.  Physical Exam  Nursing note and vitals reviewed. Constitutional: She is oriented to person, place, and time. She appears well-developed and well-nourished. No distress.  HENT:  Head: Normocephalic.  Eyes: Pupils are equal, round, and reactive to light.  Cardiovascular: Normal rate, regular rhythm and normal heart sounds.  Respiratory: Effort normal and breath sounds normal. Tachypnea noted. No respiratory distress.  GI: Soft. Bowel sounds are normal. She exhibits no distension. There is tenderness.  Neurological: She is alert and oriented to person, place, and time.  Skin: Skin is warm and dry.  Psychiatric: She has a normal mood and affect. Her behavior is normal. Judgment and thought content normal.    MAU Course  Procedures Results for orders placed or performed during the hospital encounter of 04/26/17 (from the past 24 hour(s))  Urinalysis, Routine w reflex microscopic     Status: Abnormal   Collection Time: 04/26/17  6:08 PM  Result Value Ref Range   Color, Urine YELLOW YELLOW   APPearance HAZY (A) CLEAR   Specific Gravity, Urine 1.019 1.005 - 1.030   pH 7.0 5.0 - 8.0   Glucose, UA NEGATIVE NEGATIVE mg/dL   Hgb urine dipstick NEGATIVE NEGATIVE   Bilirubin Urine NEGATIVE NEGATIVE   Ketones, ur 20 (A) NEGATIVE mg/dL   Protein, ur NEGATIVE NEGATIVE mg/dL   Nitrite  NEGATIVE NEGATIVE   Leukocytes, UA TRACE (A) NEGATIVE   RBC / HPF 0-5 0 - 5 RBC/hpf   WBC, UA 6-30 0 - 5 WBC/hpf   Bacteria, UA RARE (A) NONE SEEN   Squamous Epithelial /  LPF 0-5 (A) NONE SEEN   Mucus PRESENT    MDM Consulted with Dr. Edward Jolly- will transfer patient to [facility] Genene Churn at [facility] accepted transfer. Patient to be transferred by [facility]  ED EKG- normal sinus rhythm, non specific ST abnormality IV Normal Saline Aspirin  CBC, CMP Troponin I CKMB Procalcitonin  Assessment and Plan   1. Chest pain of uncertain etiology    -Transfer to [facility] ED via [facility] -Patient stable at time of transfer  Rolm Bookbinder CNM 04/26/2017, 6:42 PM

## 2017-04-26 NOTE — ED Provider Notes (Signed)
Emergency Department Provider Note   I have reviewed the triage vital signs and the nursing notes.   HISTORY  Chief Complaint Abdominal Pain; Chest Pain; and Arm Pain   HPI Latoya Clark is a 57 y.o. female with PMH of tobacco use, diverticulitis, and HTN presents to the emergency department for evaluation of chest pain, abdominal pain, back pain.  The patient was initially evaluated at the MAU at [facility] where some workup has been started including EKG and troponins.  Patient describes 3 days of left lower quadrant abdominal pain without diarrhea, bloody bowel movements, nausea, or diarrhea.  She has not experience any vaginal bleeding or discharge.  She has had some orange color to her urine but denies dysuria, hesitancy, or urgency.  This is continued over the last 3 days but today she developed left-sided chest pain which seem to radiate to her back along with some lower back pain.  She continues to deny fever or vomiting.  Denies shortness of breath.  When she presented to the emergency department at [facility] she was told that her EKG was abnormal and because of her chest discomfort she was referred to the emergency department.  No radiation of symptoms.  Her pain is moderate to severe in intensity.   Past Medical History:  Diagnosis Date  . Active smoker    1 PPD  . Anxiety   . Arthritis   . Colitis   . Depression   . Diverticulitis   . History of IBS   . Hypertension   . Insomnia   . Mastodynia   . Osteopenia 03/2017   T score -1.6 FRAX 5.2%/0.2% stable from prior DEXA  . Sarcoid   . Thyroid disease   . UTI (lower urinary tract infection)   . Vaginal septum     Patient Active Problem List   Diagnosis Date Noted  . AKI (acute kidney injury) (HCC) 04/27/2017  . Diverticulitis large intestine 04/26/2017  . Anxiety and depression 02/21/2016  . GERD (gastroesophageal reflux disease) 02/21/2016  . Calculus of gallbladder with other cholecystitis,  without mention of obstruction 09/30/2012  . Osteopenia 01/29/2011  . Arthritis   . Current smoker   . Mastodynia   . Vaginal septum   . Insomnia   . CARPAL TUNNEL SYNDROME, BILATERAL 07/01/2008  . 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 SORE THROAT 02/19/2008  . FATIGUE 02/19/2008  . Chest pain 02/19/2008  . ANXIETY DISORDER, ACUTE 11/07/2007  . HEADACHE 09/05/2007  . TOXIC EFFECT OF VENOM 08/19/2007  . ANGULAR CHEILITIS 07/31/2007  . Hypothyroidism 06/24/2007  . DEPRESSION 06/24/2007  . Essential hypertension 06/24/2007  . Sarcoidosis of lung (HCC) 06/24/2007  . LOW BACK PAIN 06/24/2007    Past Surgical History:  Procedure Laterality Date  . CESAREAN SECTION  1981, 1984  . CHOLECYSTECTOMY N/A 01/05/2013   Procedure: LAPAROSCOPIC CHOLECYSTECTOMY WITH attempted INTRAOPERATIVE CHOLANGIOGRAM;  Surgeon: Atilano Ina, MD;  Location: [facility] OR;  Service: General;  Laterality: N/A;  . COPD    . HYSTEROSCOPY  02/13/07   HYSTEROSCOPY, D&C, MIRENA INSERTED  . REPLACEMENT TOTAL KNEE Right 05/12  . THYROIDECTOMY  2002  . TONSILLECTOMY        Allergies Morphine and related; Prednisone; and Codeine  Family History  Problem Relation Age of Onset  . Depression Mother   . Anxiety disorder Mother   . Depression Father   . Cancer Father        angioplasty thyroid carcenoma  . Diabetes Father   .  Hypertension Father   . Thyroid disease Father   . Bipolar disorder Sister   . Hypertension Sister   . Schizophrenia Maternal Grandfather   . Depression Maternal Grandmother   . Anxiety disorder Maternal Grandmother   . Anxiety disorder Paternal Grandfather   . Depression Paternal Grandfather   . Diabetes Paternal Grandfather   . Anxiety disorder Paternal Grandmother   . Depression Paternal Grandmother   . Cancer Paternal Grandmother        stomach  . Diabetes Paternal Grandmother     Social History Social History   Tobacco Use  . Smoking status: Current Every Day Smoker     Packs/day: 0.25    Years: 15.00    Pack years: 3.75    Types: Cigarettes  . Smokeless tobacco: Never Used  . Tobacco comment: occ alcohol  Substance Use Topics  . Alcohol use: Yes    Alcohol/week: 0.0 oz    Comment: Rare  . Drug use: No    Review of Systems  Constitutional: No fever/chills Eyes: No visual changes. ENT: No sore throat. Cardiovascular: Positive chest pain. Respiratory: Denies shortness of breath. Gastrointestinal: Positive LLQ abdominal pain.  No nausea, no vomiting.  No diarrhea.  No constipation. Genitourinary: Negative for dysuria. Musculoskeletal: Positive for back pain. Skin: Negative for rash. Neurological: Negative for headaches, focal weakness or numbness.  10-point ROS otherwise negative.  ____________________________________________   PHYSICAL EXAM:  VITAL SIGNS: ED Triage Vitals  Enc Vitals Group     BP 04/26/17 1806 125/82     Pulse Rate 04/26/17 1806 95     Resp 04/26/17 1806 18     Temp 04/26/17 1806 98.3 F (36.8 C)     Temp Source 04/26/17 1806 Oral     SpO2 04/26/17 1806 93 %     Weight 04/26/17 1806 220 lb (99.8 kg)     Height 04/26/17 1806 5\' 4"  (1.626 m)     Pain Score 04/26/17 1807 10   Constitutional: Alert and oriented. Well appearing and in no acute distress. Eyes: Conjunctivae are normal. PERRL. EOMI. Head: Atraumatic. Nose: No congestion/rhinnorhea. Mouth/Throat: Mucous membranes are moist.  Neck: No stridor.  Cardiovascular: Normal rate, regular rhythm. Good peripheral circulation. Grossly normal heart sounds.   Respiratory: Normal respiratory effort.  No retractions. Lungs CTAB. Gastrointestinal: Soft and with focal LLQ abdominal tenderness. No rebound or guarding. No distention.  Musculoskeletal: No lower extremity tenderness nor edema. No gross deformities of extremities. Neurologic:  Normal speech and language. No gross focal neurologic deficits are appreciated.  Skin:  Skin is warm, dry and intact. No rash  noted.  ____________________________________________   LABS (all labs ordered are listed, but only abnormal results are displayed)  Labs Reviewed  URINALYSIS, ROUTINE W REFLEX MICROSCOPIC - Abnormal; Notable for the following components:      Result Value   APPearance HAZY (*)    Ketones, ur 20 (*)    Leukocytes, UA TRACE (*)    Bacteria, UA RARE (*)    Squamous Epithelial / LPF 0-5 (*)    All other components within normal limits  CBC - Abnormal; Notable for the following components:   WBC 11.4 (*)    All other components within normal limits  COMPREHENSIVE METABOLIC PANEL - Abnormal; Notable for the following components:   Chloride 99 (*)    Calcium 8.6 (*)    All other components within normal limits  BASIC METABOLIC PANEL - Abnormal; Notable for the following components:   Potassium 3.3 (*)  BUN 5 (*)    Calcium 7.6 (*)    All other components within normal limits  CBC - Abnormal; Notable for the following components:   RBC 3.80 (*)    Hemoglobin 11.3 (*)    HCT 34.2 (*)    All other components within normal limits  CBC - Abnormal; Notable for the following components:   WBC 10.7 (*)    HCT 35.9 (*)    All other components within normal limits  CREATININE, SERUM - Abnormal; Notable for the following components:   Creatinine, Ser 1.46 (*)    GFR calc non Af Amer 38 (*)    GFR calc Af Amer 44 (*)    All other components within normal limits  URINE CULTURE  CULTURE, BLOOD (ROUTINE X 2)  CULTURE, BLOOD (ROUTINE X 2)  URINE CULTURE  TROPONIN I  CK TOTAL AND CKMB (NOT AT [facility])  PROCALCITONIN  TROPONIN I  TROPONIN I  HIV ANTIBODY (ROUTINE TESTING)   ____________________________________________  EKG   EKG Interpretation  Date/Time:  Friday April 26 2017 19:35:56 EDT Ventricular Rate:  88 PR Interval:    QRS Duration: 83 QT Interval:  371 QTC Calculation: 449 R Axis:   17 Text Interpretation:  Sinus rhythm No STEMI  Confirmed by Alona BeneLong, Bellamia Ferch [phone]([id number]) on  04/26/2017 8:10:44 PM       ____________________________________________  RADIOLOGY  Ct Angio Chest/abd/pel For Dissection W And/or Wo Contrast  Result Date: 04/26/2017 CLINICAL DATA:  LEFT-sided chest pain abdominal pain EXAM: CT ANGIOGRAPHY CHEST, ABDOMEN AND PELVIS TECHNIQUE: Multidetector CT imaging through the chest, abdomen and pelvis was performed using the standard protocol during bolus administration of intravenous contrast. Multiplanar reconstructed images and MIPs were obtained and reviewed to evaluate the vascular anatomy. CONTRAST:  100mL ISOVUE-370 IOPAMIDOL (ISOVUE-370) INJECTION 76% COMPARISON:  None. FINDINGS: CTA CHEST FINDINGS Cardiovascular: None IV contrast images demonstrate no intramural hematoma within the thoracic aorta. Contrast images demonstrate no dissection or aneurysm the thoracic aorta. Coronary artery calcification and aortic atherosclerotic calcification. Additionally, there is no central pulmonary embolism. Mediastinum/Nodes: No axillary supraclavicular adenopathy. No mediastinal adenopathy. No pericardial effusion. Esophagus normal. Lungs/Pleura: No suspicious pulmonary nodularity. Linear atelectasis at the LEFT lung base Musculoskeletal: No aggressive osseous lesion Review of the MIP images confirms the above findings. CTA ABDOMEN AND PELVIS FINDINGS VASCULAR Aorta: Abdominal aorta is normal caliber. Mild intimal calcification. Celiac: Widely patent SMA: Widely patent Renals: Bilateral single renal arteries IMA: Patent Inflow: Normal Veins: Normal Review of the MIP images confirms the above findings. NON-VASCULAR Hepatobiliary: No focal hepatic lesion. No biliary duct dilatation. Gallbladder is normal. Common bile duct is normal. Pancreas: Pancreas is normal. No ductal dilatation. No pancreatic inflammation. Spleen: Normal spleen Adrenals/urinary tract: Adrenal glands and kidneys are normal. The ureters and bladder normal. Stomach/Bowel: Stomach, small bowel, appendix,  and cecum are normal. There are diverticula descending colon. At the junction of the distal descending colon sigmoid colon there is a 5 cm segment of bowel inflammation and thickening. There is thickening along the peritoneal reflection at this level. There are multiple diverticular through this region. Findings are most consistent with acute diverticulitis of the proximal sigmoid colon. No perforation or abscess. Vascular/Lymphatic: Abdominal aorta is normal caliber. No periportal or retroperitoneal adenopathy. No pelvic adenopathy. Reproductive: Uterus and ovaries normal Other: No free fluid. Musculoskeletal: No aggressive osseous lesion. Review of the MIP images confirms the above findings. IMPRESSION: Chest Impression: 1. No evidence of aortic dissection or aneurysm. 2. Mild basilar atelectasis. Abdomen /  Pelvis Impression: 1. ACUTE DIVERTICULITIS of the proximal sigmoid colon. No abscess or macro perforation. 2. Intimal calcification of the abdominal aorta without evidence of dissection or aneurysm. Electronically Signed   By: Genevive Bi M.D.   On: 04/26/2017 21:14    ____________________________________________   PROCEDURES  Procedure(s) performed:   Procedures  None ____________________________________________   INITIAL IMPRESSION / ASSESSMENT AND PLAN / ED COURSE  Pertinent labs & imaging results that were available during my care of the patient were reviewed by me and considered in my medical decision making (see chart for details).  Patient presents to the emergency department for evaluation of a constellation of symptoms including left lower quadrant abdominal pain with associated chest pain radiating to the back.  He is relatively uncomfortable.  Her EKG is reviewed with no acute findings.  Labs drawn at the [facility] reviewed which show normal troponin.  They drew a pro-calcitonin and CK-MB which are pending.  Doubt infectious etiology.  Chest x-ray with no acute findings.  Plan for  pain control here and will obtain a CT of the chest, abdomen, pelvis to rule out dissection.  Patient has several risk factors including smoking history and hypertension but pulses are equal.   Labs reviewed with no acute findings. CT chest, abdomen, and pelvis is pending. Care transferred to [facility] [place] who will follow results and likely admit for pain control and CP r/o.   ____________________________________________  FINAL CLINICAL IMPRESSION(S) / ED DIAGNOSES  Final diagnoses:  Chest pain of uncertain etiology  LLQ abdominal pain     MEDICATIONS GIVEN DURING THIS VISIT:  Medications  iopamidol (ISOVUE-370) 76 % injection (has no administration in time range)  clonazePAM (KLONOPIN) tablet 1 mg (1 mg Oral Given 04/27/17 0100)  escitalopram (LEXAPRO) tablet 20 mg (has no administration in time range)  traZODone (DESYREL) tablet 100 mg (100 mg Oral Given 04/27/17 0100)  divalproex (DEPAKOTE ER) 24 hr tablet 1,000 mg (1,000 mg Oral Given 04/27/17 0100)  buPROPion (WELLBUTRIN XL) 24 hr tablet 450 mg (has no administration in time range)  levothyroxine (SYNTHROID, LEVOTHROID) tablet 137 mcg (has no administration in time range)  multivitamin with minerals tablet 1 tablet (1 tablet Oral Given 04/27/17 0100)  cholecalciferol (VITAMIN D) tablet 1,000 Units (has no administration in time range)  omega-3 acid ethyl esters (LOVAZA) capsule 1 g (1 g Oral Not Given 04/27/17 0000)  vitamin E capsule 1,200 Units (has no administration in time range)  famotidine (PEPCID) tablet 20 mg (20 mg Oral Given 04/27/17 0100)  sodium chloride flush (NS) 0.9 % injection 3 mL (3 mLs Intravenous Not Given 04/27/17 0100)  sodium chloride flush (NS) 0.9 % injection 3 mL (has no administration in time range)  0.9 %  sodium chloride infusion (has no administration in time range)  acetaminophen (TYLENOL) tablet 650 mg (has no administration in time range)    Or  acetaminophen (TYLENOL) suppository 650 mg (has no  administration in time range)  senna (SENOKOT) tablet 8.6 mg (8.6 mg Oral Not Given 04/27/17 0000)  polyethylene glycol (MIRALAX / GLYCOLAX) packet 17 g (has no administration in time range)  ondansetron (ZOFRAN) tablet 4 mg (has no administration in time range)    Or  ondansetron (ZOFRAN) injection 4 mg (has no administration in time range)  albuterol (PROVENTIL) (2.5 MG/3ML) 0.083% nebulizer solution 2.5 mg (has no administration in time range)  heparin injection 5,000 Units (5,000 Units Subcutaneous Given 04/27/17 0628)  oxyCODONE (Oxy IR/ROXICODONE) immediate release tablet 5 mg (  has no administration in time range)  HYDROmorphone (DILAUDID) injection 0.5 mg (0.5 mg Intravenous Given 04/27/17 0628)  dextrose 5 %-0.45 % sodium chloride infusion ( Intravenous Transfusing/Transfer 04/27/17 0808)  piperacillin-tazobactam (ZOSYN) IVPB 3.375 g (3.375 g Intravenous Transfusing/Transfer 04/27/17 0809)  dextrose 5 %-0.45 % sodium chloride infusion ( Intravenous Stopped 04/27/17 0808)  metoprolol tartrate (LOPRESSOR) tablet 12.5 mg (12.5 mg Oral Not Given 04/27/17 0130)  aspirin chewable tablet 81 mg (has no administration in time range)  nitroGLYCERIN (NITROSTAT) SL tablet 0.4 mg (has no administration in time range)  potassium chloride SA (K-DUR,KLOR-CON) CR tablet 40 mEq (has no administration in time range)  aspirin chewable tablet 324 mg (324 mg Oral Given 04/26/17 1854)  fentaNYL (SUBLIMAZE) injection 50 mcg (50 mcg Intravenous Given 04/26/17 2027)  ondansetron (ZOFRAN) injection 4 mg (4 mg Intravenous Given 04/26/17 2027)  iopamidol (ISOVUE-370) 76 % injection 100 mL (100 mLs Intravenous Contrast Given 04/26/17 2040)  fentaNYL (SUBLIMAZE) injection 100 mcg (100 mcg Intravenous Given 04/26/17 2334)  metroNIDAZOLE (FLAGYL) IVPB 500 mg (0 mg Intravenous Stopped 04/27/17 0315)  ciprofloxacin (CIPRO) IVPB 400 mg (0 mg Intravenous Stopped 04/27/17 0045)  gi cocktail (Maalox,Lidocaine,Donnatal) (30 mLs Oral  Given 04/26/17 2334)  piperacillin-tazobactam (ZOSYN) IVPB 3.375 g (0 g Intravenous Stopped 04/27/17 0130)  sodium chloride 0.9 % bolus 1,000 mL (0 mLs Intravenous Stopped 04/27/17 0315)    Note:  This document was prepared using Dragon voice recognition software and may include unintentional dictation errors.  Alona Bene, MD Emergency Medicine    Satori Krabill, Arlyss Repress, MD 04/27/17 [phone]

## 2017-04-26 NOTE — MAU Note (Addendum)
Pt is having severe lower abdominal pain 10/10. Pain in her left arm and chest 10/10 and neck and lower back. Has had the pain for 4 days. Urine is orange.

## 2017-04-26 NOTE — ED Notes (Signed)
[facility] c/[facility]d pain; during visit pt states that she begin to have chest pain; [facility], per EMS, pt has had chest pain x4 days that radiates to back between shoulder blades; pt also has abd pain. Pt rec'd [id number]  And complains that urine is orangish color. 122/78, 84, 98 on RA, 20.

## 2017-04-27 ENCOUNTER — Encounter (HOSPITAL_COMMUNITY): Payer: Self-pay | Admitting: Family Medicine

## 2017-04-27 DIAGNOSIS — N179 Acute kidney failure, unspecified: Secondary | ICD-10-CM | POA: Diagnosis present

## 2017-04-27 LAB — CBC
HCT: 35.9 % — ABNORMAL LOW (ref 36.0–46.0)
HCT: 34.2 % — ABNORMAL LOW (ref 36.0–46.0)
HEMOGLOBIN: 12.4 g/dL (ref 12.0–15.0)
Hemoglobin: 11.3 g/dL — ABNORMAL LOW (ref 12.0–15.0)
MCH: 30.9 pg (ref 26.0–34.0)
MCH: 29.7 pg (ref 26.0–34.0)
MCHC: 33 g/dL (ref 30.0–36.0)
MCHC: 34.5 g/dL (ref 30.0–36.0)
MCV: 89.5 fL (ref 78.0–100.0)
MCV: 90 fL (ref 78.0–100.0)
PLATELETS: 223 10*3/uL (ref 150–400)
Platelets: 199 10*3/uL (ref 150–400)
RBC: 3.8 MIL/uL — ABNORMAL LOW (ref 3.87–5.11)
RBC: 4.01 MIL/uL (ref 3.87–5.11)
RDW: 12.9 % (ref 11.5–15.5)
RDW: 13.1 % (ref 11.5–15.5)
WBC: 10.7 10*3/uL — ABNORMAL HIGH (ref 4.0–10.5)
WBC: 8.5 10*3/uL (ref 4.0–10.5)

## 2017-04-27 LAB — BASIC METABOLIC PANEL
ANION GAP: 10 (ref 5–15)
BUN: 5 mg/dL — AB (ref 6–20)
CO2: 23 mmol/L (ref 22–32)
Calcium: 7.6 mg/dL — ABNORMAL LOW (ref 8.9–10.3)
Chloride: 103 mmol/L (ref 101–111)
Creatinine, Ser: 0.57 mg/dL (ref 0.44–1.00)
GFR calc Af Amer: >60 mL/min (ref >60)
GLUCOSE: 98 mg/dL (ref 65–99)
Potassium: 3.3 mmol/L — ABNORMAL LOW (ref 3.5–5.1)
SODIUM: 136 mmol/L (ref 135–145)

## 2017-04-27 LAB — CREATININE, SERUM
CREATININE: 1.46 mg/dL — AB (ref 0.44–1.00)
GFR calc Af Amer: 44 mL/min — ABNORMAL LOW (ref >60)
GFR, EST NON AFRICAN AMERICAN: 38 mL/min — AB (ref >60)

## 2017-04-27 LAB — HIV ANTIBODY (ROUTINE TESTING W REFLEX): HIV SCREEN 4TH GENERATION: NONREACTIVE

## 2017-04-27 LAB — TROPONIN I: Troponin I: <0.03 ng/mL (ref <0.03)

## 2017-04-27 MED ORDER — KETOROLAC TROMETHAMINE 30 MG/ML IJ SOLN
30.0000 mg | Freq: Once | INTRAMUSCULAR | Status: AC
  Administered 2017-04-27: 30 mg via INTRAVENOUS
  Filled 2017-04-27: qty 1

## 2017-04-27 MED ORDER — SODIUM CHLORIDE 0.9 % IV BOLUS
1000.0000 mL | Freq: Once | INTRAVENOUS | Status: AC
  Administered 2017-04-27: 1000 mL via INTRAVENOUS

## 2017-04-27 MED ORDER — POTASSIUM CHLORIDE 10 MEQ/100ML IV SOLN
10.0000 meq | INTRAVENOUS | Status: AC
  Administered 2017-04-27 (×3): 10 meq via INTRAVENOUS
  Filled 2017-04-27 (×3): qty 100

## 2017-04-27 MED ORDER — ASPIRIN 81 MG PO CHEW
81.0000 mg | CHEWABLE_TABLET | Freq: Every day | ORAL | Status: DC
Start: 2017-04-27 — End: 2017-04-29
  Administered 2017-04-27 – 2017-04-29 (×3): 81 mg via ORAL
  Filled 2017-04-27 (×3): qty 1

## 2017-04-27 MED ORDER — PIPERACILLIN-TAZOBACTAM 3.375 G IVPB
3.3750 g | Freq: Three times a day (TID) | INTRAVENOUS | Status: DC
  Administered 2017-04-27 – 2017-04-29 (×7): 3.375 g via INTRAVENOUS
  Filled 2017-04-27 (×8): qty 50

## 2017-04-27 MED ORDER — PIPERACILLIN-TAZOBACTAM 3.375 G IVPB 30 MIN
3.3750 g | Freq: Once | INTRAVENOUS | Status: AC
  Administered 2017-04-27: 3.375 g via INTRAVENOUS
  Filled 2017-04-27: qty 50

## 2017-04-27 MED ORDER — NITROGLYCERIN 0.4 MG SL SUBL
0.4000 mg | SUBLINGUAL_TABLET | SUBLINGUAL | Status: DC | PRN
  Administered 2017-04-27 (×2): 0.4 mg via SUBLINGUAL
  Filled 2017-04-27: qty 1

## 2017-04-27 MED ORDER — DEXTROSE-NACL 5-0.45 % IV SOLN
INTRAVENOUS | Status: DC
  Administered 2017-04-27 – 2017-04-28 (×2): via INTRAVENOUS

## 2017-04-27 MED ORDER — POTASSIUM CHLORIDE CRYS ER 20 MEQ PO TBCR
40.0000 meq | EXTENDED_RELEASE_TABLET | Freq: Once | ORAL | Status: AC
  Administered 2017-04-27: 40 meq via ORAL
  Filled 2017-04-27: qty 2

## 2017-04-27 MED ORDER — METOPROLOL TARTRATE 12.5 MG HALF TABLET
12.5000 mg | ORAL_TABLET | Freq: Two times a day (BID) | ORAL | Status: DC
  Administered 2017-04-27 – 2017-04-29 (×5): 12.5 mg via ORAL
  Filled 2017-04-27 (×5): qty 1

## 2017-04-27 NOTE — Progress Notes (Signed)
Lizabeth LeydenJulie C Dack [id number] Admission Data: 04/27/2017 1:38 PM Attending Provider: Alwyn RenMathews, Elizabeth G, MD  OZH:[id number]:Husain, Jerelyn ScottKarrar, MD Consults/ Treatment Team:   Lizabeth LeydenJulie C Latoya Clark is a 57 y.o. female patient admitted from ED awake, alert  & orientated  X 3,  Full Code, VSS - Blood pressure 100/84, pulse 76, temperature 98.2 F (36.8 C), temperature source Oral, resp. rate 16, height 5\' 4"  (1.626 m), weight 99.8 kg (220 lb), SpO2 (!) 88 %., O2    1 L nasal cannular, no c/o shortness of breath, no c/o chest pain, no distress noted. Tele # 32 placed and pt is currently running:normal sinus rhythm.   IV site WDL:  hand right, condition patent and no redness and antecubital right, condition patent and no redness with a transparent dsg that's clean dry and intact.  Allergies:   Allergies  Allergen Reactions  . Morphine And Related Other (See Comments)    Headaches   . Prednisone Other (See Comments)    Widespread pain throughout body  . Codeine Other (See Comments)    Must eat if taking codeine or pre-treat with a med for nausea     Past Medical History:  Diagnosis Date  . Active smoker    1 PPD  . Anxiety   . Arthritis   . Colitis   . Depression   . Diverticulitis   . History of IBS   . Hypertension   . Insomnia   . Mastodynia   . Osteopenia 03/2017   T score -1.6 FRAX 5.2%/0.2% stable from prior DEXA  . Sarcoid   . Thyroid disease   . UTI (lower urinary tract infection)   . Vaginal septum     History:  obtained from chart review and ED nurse. Tobacco/alcohol: denied none  Pt orientation to unit, room and routine. Information packet given to patient/family and safety video watched.  Admission INP armband ID verified with patient/family, and in place. SR up x 2, fall risk assessment complete with Patient and family verbalizing understanding of risks associated with falls. Pt verbalizes an understanding of how to use the call bell and to call for help before getting out of bed.   Skin, clean-dry- intact without evidence of bruising, or skin tears.   No evidence of skin break down noted on exam. no rashes, no ecchymoses, no petechiae, no nodules, no jaundice, no purpura, no wounds    Will cont to monitor and assist as needed.  Camillo FlamingVicki L [name], RN 04/27/2017 1:38 PM

## 2017-04-27 NOTE — ED Notes (Signed)
Pt endorsing CP at this time.  As a result pt cannot be accepted to [facility] until pain is resolved.

## 2017-04-27 NOTE — Progress Notes (Signed)
Pharmacy Antibiotic Note  Lizabeth LeydenJulie C Zeis is a 57 y.o. female admitted on 04/26/2017 with acute diverticulitis.  Pharmacy has been consulted for Zosyn dosing.  Plan: Zosyn 3.375g IV q8h (4 hour infusion).  Height: 5\' 4"  (162.6 cm) Weight: 220 lb (99.8 kg) IBW/kg (Calculated) : 54.7  Temp (24hrs), Avg:98.3 F (36.8 C), Min:98.3 F (36.8 C), Max:98.3 F (36.8 C)  Recent Labs  Lab 04/26/17 1856  WBC 11.4*  CREATININE 0.69    Estimated Creatinine Clearance: 86.9 mL/min (by C-G formula based on SCr of 0.69 mg/dL).    Allergies  Allergen Reactions  . Morphine And Related Other (See Comments)    Headaches   . Prednisone Other (See Comments)    Widespread pain throughout body  . Codeine Other (See Comments)    Must eat if taking codeine or pre-treat with a med for nausea     Thank you for allowing pharmacy to be a part of this patient's care.  Vernard GamblesVeronda Josalin Carneiro, PharmD, BCPS  04/27/2017 12:08 AM

## 2017-04-27 NOTE — Progress Notes (Signed)
PROGRESS NOTE    Latoya Clark  ZOX:[id number] DOB: 1960-10-14 DOA: 04/26/2017 PCP: Georgann Housekeeper, MD  Brief Narrative:  57 y.o. female with past medical history relevant for hypothyroidism, hypertension, obesity, as well as history of IBS and recurrent diverticulitis who presents with complaints of left lower quadrant for about 3 days now today she developed epigastric area discomfort. Patient tells me she has a history of recurrent diverticulitis, has had multiple colonoscopies in the past.  She denies dysuria  Patient initially went to [facility] as her pain was pretty low down  in her abdomen, while at [facility] she complained of left lower quadrant pain as well as epigastric/chest discomfort, EKG was done she was sent over here for further evaluation.  Chest discomfort was associated with nausea but no palpitations no leg pains no pleuritic symptoms, no diaphoresis it radiated to her left shoulder.   At the time of my evaluation patient states the chest pain is subsided, she complains mostly of the left lower quadrant pain with some nausea but no emesis, last bowel movement was semisolid w/o blood or mucus..... Patient gives a history of frequent bowel problems due to "IBS", she followed regularly with [facility]  No fever  Or chills ,   In ED--- creatinine is now 1.46 from  0.7 a few hrs ago, pt had CTA chest/abd/pelvis on 04/26/17, IV fluid boluses ordered, UA suspicious for UTI, cultures ordered  CT abdomen and pelvis as noted below  CT Abd/pelvis IMPRESSION:  Chest Impression: 1. No evidence of aortic dissection or aneurysm. 2. Mild basilar atelectasis.  Abdomen/Pelvis Impression:  1. ACUTE DIVERTICULITIS of the proximal sigmoid colon. No abscess or macro perforation. 2. Intimal calcification of the abdominal aorta without evidence of dissection or aneurysm.      Assessment & Plan:   Principal Problem:   Diverticulitis large intestine Active  Problems:   Essential hypertension   Current smoker   Anxiety and depression   AKI (acute kidney injury) (HCC)  1)Acute Proximal Sigmoid Diverticulitis-treat empirically with IV Zosyn, n.p.o. for now except for meds, use as needed Zofran and as needed IV Dilaudid or oral Oxy IR.  Leukocytosis noted (11.4), however patient does not meet sepsis criteria.  Patient tells me she has a history of recurrent diverticulitis, has had multiple colonoscopies in the past.fu with dr Levora Angel as an outpatient.  2)Atypical Chest Pain-despite multiple cardiovascular risk factors, patient's chest pain is very atypical for angina....  Her cardiovascular risk factors include tobacco use, hypertension, status post menopause, obesity and sedentary lifestyle, , on  telemetry monitored unit, check serial troponins and EKG to rule out acute coronary syndrome .  If patient rules out for ACS, will need further cardiovascular risk stratification .  Consider cardiology consult to help decide if Stress test is needed in am Versus other diagnostic modalities.   Give aspirin, prn nitroglycerin, and betablocker.   3)Depression/Anxiety--- affect is very anxious, continue bupropion 450 mg daily, clonazepam 1 mg twice daily, Lexapro 20 mg daily, Depakote 1000mg  qhs,  and trazodone 100 mg nightly  4)HTN-  Hold Losartan 50 mg daily due to developing AK I, use metoprolol 12.5 mg twice daily,  may use IV Hydralazine 10 mg  Every 4 hours Prn for systolic blood pressure over 160 mmhg  5)Tobacco Abuse- Smoking cessation counseling for 4 minutes today, consider nicotine patch once chest pain issues resolves  6)Hypothyroidism-stable, continue levothyroxine 137 mcg daily  7)Aki- creatinine is now 1.46 from  0.7 a few  hrs ago, pt had CTA chest/abd/pelvis on 04/26/17, will hold Losartan, hydrate judiciously, ???  Component of contrast-induced nephropathy in the setting of dehydration, avoid nephrotoxic agents, avoid hypotension, consider  renal consult if renal function does not improve with hydration.  I have ordered saline bolus for patient followed by maintenance fluids  8)Possible UTI -UA suspicious for UTI, IV Zosyn as ordered pending cultures        DVT prophylaxis:lovenox Code Status:full Family Communication dw husband Disposition Plan:.tbd   Consultants: none  Procedures:none Antimicrobials:  zosyn Subjective: Reports pain better slightly.. Objective: Vitals:   04/27/17 0730 04/27/17 0745 04/27/17 0812 04/27/17 1334  BP:   107/78 100/84  Pulse:  81 83 76  Resp: 13 11 16 16   Temp:   97.8 F (36.6 C) 98.2 F (36.8 C)  TempSrc:    Oral  SpO2:  92% 92% (!) 88%  Weight:      Height:        Intake/Output Summary (Last 24 hours) at 04/27/2017 1527 Last data filed at 04/27/2017 1346 Gross per 24 hour  Intake 4204.16 ml  Output 425 ml  Net 3779.16 ml   Filed Weights   04/26/17 1806  Weight: 99.8 kg (220 lb)    Examination:  General exam: Appears calm and comfortable  Respiratory system: Clear to auscultation. Respiratory effort normal. Cardiovascular system: S1 & S2 heard, RRR. No JVD, murmurs, rubs, gallops or clicks. No pedal edema. Gastrointestinal system: Abdomen is distended, soft and tender RLQ. No organomegaly or masses felt. Normal bowel sounds heard. Central nervous system: Alert and oriented. No focal neurological deficits. Extremities: Symmetric 5 x 5 power. Skin: No rashes, lesions or ulcers Psychiatry: Judgement and insight appear normal. Mood & affect appropriate.     Data Reviewed: I have personally reviewed following labs and imaging studies  CBC: Recent Labs  Lab 04/26/17 1856 04/26/17 2347 04/27/17 0303  WBC 11.4* 10.7* 8.5  HGB 13.9 12.4 11.3*  HCT 40.7 35.9* 34.2*  MCV 89.1 89.5 90.0  PLT 206 223 199   Basic Metabolic Panel: Recent Labs  Lab 04/26/17 1856 04/26/17 2347 04/27/17 0303  NA 136  --  136  K 3.7  --  3.3*  CL 99*  --  103  CO2 27  --   23  GLUCOSE 93  --  98  BUN 10  --  5*  CREATININE 0.69 1.46* 0.57  CALCIUM 8.6*  --  7.6*   GFR: Estimated Creatinine Clearance: 86.9 mL/min (by C-G formula based on SCr of 0.57 mg/dL). Liver Function Tests: Recent Labs  Lab 04/26/17 1856  AST 20  ALT 16  ALKPHOS 47  BILITOT 0.9  PROT 7.4  ALBUMIN 3.7   No results for input(s): LIPASE, AMYLASE in the last 168 hours. No results for input(s): AMMONIA in the last 168 hours. Coagulation Profile: No results for input(s): INR, PROTIME in the last 168 hours. Cardiac Enzymes: Recent Labs  Lab 04/26/17 1856 04/26/17 2347 04/27/17 0303  CKTOTAL 182  --   --   CKMB 1.9  --   --   TROPONINI <0.03 <0.03 <0.03   BNP (last 3 results) No results for input(s): PROBNP in the last 8760 hours. HbA1C: No results for input(s): HGBA1C in the last 72 hours. CBG: No results for input(s): GLUCAP in the last 168 hours. Lipid Profile: No results for input(s): CHOL, HDL, LDLCALC, TRIG, CHOLHDL, LDLDIRECT in the last 72 hours. Thyroid Function Tests: No results for input(s): TSH, T4TOTAL, FREET4,  T3FREE, THYROIDAB in the last 72 hours. Anemia Panel: No results for input(s): VITAMINB12, FOLATE, FERRITIN, TIBC, IRON, RETICCTPCT in the last 72 hours. Sepsis Labs: Recent Labs  Lab 04/26/17 1856  PROCALCITON <0.10    No results found for this or any previous visit (from the past 240 hour(s)).       Radiology Studies: Ct Angio Chest/abd/pel For Dissection W And/or Wo Contrast  Result Date: 04/26/2017 CLINICAL DATA:  LEFT-sided chest pain abdominal pain EXAM: CT ANGIOGRAPHY CHEST, ABDOMEN AND PELVIS TECHNIQUE: Multidetector CT imaging through the chest, abdomen and pelvis was performed using the standard protocol during bolus administration of intravenous contrast. Multiplanar reconstructed images and MIPs were obtained and reviewed to evaluate the vascular anatomy. CONTRAST:  ISOVUE-370 IOPAMIDOL (ISOVUE-370) INJECTION 76% COMPARISON:   None. FINDINGS: CTA CHEST FINDINGS Cardiovascular: None IV contrast images demonstrate no intramural hematoma within the thoracic aorta. Contrast images demonstrate no dissection or aneurysm the thoracic aorta. Coronary artery calcification and aortic atherosclerotic calcification. Additionally, there is no central pulmonary embolism. Mediastinum/Nodes: No axillary supraclavicular adenopathy. No mediastinal adenopathy. No pericardial effusion. Esophagus normal. Lungs/Pleura: No suspicious pulmonary nodularity. Linear atelectasis at the LEFT lung base Musculoskeletal: No aggressive osseous lesion Review of the MIP images confirms the above findings. CTA ABDOMEN AND PELVIS FINDINGS VASCULAR Aorta: Abdominal aorta is normal caliber. Mild intimal calcification. Celiac: Widely patent SMA: Widely patent Renals: Bilateral single renal arteries IMA: Patent Inflow: Normal Veins: Normal Review of the MIP images confirms the above findings. NON-VASCULAR Hepatobiliary: No focal hepatic lesion. No biliary duct dilatation. Gallbladder is normal. Common bile duct is normal. Pancreas: Pancreas is normal. No ductal dilatation. No pancreatic inflammation. Spleen: Normal spleen Adrenals/urinary tract: Adrenal glands and kidneys are normal. The ureters and bladder normal. Stomach/Bowel: Stomach, small bowel, appendix, and cecum are normal. There are diverticula descending colon. At the junction of the distal descending colon sigmoid colon there is a 5 cm segment of bowel inflammation and thickening. There is thickening along the peritoneal reflection at this level. There are multiple diverticular through this region. Findings are most consistent with acute diverticulitis of the proximal sigmoid colon. No perforation or abscess. Vascular/Lymphatic: Abdominal aorta is normal caliber. No periportal or retroperitoneal adenopathy. No pelvic adenopathy. Reproductive: Uterus and ovaries normal Other: No free fluid. Musculoskeletal: No  aggressive osseous lesion. Review of the MIP images confirms the above findings. IMPRESSION: Chest Impression: 1. No evidence of aortic dissection or aneurysm. 2. Mild basilar atelectasis. Abdomen / Pelvis Impression: 1. ACUTE DIVERTICULITIS of the proximal sigmoid colon. No abscess or macro perforation. 2. Intimal calcification of the abdominal aorta without evidence of dissection or aneurysm. Electronically Signed   By: Genevive Bi M.D.   On: 04/26/2017 21:14        Scheduled Meds: . aspirin  81 mg Oral Daily  . buPROPion  450 mg Oral Daily  . cholecalciferol  1,000 Units Oral Daily  . clonazePAM  1 mg Oral BID  . divalproex  1,000 mg Oral QHS  . escitalopram  20 mg Oral Daily  . famotidine  20 mg Oral BID  . heparin  5,000 Units Subcutaneous Q8H  . levothyroxine  137 mcg Oral QAC breakfast  . metoprolol tartrate  12.5 mg Oral BID  . multivitamin with minerals  1 tablet Oral QHS  . omega-3 acid ethyl esters  1 g Oral BID  . senna  1 tablet Oral BID  . sodium chloride flush  3 mL Intravenous Q12H  . traZODone  100  mg Oral QHS  . vitamin E  1,200 Units Oral Daily   Continuous Infusions: . sodium chloride    . dextrose 5 % and 0.45% NaCl 125 mL/hr at 04/27/17 0115  . dextrose 5 % and 0.45% NaCl 125 mL/hr at 04/27/17 1030  . piperacillin-tazobactam (ZOSYN)  IV 3.375 g (04/27/17 1353)     LOS: 1 day     Alwyn RenElizabeth G Siris Hoos, MD [facility]  If 7PM-7AM, please contact night-coverage [email or web address] Password [email or web address] 04/27/2017, 3:27 PM

## 2017-04-27 NOTE — Progress Notes (Signed)
Received report from Kari,RN in the ED. 

## 2017-04-28 LAB — CBC WITH DIFFERENTIAL/PLATELET
BASOS ABS: 0 10*3/uL (ref 0.0–0.1)
BASOS PCT: 0 %
EOS ABS: 0.1 10*3/uL (ref 0.0–0.7)
Eosinophils Relative: 1 %
HCT: 31.3 % — ABNORMAL LOW (ref 36.0–46.0)
Hemoglobin: 10.2 g/dL — ABNORMAL LOW (ref 12.0–15.0)
Lymphocytes Relative: 42 %
Lymphs Abs: 3.3 10*3/uL (ref 0.7–4.0)
MCH: 29.6 pg (ref 26.0–34.0)
MCHC: 32.6 g/dL (ref 30.0–36.0)
MCV: 90.7 fL (ref 78.0–100.0)
MONO ABS: 0.5 10*3/uL (ref 0.1–1.0)
Monocytes Relative: 6 %
Neutro Abs: 4 10*3/uL (ref 1.7–7.7)
Neutrophils Relative %: 51 %
Platelets: 212 10*3/uL (ref 150–400)
RBC: 3.45 MIL/uL — ABNORMAL LOW (ref 3.87–5.11)
RDW: 12.5 % (ref 11.5–15.5)
WBC: 7.9 10*3/uL (ref 4.0–10.5)

## 2017-04-28 LAB — BASIC METABOLIC PANEL
ANION GAP: 10 (ref 5–15)
BUN: <5 mg/dL — ABNORMAL LOW (ref 6–20)
CO2: 26 mmol/L (ref 22–32)
Calcium: 8 mg/dL — ABNORMAL LOW (ref 8.9–10.3)
Chloride: 99 mmol/L — ABNORMAL LOW (ref 101–111)
Creatinine, Ser: 0.67 mg/dL (ref 0.44–1.00)
GFR calc non Af Amer: >60 mL/min (ref >60)
GLUCOSE: 106 mg/dL — AB (ref 65–99)
Potassium: 3.8 mmol/L (ref 3.5–5.1)
Sodium: 135 mmol/L (ref 135–145)

## 2017-04-28 MED ORDER — HYDROMORPHONE HCL 1 MG/ML IJ SOLN
0.5000 mg | INTRAMUSCULAR | Status: DC | PRN
  Administered 2017-04-28 (×2): 0.5 mg via INTRAVENOUS
  Filled 2017-04-28 (×3): qty 0.5

## 2017-04-28 NOTE — Evaluation (Signed)
Physical Therapy Evaluation Patient Details Name: Latoya LeydenJulie C Saltz MRN: [id number] DOB: 12/04/1960 Today's Date: 04/28/2017   History of Present Illness  57 y.o.femalewith past medical history relevant for hypothyroidism, hypertension, obesity, as well as history of IBS and recurrent diverticulitis who presents with complaints of left lower quadrant discomfort. Admitting dx: Acute Proximal Sigmoid Diverticuliti and atypical chest pain.  Clinical Impression  Patient essentially min guard for all mobility but mostly due to slight "wooziness" from medications.  Family present and supportive and able to assist patient with all needs.  No further PT needs identified.  Will sign off.    Follow Up Recommendations No PT follow up    Equipment Recommendations  None recommended by PT    Recommendations for Other Services       Precautions / Restrictions Precautions Precautions: None      Mobility  Bed Mobility Overal bed mobility: Independent                Transfers Overall transfer level: Modified independent Equipment used: None                Ambulation/Gait Ambulation/Gait assistance: Min guard Ambulation Distance (Feet): 200 Feet Assistive device: 1 person hand held assist Gait Pattern/deviations: WFL(Within Functional Limits);Wide base of support     General Gait Details: required min guard due to slight "wooziness" from medications.  Daughter providing Producer, television/film/videohand-held assistance  Stairs            Wheelchair Mobility    Modified Rankin (Stroke Patients Only)       Balance Overall balance assessment: Mild deficits observed, not formally tested                                           Pertinent Vitals/Pain Pain Assessment: No/denies pain    Home Living Family/patient expects to be discharged to:: Private residence Living Arrangements: Spouse/significant other Available Help at Discharge: Family;Available  PRN/intermittently Type of Home: House Home Access: Stairs to enter Entrance Stairs-Rails: None Entrance Stairs-Number of Steps: 3 Home Layout: One level Home Equipment: Cane - single point      Prior Function Level of Independence: Independent         Comments: limited by knee pain     Hand Dominance        Extremity/Trunk Assessment   Upper Extremity Assessment Upper Extremity Assessment: Generalized weakness    Lower Extremity Assessment Lower Extremity Assessment: Generalized weakness    Cervical / Trunk Assessment Cervical / Trunk Assessment: Normal  Communication   Communication: No difficulties  Cognition Arousal/Alertness: Awake/alert Behavior During Therapy: WFL for tasks assessed/performed Overall Cognitive Status: Within Functional Limits for tasks assessed                                        General Comments      Exercises     Assessment/Plan    PT Assessment Patent does not need any further PT services  PT Problem List         PT Treatment Interventions      PT Goals (Current goals can be found in the Care Plan section)  Acute Rehab PT Goals PT Goal Formulation: All assessment and education complete, DC therapy    Frequency     Barriers to discharge  Co-evaluation               AM-PAC PT "6 Clicks" Daily Activity  Outcome Measure Difficulty turning over in bed (including adjusting bedclothes, sheets and blankets)?: None Difficulty moving from lying on back to sitting on the side of the bed? : None Difficulty sitting down on and standing up from a chair with arms (e.g., wheelchair, bedside commode, etc,.)?: None Help needed moving to and from a bed to chair (including a wheelchair)?: None Help needed walking in hospital room?: A Little Help needed climbing 3-5 steps with a railing? : A Little 6 Click Score: 22    End of Session   Activity Tolerance: Patient tolerated treatment well Patient  left: with family/visitor present(in bathroom with family present)   PT Visit Diagnosis: Unsteadiness on feet (R26.81)    Time: 1451-1501 PT Time Calculation (min) (ACUTE ONLY): 10 min   Charges:   PT Evaluation $PT Eval Low Complexity: 1 Low     PT G Codes:        04-May-2017 [facility], PT [phone]    Olivia Canter May 04, 2017, 3:11 PM

## 2017-04-28 NOTE — Progress Notes (Signed)
Patient states she is much more comfortable, rates generalized pain 4/10.  RN will continue to monitor patient and report any changes to MD. P.J. Henderson NewcomerSexton, RN

## 2017-04-28 NOTE — Progress Notes (Signed)
Patient sleeping at this time, no problems noted.  RN will continue to monitor patient and report any changes to MD as needed.  P.J. Henderson NewcomerSexton, RN

## 2017-04-28 NOTE — Progress Notes (Signed)
Patient c/o chest pain she rated 10/10 in left chest, not radiating.  She states pain is sharp.  RN gave patient Nitroglycerine 0.4 mg SL per order at 2245.  02 increased to 2 l/m per [address].  Patient NS rhythm on telemetry, no acute distress noted.  Patient continued to have left chest pain 10/10, VSS, second Nitroglycerine 0.4 mg given per order at 2253.  Patient states she is hurting everywhere at this time.  EKG obtained and reveals NSR.  RN paged Linton FlemingsX. Blount, NP to make her aware of patient's pain and inquired if third NTG needs to be given.  Order for Toradol 30 mg IV received.  RN explained to patient that RN will not give third Nitroglycerin and will bring Toradol once verified, patient voiced understanding. RN will continue to monitor patient and report any changes to MD immediately. P.J. Eddie Koc,RN

## 2017-04-28 NOTE — Progress Notes (Signed)
PROGRESS NOTE    Latoya LeydenJulie C Clark  QIO:[id number] DOB: 08/14/1960 DOA: 04/26/2017 PCP: Georgann HousekeeperHusain, Karrar, MD  Brief Narrative:57 y.o.femalewith past medical history relevant for hypothyroidism, hypertension, obesity, as well as history of IBS and recurrent diverticulitis who presents with complaints of left lower quadrant for about 3 days now today she developed epigastric area discomfort.Patient tells me she has a history of recurrent diverticulitis,has had multiple colonoscopies in the past.She denies dysuria  [facility] as her pain was pretty low downin her abdomen,while at [facility] she complained of left lower quadrant pain as well as epigastric/chest discomfort, EKG was done she was sent over here for further evaluation.Chest discomfort was associated with nausea but no palpitations no leg pains no pleuritic symptoms,no diaphoresis it radiated to her left shoulder.  At the time of my evaluation patient states the chest pain is subsided, she complains mostly of the left lower quadrant pain with some nausea but no emesis, last bowel movement was semisolidw/oblood or mucus...Marland Kitchen.Marland Kitchen.Patient gives a history of frequent bowel problems due to"IBS",[facility]  No fever Or chills ,   In ED--- creatinine is now 1.46 from 0.7 a few hrs ago, pt had CTA chest/abd/pelvis on 04/26/17, IV fluid boluses ordered,UA suspicious for UTI, cultures ordered  CT abdomen and pelvis as noted below  CT Abd/pelvis IMPRESSION:  Chest Impression: 1. No evidence of aortic dissection or aneurysm. 2. Mild basilar atelectasis.  Abdomen/Pelvis Impression:  1. ACUTE DIVERTICULITIS of the proximal sigmoid colon. No abscess or macro perforation. 2. Intimal calcification of the abdominal aorta without evidence of dissection or aneurysm.      Assessment & Plan:   Principal Problem:   Diverticulitis large intestine Active  Problems:   Essential hypertension   Current smoker   Anxiety and depression   AKI (acute kidney injury) (HCC)  1)AcuteProximalSigmoidDiverticulitis-treat empirically with IV Zosyn, n.p.o. for now except for meds,useas needed Zofran and as needed IV Dilaudid or oral Oxy IR.Leukocytosis noted (11.4),however patient does not meet sepsis criteria.Patient tells me she has a history of recurrent diverticulitis,has had multiple colonoscopies in the past.fu with dr Levora Angelbrahmbhatt as an outpatient.  2)Atypical Chest Pain-despite multiple cardiovascular risk factors, patient's chest pain is very atypical for angina.... Her cardiovascular risk factors includetobacco use, hypertension, status post menopause, obesity and sedentary lifestyle,,on telemetry monitored unit, check serial troponins and EKG to rule out acute coronary syndrome. If patient rules out for ACS, will need further cardiovascular risk stratification . Consider cardiology consult to help decide if Stress test is needed in am Versus other diagnostic modalities. Give aspirin, prnnitroglycerin, and betablocker.  3)Depression/Anxiety---affect is very anxious, continue bupropion 450 mg daily, clonazepam 1 mg twice daily, Lexapro 20 mg daily, Depakote1000mg  qhs,and trazodone 100 mg nightly  4)HTN- Hold Losartan 50 mg daily due to developing AK I,usemetoprolol 12.5 mg twice daily,may use IV Hydralazine 10 mg Every 4 hours Prn for systolic blood pressure over 160 mmhg  5)Tobacco Abuse-Smoking cessation counseling for 4 minutes today, consider nicotine patchonce chest pain issues resolves  6)Hypothyroidism-stable, continue levothyroxine 137 mcg daily  7)Aki- creatinine is now 1.46 from 0.7 a few hrs ago, pt had CTA chest/abd/pelvis on 04/26/17, will hold Losartan, hydrate judiciously, ??? Component of contrast-induced nephropathy in the setting of dehydration,avoid nephrotoxic agents, avoid hypotension,consider  renal consult if renal function does not improve with hydration.I have ordered saline bolus for patient followed by maintenance fluids  8)Possible UTI -UA suspicious for UTI, IV Zosyn asorderedpending cultures  DVT prophylaxis:lovenox Code Status: full Family Communication:none Disposition Plan: pland dc tomorrow if she tolerates diet Consultants: none  Procedures: none Antimicrobials: zosyn  Subjective:denies pain no diarrhea.   Objective: Vitals:   04/27/17 2133 04/27/17 2244 04/27/17 2253 04/28/17 0556  BP: (!) 110/94 (!) 145/95 122/88 120/83  Pulse: 87   71  Resp: 18   18  Temp: 99 F (37.2 C)   98.7 F (37.1 C)  TempSrc: Oral   Oral  SpO2: 94%   94%  Weight:      Height:        Intake/Output Summary (Last 24 hours) at 04/28/2017 1335 Last data filed at 04/28/2017 1045 Gross per 24 hour  Intake 7132.91 ml  Output 3150 ml  Net 3982.91 ml   Filed Weights   04/26/17 1806  Weight: 99.8 kg (220 lb)    Examination:  General exam: Appears calm and comfortable  Respiratory system: Clear to auscultation. Respiratory effort normal. Cardiovascular system: S1 & S2 heard, RRR. No JVD, murmurs, rubs, gallops or clicks. No pedal edema. Gastrointestinal system: Abdomen is nondistended, soft decreased tenderness. No organomegaly or masses felt. Normal bowel sounds heard. Central nervous system: Alert and oriented. No focal neurological deficits. Extremities: Symmetric 5 x 5 power. Skin: No rashes, lesions or ulcers Psychiatry: Judgement and insight appear normal. Mood & affect appropriate.     Data Reviewed: I have personally reviewed following labs and imaging studies  CBC: Recent Labs  Lab 04/26/17 1856 04/26/17 2347 04/27/17 0303 04/28/17 0615  WBC 11.4* 10.7* 8.5 7.9  NEUTROABS  --   --   --  4.0  HGB 13.9 12.4 11.3* 10.2*  HCT 40.7 35.9* 34.2* 31.3*  MCV 89.1 89.5 90.0 90.7  PLT 206 223 199 212   Basic Metabolic Panel: Recent Labs  Lab  04/26/17 1856 04/26/17 2347 04/27/17 0303 04/28/17 0615  NA 136  --  136 135  K 3.7  --  3.3* 3.8  CL 99*  --  103 99*  CO2 27  --  23 26  GLUCOSE 93  --  98 106*  BUN 10  --  5* <5*  CREATININE 0.69 1.46* 0.57 0.67  CALCIUM 8.6*  --  7.6* 8.0*   GFR: Estimated Creatinine Clearance: 86.9 mL/min (by C-G formula based on SCr of 0.67 mg/dL). Liver Function Tests: Recent Labs  Lab 04/26/17 1856  AST 20  ALT 16  ALKPHOS 47  BILITOT 0.9  PROT 7.4  ALBUMIN 3.7   No results for input(s): LIPASE, AMYLASE in the last 168 hours. No results for input(s): AMMONIA in the last 168 hours. Coagulation Profile: No results for input(s): INR, PROTIME in the last 168 hours. Cardiac Enzymes: Recent Labs  Lab 04/26/17 1856 04/26/17 2347 04/27/17 0303  CKTOTAL 182  --   --   CKMB 1.9  --   --   TROPONINI <0.03 <0.03 <0.03   BNP (last 3 results) No results for input(s): PROBNP in the last 8760 hours. HbA1C: No results for input(s): HGBA1C in the last 72 hours. CBG: No results for input(s): GLUCAP in the last 168 hours. Lipid Profile: No results for input(s): CHOL, HDL, LDLCALC, TRIG, CHOLHDL, LDLDIRECT in the last 72 hours. Thyroid Function Tests: No results for input(s): TSH, T4TOTAL, FREET4, T3FREE, THYROIDAB in the last 72 hours. Anemia Panel: No results for input(s): VITAMINB12, FOLATE, FERRITIN, TIBC, IRON, RETICCTPCT in the last 72 hours. Sepsis Labs: Recent Labs  Lab 04/26/17 1856  PROCALCITON <0.10  No results found for this or any previous visit (from the past 240 hour(s)).       Radiology Studies: Ct Angio Chest/abd/pel For Dissection W And/or Wo Contrast  Result Date: 04/26/2017 CLINICAL DATA:  LEFT-sided chest pain abdominal pain EXAM: CT ANGIOGRAPHY CHEST, ABDOMEN AND PELVIS TECHNIQUE: Multidetector CT imaging through the chest, abdomen and pelvis was performed using the standard protocol during bolus administration of intravenous contrast. Multiplanar  reconstructed images and MIPs were obtained and reviewed to evaluate the vascular anatomy. CONTRAST:  ISOVUE-370 IOPAMIDOL (ISOVUE-370) INJECTION 76% COMPARISON:  None. FINDINGS: CTA CHEST FINDINGS Cardiovascular: None IV contrast images demonstrate no intramural hematoma within the thoracic aorta. Contrast images demonstrate no dissection or aneurysm the thoracic aorta. Coronary artery calcification and aortic atherosclerotic calcification. Additionally, there is no central pulmonary embolism. Mediastinum/Nodes: No axillary supraclavicular adenopathy. No mediastinal adenopathy. No pericardial effusion. Esophagus normal. Lungs/Pleura: No suspicious pulmonary nodularity. Linear atelectasis at the LEFT lung base Musculoskeletal: No aggressive osseous lesion Review of the MIP images confirms the above findings. CTA ABDOMEN AND PELVIS FINDINGS VASCULAR Aorta: Abdominal aorta is normal caliber. Mild intimal calcification. Celiac: Widely patent SMA: Widely patent Renals: Bilateral single renal arteries IMA: Patent Inflow: Normal Veins: Normal Review of the MIP images confirms the above findings. NON-VASCULAR Hepatobiliary: No focal hepatic lesion. No biliary duct dilatation. Gallbladder is normal. Common bile duct is normal. Pancreas: Pancreas is normal. No ductal dilatation. No pancreatic inflammation. Spleen: Normal spleen Adrenals/urinary tract: Adrenal glands and kidneys are normal. The ureters and bladder normal. Stomach/Bowel: Stomach, small bowel, appendix, and cecum are normal. There are diverticula descending colon. At the junction of the distal descending colon sigmoid colon there is a 5 cm segment of bowel inflammation and thickening. There is thickening along the peritoneal reflection at this level. There are multiple diverticular through this region. Findings are most consistent with acute diverticulitis of the proximal sigmoid colon. No perforation or abscess. Vascular/Lymphatic: Abdominal aorta is  normal caliber. No periportal or retroperitoneal adenopathy. No pelvic adenopathy. Reproductive: Uterus and ovaries normal Other: No free fluid. Musculoskeletal: No aggressive osseous lesion. Review of the MIP images confirms the above findings. IMPRESSION: Chest Impression: 1. No evidence of aortic dissection or aneurysm. 2. Mild basilar atelectasis. Abdomen / Pelvis Impression: 1. ACUTE DIVERTICULITIS of the proximal sigmoid colon. No abscess or macro perforation. 2. Intimal calcification of the abdominal aorta without evidence of dissection or aneurysm. Electronically Signed   By: Genevive Bi M.D.   On: 04/26/2017 21:14        Scheduled Meds: . aspirin  81 mg Oral Daily  . buPROPion  450 mg Oral Daily  . cholecalciferol  1,000 Units Oral Daily  . clonazePAM  1 mg Oral BID  . divalproex  1,000 mg Oral QHS  . escitalopram  20 mg Oral Daily  . famotidine  20 mg Oral BID  . heparin  5,000 Units Subcutaneous Q8H  . levothyroxine  137 mcg Oral QAC breakfast  . metoprolol tartrate  12.5 mg Oral BID  . multivitamin with minerals  1 tablet Oral QHS  . omega-3 acid ethyl esters  1 g Oral BID  . senna  1 tablet Oral BID  . sodium chloride flush  3 mL Intravenous Q12H  . traZODone  100 mg Oral QHS  . vitamin E  1,200 Units Oral Daily   Continuous Infusions: . sodium chloride    . dextrose 5 % and 0.45% NaCl 75 mL/hr at 04/28/17 0910  . piperacillin-tazobactam (ZOSYN)  IV Stopped (04/28/17 1129)     LOS: 2 days    Alwyn Ren, MD [facility] If 7PM-7AM, please contact night-coverage [email or web address] Password [email or web address] 04/28/2017, 1:35 PM

## 2017-04-29 LAB — URINE CULTURE
Culture: NO GROWTH
Special Requests: NORMAL

## 2017-04-29 MED ORDER — CIPROFLOXACIN HCL 500 MG PO TABS: 500.0000 mg | ORAL_TABLET | Freq: Two times a day (BID) | ORAL | 0 refills | Status: AC

## 2017-04-29 MED ORDER — METRONIDAZOLE 500 MG PO TABS: 500.0000 mg | ORAL_TABLET | Freq: Three times a day (TID) | ORAL | 0 refills | Status: AC

## 2017-04-29 MED ORDER — GI COCKTAIL ~~LOC~~
30.0000 mL | Freq: Once | ORAL | Status: AC
  Administered 2017-04-29: 30 mL via ORAL
  Filled 2017-04-29: qty 30

## 2017-04-29 MED ORDER — FAMOTIDINE 20 MG PO TABS: 20.0000 mg | ORAL_TABLET | Freq: Two times a day (BID) | ORAL | Status: DC

## 2017-04-29 NOTE — Care Management Note (Signed)
Case Management Note  Patient Details  Name: Latoya Clark MRN: [id number] Date of Birth: 09/02/1960  Subjective/Objective:                    Action/Plan: Pt discharged home with self care. Pt has PCP, she is currently without insurance but is in process of getting COBRA. CM provided her with medication coupon to assist with cost of d/c meds. Pt feels she can afford the meds with the assistance.  Pt has transportation home.   Expected Discharge Date:  04/29/17               Expected Discharge Plan:  Home/Self Care  In-House Referral:     Discharge planning Services  CM Consult, Medication Assistance  Post Acute Care Choice:    Choice offered to:     DME Arranged:    DME Agency:     HH Arranged:    HH Agency:     Status of Service:  Completed, signed off  If discussed at [employer] Length of Stay Meetings, dates discussed:    Additional Comments:  Kermit BaloKelli F Maysie Parkhill, RN 04/29/2017, 2:53 PM

## 2017-04-29 NOTE — Discharge Summary (Signed)
Physician Discharge Summary  Latoya Clark ZOX:[id number] DOB: Oct 17, 1960 DOA: 04/26/2017  PCP: Georgann Housekeeper, MD  Admit date: 04/26/2017 Discharge date: 04/29/2017  Admitted From:home Disposition:  home  Recommendations for Outpatient Follow-up:  1. Follow up with PCP in 1-2 weeks 2. Please obtain BMP/CBC in one week  Home Health none Equipment/Devices:none Discharge Condition:stable CODE STATUS:full Diet recommendation: regular  Brief/Interim Summary:57 y.o.femalewith past medical history relevant for hypothyroidism, hypertension, obesity, as well as history of IBS and recurrent diverticulitis who presents with complaints of left lower quadrant for about 3 days now today she developed epigastric area discomfort.Patient tells me she has a history of recurrent diverticulitis,has had multiple colonoscopies in the past.She denies dysuria  Patient initially went to [facility] as her pain was pretty low downin her abdomen,while at [facility] she complained of left lower quadrant pain as well as epigastric/chest discomfort, EKG was done she was sent over here for further evaluation.Chest discomfort was associated with nausea but no palpitations no leg pains no pleuritic symptoms,no diaphoresis it radiated to her left shoulder.  At the time of my evaluation patient states the chest pain is subsided, she complains mostly of the left lower quadrant pain with some nausea but no emesis, last bowel movement was semisolidw/oblood or mucus...Marland KitchenMarland KitchenPatient gives a history of frequent bowel problems due to"IBS",[facility]  No fever Or chills ,   In ED--- creatinine is now 1.46 from 0.7 a few hrs ago, pt had CTA chest/abd/pelvis on 04/26/17, IV fluid boluses ordered,UA suspicious for UTI, cultures ordered  CT abdomen and pelvis as noted below  CT Abd/pelvis IMPRESSION:  Chest Impression: 1. No evidence of aortic dissection or aneurysm. 2.  Mild basilar atelectasis.  Abdomen/Pelvis Impression:  1. ACUTE DIVERTICULITIS of the proximal sigmoid colon. No abscess or macro perforation. 2. Intimal calcification of the abdominal aorta without evidence of dissection or aneurysm.      Discharge Diagnoses:  Principal Problem:   Diverticulitis large intestine Active Problems:   Essential hypertension   Current smoker   Anxiety and depression   AKI (acute kidney injury) (HCC)  1]acute proximal sigmoid diverticulitis-patient admitted with symptoms of abdominal pain nausea and vomiting.  She was found to have sigmoid diverticulitis by CT scan.  She was treated with Zosyn and Flagyl.  And IV fluids and was kept n.p.o. her symptoms improved with the above treatment.  On the day of discharge she was able to tolerate regular diet.  She follows up with Dr. Levora Angel as an outpatient.  I will discharge her today on Cipro and Flagyl for another 10 days.  2] atypical chest pain patient did have complaints of pleuritic atypical chest pain during this hospital stay.  Her vital signs remained stable cardiac enzymes and EKG did not she reveal any acute changes.  Cardiac enzymes were negative.  At some point she will need to follow-up with the cardiologist if symptoms does come back.  At this time she is free of chest pain.  3] hypertension-re start losartan.  4] depression and anxiety continue home dose of Klonopin bupropion Lexapro Depakote and trazodone.  5] hypothyroidism continue Synthroid.  Discharge Instructions  Discharge Instructions    Call MD for:  difficulty breathing, headache or visual disturbances   Complete by:  As directed    Call MD for:  persistant dizziness or light-headedness   Complete by:  As directed    Call MD for:  persistant nausea and vomiting   Complete by:  As directed  Call MD for:  redness, tenderness, or signs of infection (pain, swelling, redness, odor or green/yellow discharge around incision site)    Complete by:  As directed    Call MD for:  severe uncontrolled pain   Complete by:  As directed    Diet - low sodium heart healthy   Complete by:  As directed    Increase activity slowly   Complete by:  As directed      Allergies as of 04/29/2017      Reactions   Morphine And Related Other (See Comments)   Headaches   Prednisone Other (See Comments)   Widespread pain throughout body   Codeine Other (See Comments)   Must eat if taking codeine or pre-treat with a med for nausea      Medication List    STOP taking these medications   amoxicillin 500 MG capsule Commonly known as:  AMOXIL   ibuprofen 800 MG tablet Commonly known as:  ADVIL,MOTRIN   ranitidine 150 MG tablet Commonly known as:  ZANTAC Replaced by:  famotidine 20 MG tablet   VITAMIN E-400 400 UNIT capsule Generic drug:  vitamin E     TAKE these medications   buPROPion 150 MG 24 hr tablet Commonly known as:  WELLBUTRIN XL Take 450 mg by mouth every morning.   cholecalciferol 1000 units tablet Commonly known as:  VITAMIN D Take 1,000 Units by mouth daily.   ciprofloxacin 500 MG tablet Commonly known as:  CIPRO Take 1 tablet (500 mg total) by mouth 2 (two) times daily for 10 days.   clonazePAM 1 MG tablet Commonly known as:  KLONOPIN Take 1 mg by mouth 2 (two) times daily.   divalproex 500 MG 24 hr tablet Commonly known as:  DEPAKOTE ER Take 1,000 mg by mouth at bedtime.   escitalopram 20 MG tablet Commonly known as:  LEXAPRO Take 20 mg by mouth daily.   famotidine 20 MG tablet Commonly known as:  PEPCID Take 1 tablet (20 mg total) by mouth 2 (two) times daily. Replaces:  ranitidine 150 MG tablet   FISH OIL PO Take 1 capsule by mouth at bedtime.   losartan 50 MG tablet Commonly known as:  COZAAR Take 50 mg by mouth daily.   metroNIDAZOLE 500 MG tablet Commonly known as:  FLAGYL Take 1 tablet (500 mg total) by mouth 3 (three) times daily for 10 days.   multivitamin tablet Take 1  tablet by mouth at bedtime.   SYNTHROID 137 MCG tablet Generic drug:  levothyroxine Take 137 mcg by mouth daily.   traZODone 100 MG tablet Commonly known as:  DESYREL Take 100 mg by mouth at bedtime.      Follow-up Information    Georgann Housekeeper, MD Follow up.   Specialty:  Internal Medicine Contact information: [address] [place] [address] [phone]          Allergies  Allergen Reactions  . Morphine And Related Other (See Comments)    Headaches   . Prednisone Other (See Comments)    Widespread pain throughout body  . Codeine Other (See Comments)    Must eat if taking codeine or pre-treat with a med for nausea    Consultations:  none   Procedures/Studies: Ct Angio Chest/abd/pel For Dissection W And/or Wo Contrast  Result Date: 04/26/2017 CLINICAL DATA:  LEFT-sided chest pain abdominal pain EXAM: CT ANGIOGRAPHY CHEST, ABDOMEN AND PELVIS TECHNIQUE: Multidetector CT imaging through the chest, abdomen and pelvis was performed  using the standard protocol during bolus administration of intravenous contrast. Multiplanar reconstructed images and MIPs were obtained and reviewed to evaluate the vascular anatomy. CONTRAST:  ISOVUE-370 IOPAMIDOL (ISOVUE-370) INJECTION 76% COMPARISON:  None. FINDINGS: CTA CHEST FINDINGS Cardiovascular: None IV contrast images demonstrate no intramural hematoma within the thoracic aorta. Contrast images demonstrate no dissection or aneurysm the thoracic aorta. Coronary artery calcification and aortic atherosclerotic calcification. Additionally, there is no central pulmonary embolism. Mediastinum/Nodes: No axillary supraclavicular adenopathy. No mediastinal adenopathy. No pericardial effusion. Esophagus normal. Lungs/Pleura: No suspicious pulmonary nodularity. Linear atelectasis at the LEFT lung base Musculoskeletal: No aggressive osseous lesion Review of the MIP images confirms the above findings. CTA ABDOMEN AND PELVIS  FINDINGS VASCULAR Aorta: Abdominal aorta is normal caliber. Mild intimal calcification. Celiac: Widely patent SMA: Widely patent Renals: Bilateral single renal arteries IMA: Patent Inflow: Normal Veins: Normal Review of the MIP images confirms the above findings. NON-VASCULAR Hepatobiliary: No focal hepatic lesion. No biliary duct dilatation. Gallbladder is normal. Common bile duct is normal. Pancreas: Pancreas is normal. No ductal dilatation. No pancreatic inflammation. Spleen: Normal spleen Adrenals/urinary tract: Adrenal glands and kidneys are normal. The ureters and bladder normal. Stomach/Bowel: Stomach, small bowel, appendix, and cecum are normal. There are diverticula descending colon. At the junction of the distal descending colon sigmoid colon there is a 5 cm segment of bowel inflammation and thickening. There is thickening along the peritoneal reflection at this level. There are multiple diverticular through this region. Findings are most consistent with acute diverticulitis of the proximal sigmoid colon. No perforation or abscess. Vascular/Lymphatic: Abdominal aorta is normal caliber. No periportal or retroperitoneal adenopathy. No pelvic adenopathy. Reproductive: Uterus and ovaries normal Other: No free fluid. Musculoskeletal: No aggressive osseous lesion. Review of the MIP images confirms the above findings. IMPRESSION: Chest Impression: 1. No evidence of aortic dissection or aneurysm. 2. Mild basilar atelectasis. Abdomen / Pelvis Impression: 1. ACUTE DIVERTICULITIS of the proximal sigmoid colon. No abscess or macro perforation. 2. Intimal calcification of the abdominal aorta without evidence of dissection or aneurysm. Electronically Signed   By: Genevive Bi M.D.   On: 04/26/2017 21:14    (Echo, Carotid, EGD, Colonoscopy, ERCP)    Subjective:   Discharge Exam: Vitals:   04/28/17 2134 04/29/17 0500  BP: (!) 133/92 129/89  Pulse: 69 67  Resp: 19 18  Temp: 98.9 F (37.2 C) 98 F (36.7  C)  SpO2: 91% 94%   Vitals:   04/28/17 0556 04/28/17 1418 04/28/17 2134 04/29/17 0500  BP: 120/83 (!) 143/93 (!) 133/92 129/89  Pulse: 71 67 69 67  Resp: 18 16 19 18   Temp: 98.7 F (37.1 C) 97.6 F (36.4 C) 98.9 F (37.2 C) 98 F (36.7 C)  TempSrc: Oral Oral Oral Oral  SpO2: 94% 92% 91% 94%  Weight:      Height:        General: Pt is alert, awake, not in acute distress Cardiovascular: RRR, S1/S2 +, no rubs, no gallops Respiratory: CTA bilaterally, no wheezing, no rhonchi Abdominal: Soft, NT, ND, bowel sounds + Extremities: no edema, no cyanosis    The results of significant diagnostics from this hospitalization (including imaging, microbiology, ancillary and laboratory) are listed below for reference.     Microbiology: Recent Results (from the past 240 hour(s))  Urine Culture     Status: None   Collection Time: 04/27/17  1:39 AM  Result Value Ref Range Status   Specimen Description URINE, CATHETERIZED  Final   Special Requests Normal  Final   Culture   Final    NO GROWTH Performed at [facility], [address], [place], [place] [address]    Report Status 04/29/2017 FINAL  Final  Culture, blood (Routine X 2) w Reflex to ID Panel     Status: None (Preliminary result)   Collection Time: 04/27/17  2:41 AM  Result Value Ref Range Status   Specimen Description BLOOD LEFT ANTECUBITAL  Final   Special Requests   Final    BOTTLES DRAWN AEROBIC AND ANAEROBIC Blood Culture results may not be optimal due to an excessive volume of blood received in culture bottles   Culture   Final    NO GROWTH 1 DAY Performed at [facility], [address], [place], [place] [address]    Report Status PENDING  Incomplete  Culture, blood (Routine X 2) w Reflex to ID Panel     Status: None (Preliminary result)   Collection Time: 04/27/17  2:46 AM  Result Value Ref Range Status   Specimen Description BLOOD RIGHT ANTECUBITAL  Final   Special Requests   Final    BOTTLES DRAWN  AEROBIC AND ANAEROBIC Blood Culture adequate volume   Culture   Final    NO GROWTH 1 DAY Performed at [facility], [address], [place], [place] [address]    Report Status PENDING  Incomplete     Labs: BNP (last 3 results) No results for input(s): BNP in the last 8760 hours. Basic Metabolic Panel: Recent Labs  Lab 04/26/17 1856 04/26/17 2347 04/27/17 0303 04/28/17 0615  NA 136  --  136 135  K 3.7  --  3.3* 3.8  CL 99*  --  103 99*  CO2 27  --  23 26  GLUCOSE 93  --  98 106*  BUN 10  --  5* <5*  CREATININE 0.69 1.46* 0.57 0.67  CALCIUM 8.6*  --  7.6* 8.0*   Liver Function Tests: Recent Labs  Lab 04/26/17 1856  AST 20  ALT 16  ALKPHOS 47  BILITOT 0.9  PROT 7.4  ALBUMIN 3.7   No results for input(s): LIPASE, AMYLASE in the last 168 hours. No results for input(s): AMMONIA in the last 168 hours. CBC: Recent Labs  Lab 04/26/17 1856 04/26/17 2347 04/27/17 0303 04/28/17 0615  WBC 11.4* 10.7* 8.5 7.9  NEUTROABS  --   --   --  4.0  HGB 13.9 12.4 11.3* 10.2*  HCT 40.7 35.9* 34.2* 31.3*  MCV 89.1 89.5 90.0 90.7  PLT 206 223 199 212   Cardiac Enzymes: Recent Labs  Lab 04/26/17 1856 04/26/17 2347 04/27/17 0303  CKTOTAL 182  --   --   CKMB 1.9  --   --   TROPONINI <0.03 <0.03 <0.03   BNP: Invalid input(s): POCBNP CBG: No results for input(s): GLUCAP in the last 168 hours. D-Dimer No results for input(s): DDIMER in the last 72 hours. Hgb A1c No results for input(s): HGBA1C in the last 72 hours. Lipid Profile No results for input(s): CHOL, HDL, LDLCALC, TRIG, CHOLHDL, LDLDIRECT in the last 72 hours. Thyroid function studies No results for input(s): TSH, T4TOTAL, T3FREE, THYROIDAB in the last 72 hours.  Invalid input(s): FREET3 Anemia work up No results for input(s): VITAMINB12, FOLATE, FERRITIN, TIBC, IRON, RETICCTPCT in the last 72 hours. Urinalysis    Component Value Date/Time   COLORURINE YELLOW 04/26/2017 1808   APPEARANCEUR HAZY (A)  04/26/2017 1808   LABSPEC 1.019 04/26/2017  1808   PHURINE 7.0 04/26/2017 1808   GLUCOSEU NEGATIVE 04/26/2017 1808   HGBUR NEGATIVE 04/26/2017 1808   HGBUR negative 06/08/2008 0846   BILIRUBINUR NEGATIVE 04/26/2017 1808   KETONESUR 20 (A) 04/26/2017 1808   PROTEINUR NEGATIVE 04/26/2017 1808   UROBILINOGEN 0.2 06/24/2013 0917   NITRITE NEGATIVE 04/26/2017 1808   LEUKOCYTESUR TRACE (A) 04/26/2017 1808   Sepsis Labs Invalid input(s): PROCALCITONIN,  WBC,  LACTICIDVEN Microbiology Recent Results (from the past 240 hour(s))  Urine Culture     Status: None   Collection Time: 04/27/17  1:39 AM  Result Value Ref Range Status   Specimen Description URINE, CATHETERIZED  Final   Special Requests Normal  Final   Culture   Final    NO GROWTH Performed at [facility], [address] [place], [place] [address]    Report Status 04/29/2017 FINAL  Final  Culture, blood (Routine X 2) w Reflex to ID Panel     Status: None (Preliminary result)   Collection Time: 04/27/17  2:41 AM  Result Value Ref Range Status   Specimen Description BLOOD LEFT ANTECUBITAL  Final   Special Requests   Final    BOTTLES DRAWN AEROBIC AND ANAEROBIC Blood Culture results may not be optimal due to an excessive volume of blood received in culture bottles   Culture   Final    NO GROWTH 1 DAY Performed at [facility], [address] [place], [place] [address]    Report Status PENDING  Incomplete  Culture, blood (Routine X 2) w Reflex to ID Panel     Status: None (Preliminary result)   Collection Time: 04/27/17  2:46 AM  Result Value Ref Range Status   Specimen Description BLOOD RIGHT ANTECUBITAL  Final   Special Requests   Final    BOTTLES DRAWN AEROBIC AND ANAEROBIC Blood Culture adequate volume   Culture   Final    NO GROWTH 1 DAY Performed at [facility], [address] [place], [place] [address]    Report Status PENDING  Incomplete     Time coordinating discharge: Over 30  minutes  SIGNED:   Alwyn RenElizabeth G Mathews, MD  [facility] 04/29/2017, 10:08 AM Pager   If 7PM-7AM, please contact night-coverage [email or web address] Password [email or web address]

## 2017-05-02 LAB — CULTURE, BLOOD (ROUTINE X 2)
CULTURE: NO GROWTH
Culture: NO GROWTH
SPECIAL REQUESTS: ADEQUATE

## 2017-11-20 ENCOUNTER — Ambulatory Visit: Payer: 59 | Admitting: Women's Health

## 2017-11-20 ENCOUNTER — Ambulatory Visit (INDEPENDENT_AMBULATORY_CARE_PROVIDER_SITE_OTHER): Payer: 59

## 2017-11-20 VITALS — BP 150/94 | Wt 229.0 lb

## 2017-11-20 DIAGNOSIS — R1012 Left upper quadrant pain: Secondary | ICD-10-CM | POA: Diagnosis not present

## 2017-11-20 MED ORDER — CIPROFLOXACIN HCL 500 MG PO TABS: 500.0000 mg | ORAL_TABLET | Freq: Two times a day (BID) | ORAL | 0 refills | Status: DC

## 2017-11-20 MED ORDER — FLUCONAZOLE 150 MG PO TABS: 150.0000 mg | ORAL_TABLET | Freq: Once | ORAL | 0 refills | Status: AC

## 2017-11-20 MED ORDER — METRONIDAZOLE 500 MG PO TABS: 500.0000 mg | ORAL_TABLET | Freq: Two times a day (BID) | ORAL | 0 refills | Status: DC

## 2017-11-20 NOTE — Progress Notes (Signed)
57 year old MWF G2 P2 presents with complaint of left lower quadrant pain that has increased over the past several days.  Was hospitalized for diverticulitis 04/26/2017.  Pain similar in similar region.  States did call GI doctor but was told to go to the ER, office visit 12/27/17.  States wanted to make sure it was not ovarian in nature, states cannot afford to go back to the ER owes thousands on last bill.  Reports pain is about an 8, mild nausea has not vomited.  One loose stool today.  Medical problems include hypertension  IBS, GERD, anxiety and depression.  Postmenopausal on no HRT with no bleeding..  Denies vaginal discharge, urinary symptoms, or  fever.  Exam: Appears uncomfortable.  Abdomen obese, soft, bowel sounds present in all 4 quadrants, pain with deep palpation especially on left lower quadrant without rebound. Ultrasound: T/V uterus anteverted heterogeneous intramural fibroid 10 mm.  Endometrium within normal limits 2.9 mm.  Right left ovary normal.  Negative cul-de-sac.  No apparent mass right or left adnexal.  Probable diverticulitis  Plan: Reviewed best to follow-up with GI.  Cipro 500 p.o. twice daily for 7 days, Flagyl 500 mg p.o. twice daily for 7 days Diflucan 151 dose if needed for yeast symptoms.  Reviewed to follow-up in the ER if no pain relief, vomiting or symptoms/pain increase.  Keep scheduled 12/20 office visit with GI.  Blood pressure elevated, recheck at home if persists greater than 130/80 follow-up with primary care.  Reviewed likely elevated now due to pain.

## 2017-11-20 NOTE — Patient Instructions (Signed)
Diverticulosis  Diverticulosis is a condition that develops when small pouches (diverticula) form in the wall of the large intestine (colon). The colon is where water is absorbed and stool is formed. The pouches form when the inside layer of the colon pushes through weak spots in the outer layers of the colon. You may have a few pouches or many of them.  What are the causes?  The cause of this condition is not known.  What increases the risk?  The following factors may make you more likely to develop this condition:   Being older than age 60. Your risk for this condition increases with age. Diverticulosis is rare among people younger than age 30. By age 80, many people have it.   Eating a low-fiber diet.   Having frequent constipation.   Being overweight.   Not getting enough exercise.   Smoking.   Taking over-the-counter pain medicines, like aspirin and ibuprofen.   Having a family history of diverticulosis.    What are the signs or symptoms?  In most people, there are no symptoms of this condition. If you do have symptoms, they may include:   Bloating.   Cramps in the abdomen.   Constipation or diarrhea.   Pain in the lower left side of the abdomen.    How is this diagnosed?  This condition is most often diagnosed during an exam for other colon problems. Because diverticulosis usually has no symptoms, it often cannot be diagnosed independently. This condition may be diagnosed by:   Using a flexible scope to examine the colon (colonoscopy).   Taking an X-ray of the colon after dye has been put into the colon (barium enema).   Doing a CT scan.    How is this treated?  You may not need treatment for this condition if you have never developed an infection related to diverticulosis. If you have had an infection before, treatment may include:   Eating a high-fiber diet. This may include eating more fruits, vegetables, and grains.   Taking a fiber supplement.   Taking a live bacteria supplement  (probiotic).   Taking medicine to relax your colon.   Taking antibiotic medicines.    Follow these instructions at home:   Drink 6-8 glasses of water or more each day to prevent constipation.   Try not to strain when you have a bowel movement.   If you have had an infection before:  ? Eat more fiber as directed by your health care provider or your diet and nutrition specialist (dietitian).  ? Take a fiber supplement or probiotic, if your health care provider approves.   Take over-the-counter and prescription medicines only as told by your health care provider.   If you were prescribed an antibiotic, take it as told by your health care provider. Do not stop taking the antibiotic even if you start to feel better.   Keep all follow-up visits as told by your health care provider. This is important.  Contact a health care provider if:   You have pain in your abdomen.   You have bloating.   You have cramps.   You have not had a bowel movement in 3 days.  Get help right away if:   Your pain gets worse.   Your bloating becomes very bad.   You have a fever or chills, and your symptoms suddenly get worse.   You vomit.   You have bowel movements that are bloody or black.   You have   bleeding from your rectum.  Summary   Diverticulosis is a condition that develops when small pouches (diverticula) form in the wall of the large intestine (colon).   You may have a few pouches or many of them.   This condition is most often diagnosed during an exam for other colon problems.   If you have had an infection related to diverticulosis, treatment may include increasing the fiber in your diet, taking supplements, or taking medicines.  This information is not intended to replace advice given to you by your health care provider. Make sure you discuss any questions you have with your health care provider.  Document Released: 09/22/2003 Document Revised: 11/14/2015 Document Reviewed: 11/14/2015  [employer]vier Interactive  Patient Education  2017 [employer]

## 2018-02-20 ENCOUNTER — Ambulatory Visit: Payer: 59 | Admitting: Physician Assistant

## 2018-02-20 ENCOUNTER — Encounter: Payer: Self-pay | Admitting: Physician Assistant

## 2018-02-20 DIAGNOSIS — F319 Bipolar disorder, unspecified: Secondary | ICD-10-CM | POA: Diagnosis not present

## 2018-02-20 DIAGNOSIS — G4700 Insomnia, unspecified: Secondary | ICD-10-CM | POA: Diagnosis not present

## 2018-02-20 DIAGNOSIS — F411 Generalized anxiety disorder: Secondary | ICD-10-CM | POA: Diagnosis not present

## 2018-02-20 DIAGNOSIS — Z79899 Other long term (current) drug therapy: Secondary | ICD-10-CM

## 2018-02-20 MED ORDER — BUPROPION HCL ER (XL) 150 MG PO TB24: 450.0000 mg | ORAL_TABLET | ORAL | 5 refills | Status: DC

## 2018-02-20 MED ORDER — CLONAZEPAM 1 MG PO TABS: 1.0000 mg | ORAL_TABLET | Freq: Two times a day (BID) | ORAL | 5 refills | Status: DC | PRN

## 2018-02-20 MED ORDER — DIVALPROEX SODIUM ER 500 MG PO TB24: 1000.0000 mg | ORAL_TABLET | Freq: Every day | ORAL | 5 refills | Status: DC

## 2018-02-20 MED ORDER — TRAZODONE HCL 100 MG PO TABS: 100.0000 mg | ORAL_TABLET | Freq: Every evening | ORAL | 5 refills | Status: DC | PRN

## 2018-02-20 MED ORDER — ESCITALOPRAM OXALATE 20 MG PO TABS: 20.0000 mg | ORAL_TABLET | Freq: Every day | ORAL | 5 refills | Status: DC

## 2018-02-20 NOTE — Progress Notes (Signed)
[facility]  Patient ID: Latoya LeydenJulie C Griffy,  MRN: [id number]  PCP: Georgann HousekeeperHusain, Karrar, MD  Date of Evaluation: 02/20/18 Time spent:15 minutes  Chief Complaint:  Chief Complaint    Follow-up      HISTORY/CURRENT STATUS: HPI Not sleeping well.  Has her days and nights mixed up.  States her husband is only working part-time and she does not want to be around him so tends to sleep all day.  Patient denies loss of interest in usual activities and is able to enjoy things.  Denies decreased energy or motivation.  Appetite has not changed.  No extreme sadness, tearfulness, or feelings of hopelessness.  Denies any changes in concentration, making decisions or remembering things.  Denies suicidal or homicidal thoughts.  States she never feels extremely happy but knows a lot of that is her circumstances.  She still has anxiety to deal with on most days.  The Klonopin does help when she takes it.  She does get palpitations at times when she is thinking about something but is always triggered and goes away with the Klonopin or when she is able to relax.  Denies shortness of breath, chest pain or tightness, sweaty palms or other physical symptoms.  Individual Medical History/ Review of Systems: Changes? :No    Past medications for mental health diagnoses include: VPA, Lexapro, Wellbutrin, Traz, Klonopin, Ativan, Xanax  Allergies: Morphine and related; Prednisone; and Codeine  Current Medications:  Current Outpatient Medications:  .  buPROPion (WELLBUTRIN XL) 150 MG 24 hr tablet, Take 3 tablets (450 mg total) by mouth every morning., Disp: 90 tablet, Rfl: 5 .  clonazePAM (KLONOPIN) 1 MG tablet, Take 1 tablet (1 mg total) by mouth 2 (two) times daily as needed for anxiety., Disp: 60 tablet, Rfl: 5 .  divalproex (DEPAKOTE ER) 500 MG 24 hr tablet, Take 2 tablets (1,000 mg total) by mouth at bedtime., Disp: 60 tablet, Rfl: 5 .  escitalopram (LEXAPRO) 20 MG tablet, Take 1 tablet (20 mg total)  by mouth daily., Disp: 30 tablet, Rfl: 5 .  losartan (COZAAR) 50 MG tablet, Take 50 mg by mouth daily., Disp: , Rfl: 11 .  Multiple Vitamin (MULTIVITAMIN) tablet, Take 1 tablet by mouth at bedtime. , Disp: , Rfl:  .  SYNTHROID 137 MCG tablet, Take 137 mcg by mouth daily., Disp: , Rfl: 6 .  traZODone (DESYREL) 100 MG tablet, Take 1-2 tablets (100-200 mg total) by mouth at bedtime as needed for sleep., Disp: 60 tablet, Rfl: 5 .  cholecalciferol (VITAMIN D) 1000 units tablet, Take 1,000 Units by mouth daily. , Disp: , Rfl:  .  ciprofloxacin (CIPRO) 500 MG tablet, Take 1 tablet (500 mg total) by mouth 2 (two) times daily., Disp: 14 tablet, Rfl: 0 .  famotidine (PEPCID) 20 MG tablet, Take 1 tablet (20 mg total) by mouth 2 (two) times daily. (Patient not taking: Reported on 02/20/2018), Disp: , Rfl:  .  metroNIDAZOLE (FLAGYL) 500 MG tablet, Take 1 tablet (500 mg total) by mouth 2 (two) times daily., Disp: 14 tablet, Rfl: 0 .  Omega-3 Fatty Acids (FISH OIL PO), Take 1 capsule by mouth at bedtime. , Disp: , Rfl:  Medication Side Effects: none  Family Medical/ Social History: Changes? No  MENTAL HEALTH EXAM:  There were no vitals taken for this visit.There is no height or weight on file to calculate BMI.  General Appearance: Casual and Well Groomed  Eye Contact:  Good  Speech:  Clear and Coherent  Volume:  Normal  Mood:  Euthymic  Affect:  Appropriate  Thought Process:  Goal Directed  Orientation:  Full (Time, Place, and Person)  Thought Content: Logical   Suicidal Thoughts:  No  Homicidal Thoughts:  No  Memory:  WNL  Judgement:  Good  Insight:  Good  Psychomotor Activity:  Normal  Concentration:  Concentration: Good  Recall:  Good  Fund of Knowledge: Good  Language: Good  Assets:  Desire for Improvement  ADL's:  Intact  Cognition: WNL  Prognosis:  Good  Labs see on chart from April 28, 2017   DIAGNOSES:    ICD-10-CM   1. Insomnia, unspecified type G47.00   2. Generalized anxiety  disorder F41.1   3. Bipolar I disorder (HCC) F31.9   4. Encounter for long-term (current) use of medications Z79.899 Valproic acid level    Receiving Psychotherapy: No    RECOMMENDATIONS: Continue Wellbutrin XL 450 mg every morning. Continue Klonopin 1 mg twice daily as needed Continue Depakote ER 1000 mg nightly. Continue Lexapro 20 mg daily. Continue trazodone.  I encouraged her to increase to 200 mg from the 100 that she has been taking. Sleep hygiene was discussed.  Amber colored glasses were recommended.  Self-discipline was also discussed concerning her sleep time. Obtain Depakote level and she will get other labs drawn in the next few months when she has her regular physical in April. Return in 6 months or sooner as needed.  Melony Overly, PA-C

## 2018-03-23 IMAGING — US US RENAL
1 series · 14 of 25 positions shown · non-contrast
Comparison: CT 12/06/2015.

CLINICAL DATA: Left flank pain.

EXAM:
RENAL / URINARY TRACT ULTRASOUND COMPLETE

[Series 1: us renal · 0.23mm/px · 14 of 34 slices shown]
[im 1/34]
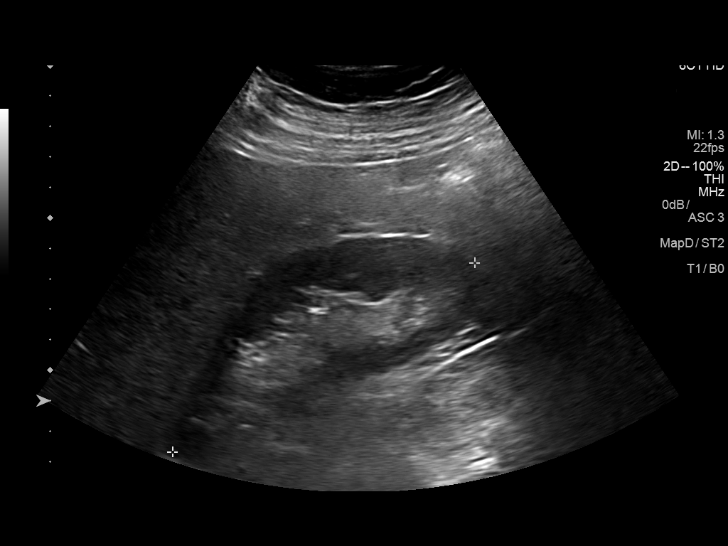
[im 3/34]
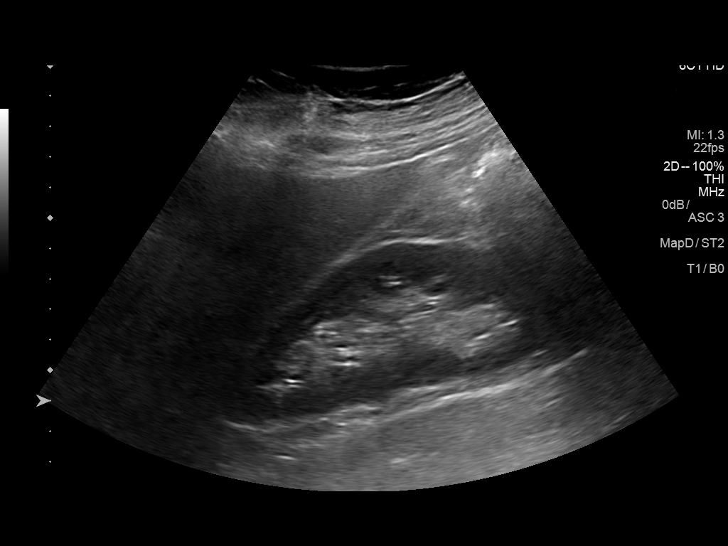
[im 6/34]
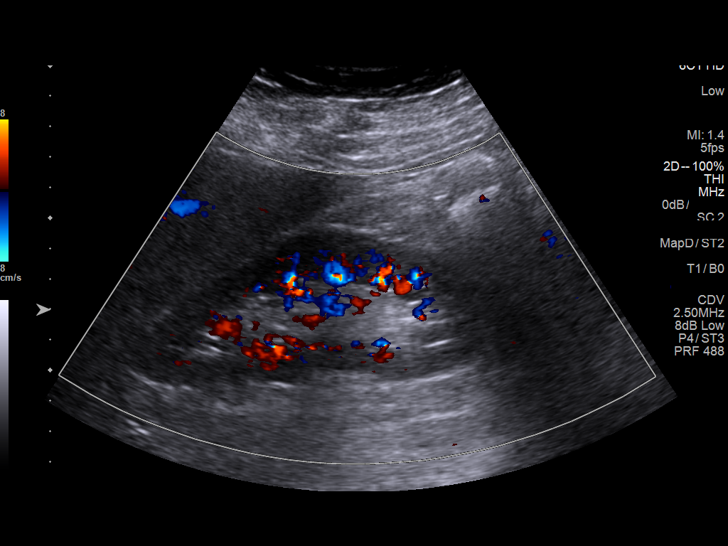
[im 9/34]
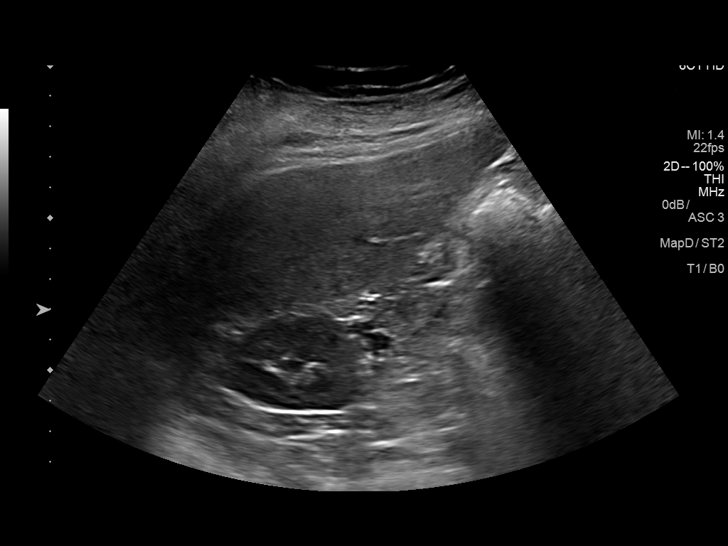
[im 12/34]
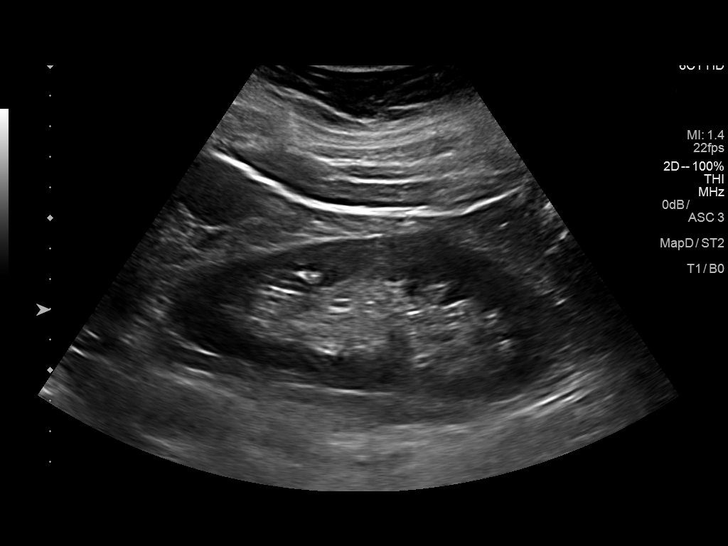
[im 13/34]
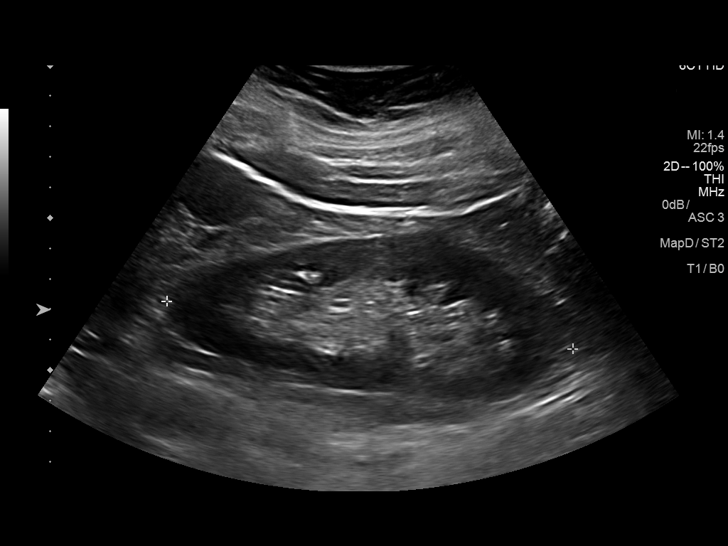
[im 16/34]
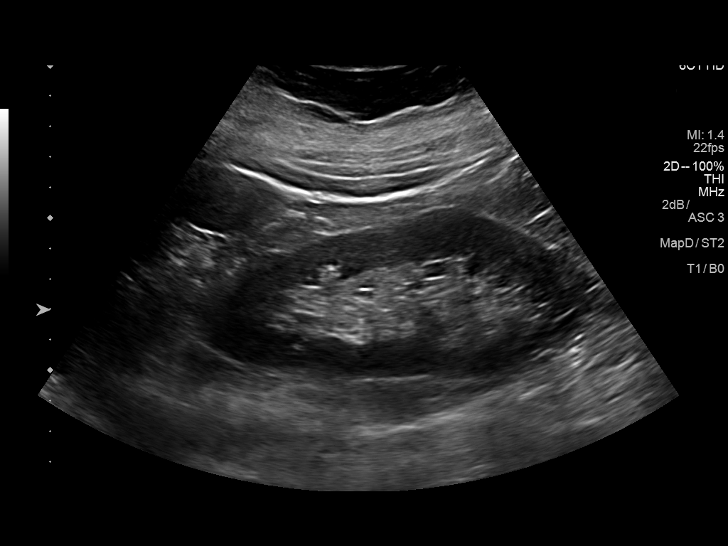
[im 18/34]
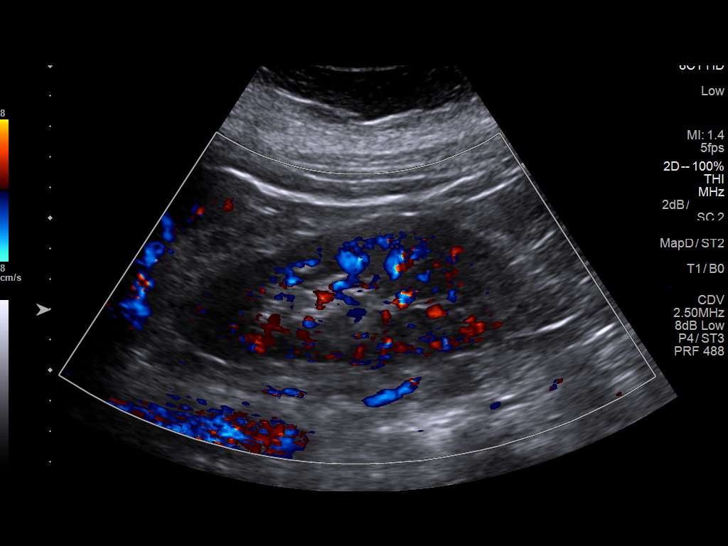
[im 21/34]
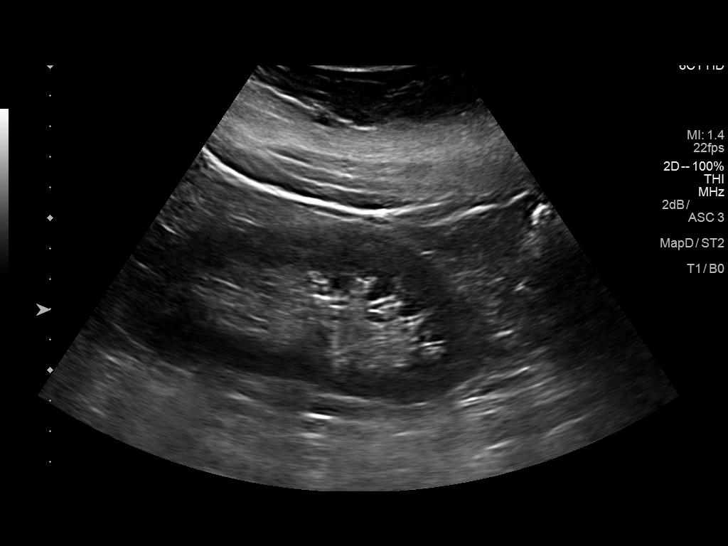
[im 23/34]
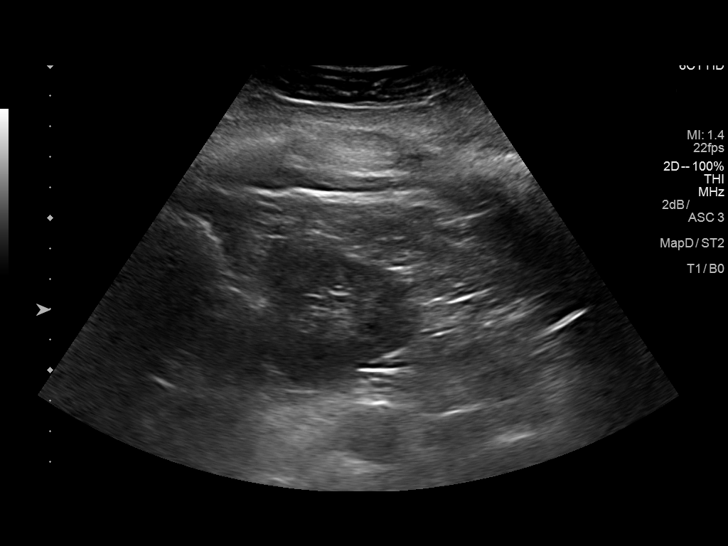
[im 25/34]
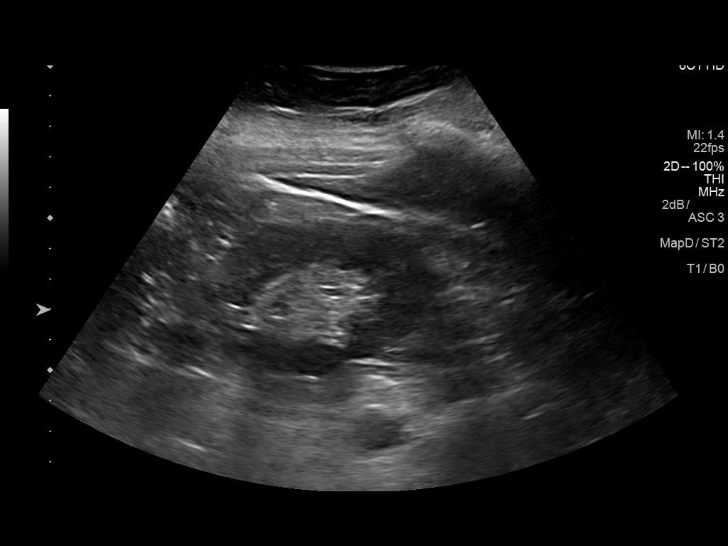
[im 28/34]
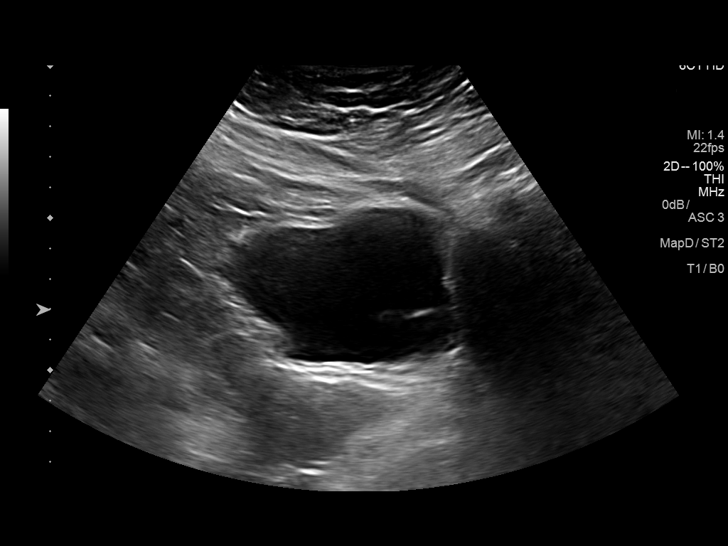
[im 31/34]
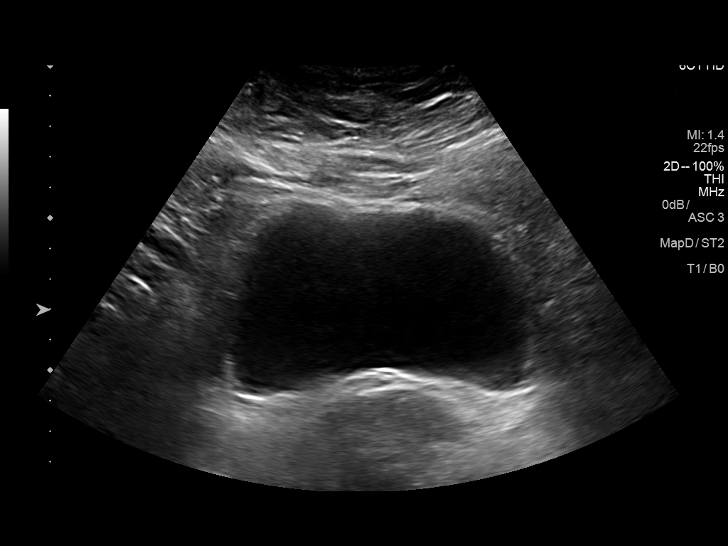
[im 34/34]
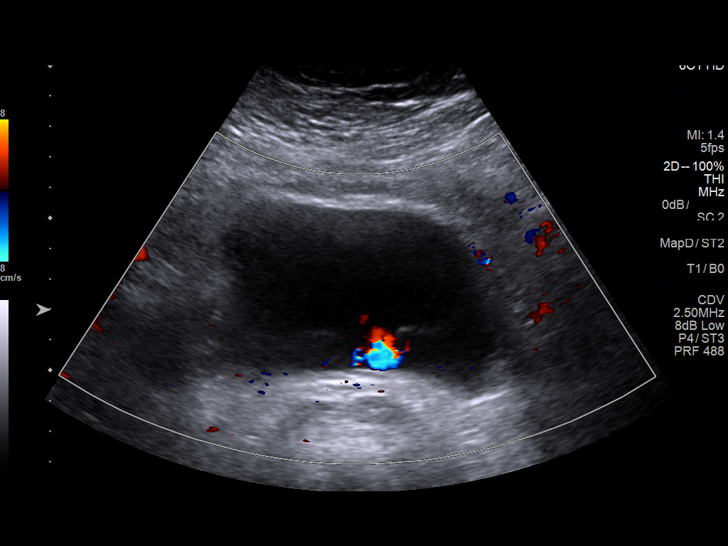

[14 of 25 positions shown; findings below may reference images not displayed]

FINDINGS: Right Kidney:

Length: 11.7 cm. Echogenicity within normal limits. No mass or
hydronephrosis visualized.

Left Kidney:

Length: 13.4 cm. Echogenicity within normal limits. No mass or
hydronephrosis visualized.

Bladder:

Appears normal for degree of bladder distention.
IMPRESSION: Negative exam.

## 2018-08-21 ENCOUNTER — Ambulatory Visit: Payer: 59 | Admitting: Physician Assistant

## 2018-09-28 ENCOUNTER — Other Ambulatory Visit: Payer: Self-pay | Admitting: Physician Assistant

## 2018-10-06 ENCOUNTER — Other Ambulatory Visit: Payer: Self-pay | Admitting: Physician Assistant

## 2018-10-06 NOTE — Telephone Encounter (Signed)
Still hasn't followed up since 02/2018

## 2018-10-07 ENCOUNTER — Encounter: Payer: Self-pay | Admitting: Gynecology

## 2018-11-03 ENCOUNTER — Other Ambulatory Visit: Payer: Self-pay | Admitting: Physician Assistant

## 2018-11-03 NOTE — Telephone Encounter (Signed)
Still no appt scheduled, last visit 02/2018

## 2018-11-03 NOTE — Telephone Encounter (Signed)
Needs appt

## 2018-11-28 ENCOUNTER — Telehealth: Payer: Self-pay | Admitting: Physician Assistant

## 2018-11-28 ENCOUNTER — Other Ambulatory Visit: Payer: Self-pay

## 2018-11-28 MED ORDER — ESCITALOPRAM OXALATE 20 MG PO TABS: 20.0000 mg | ORAL_TABLET | Freq: Every day | ORAL | 1 refills | Status: DC

## 2018-11-28 NOTE — Telephone Encounter (Signed)
Refill submitted. 

## 2018-11-28 NOTE — Telephone Encounter (Signed)
Patient called and made an appt for 12/22. She needs a refill on her lexapro she is completely out.  Please send it to [facility] on [address]

## 2018-12-09 ENCOUNTER — Other Ambulatory Visit: Payer: Self-pay | Admitting: Physician Assistant

## 2018-12-30 ENCOUNTER — Ambulatory Visit: Payer: 59 | Admitting: Physician Assistant

## 2019-01-06 ENCOUNTER — Other Ambulatory Visit: Payer: Self-pay | Admitting: Physician Assistant

## 2019-02-03 ENCOUNTER — Other Ambulatory Visit: Payer: Self-pay | Admitting: Physician Assistant

## 2019-02-04 ENCOUNTER — Other Ambulatory Visit: Payer: Self-pay | Admitting: Physician Assistant

## 2019-02-05 NOTE — Telephone Encounter (Signed)
Apt 02/04 

## 2019-02-12 ENCOUNTER — Encounter: Payer: Self-pay | Admitting: Physician Assistant

## 2019-02-12 ENCOUNTER — Other Ambulatory Visit: Payer: Self-pay

## 2019-02-12 ENCOUNTER — Ambulatory Visit (INDEPENDENT_AMBULATORY_CARE_PROVIDER_SITE_OTHER): Payer: BLUE CROSS/BLUE SHIELD | Admitting: Physician Assistant

## 2019-02-12 DIAGNOSIS — F319 Bipolar disorder, unspecified: Secondary | ICD-10-CM

## 2019-02-12 DIAGNOSIS — F411 Generalized anxiety disorder: Secondary | ICD-10-CM

## 2019-02-12 DIAGNOSIS — R400 Somnolence: Secondary | ICD-10-CM

## 2019-02-12 MED ORDER — TRAZODONE HCL 100 MG PO TABS: 100.0000 mg | ORAL_TABLET | Freq: Every evening | ORAL | 5 refills | Status: DC | PRN

## 2019-02-12 MED ORDER — ESCITALOPRAM OXALATE 20 MG PO TABS: 30.0000 mg | ORAL_TABLET | Freq: Every day | ORAL | 1 refills | Status: DC

## 2019-02-12 MED ORDER — DIVALPROEX SODIUM ER 500 MG PO TB24: ORAL_TABLET | ORAL | 5 refills | Status: DC

## 2019-02-12 MED ORDER — BUPROPION HCL ER (XL) 150 MG PO TB24: ORAL_TABLET | ORAL | 5 refills | Status: DC

## 2019-02-12 MED ORDER — CLONAZEPAM 1 MG PO TABS: ORAL_TABLET | ORAL | 5 refills | Status: DC

## 2019-02-12 NOTE — Progress Notes (Signed)
[facility]  Patient ID: Latoya [facility],  MRN: [id number]  PCP: Wenda Low, MD  Date of Evaluation: 02/12/2019 Time spent:30 minutes  Chief Complaint:  Chief Complaint    Anxiety; Depression; Insomnia      HISTORY/CURRENT STATUS: HPI For routine med check.  Patient states she is not doing well.  She cries a lot.  States that her kids argue with her all the time.  She has 5 grandkids now and her kids have her "make an appointment" to be able to visit them.  States she can just drop in any time.  They live in [place] so it is hard for her to do that.  She feels rundown a lot, lacking energy and motivation.  States she falls asleep a lot on the couch.  She denies suicidal or homicidal thoughts.  Patient denies increased energy with decreased need for sleep, no increased talkativeness, no racing thoughts, no impulsivity or risky behaviors, no increased spending, no increased libido, no grandiosity.  She is more irritable at times but is usually when she is triggered by something that one of her kids have said or someone else in her family.  She does still have a lot of anxiety.  The Klonopin does help.  She usually is only taking it once a day but occasionally takes it twice a day.  Denies dizziness, syncope, seizures, numbness, tingling, tremor, tics, unsteady gait, slurred speech, confusion. Denies muscle or joint pain, stiffness, or dystonia.  Individual Medical History/ Review of Systems: Changes? :Yes   She was in the hospital in [place] few months ago for several days with "heart problems.  I do not know what they said was wrong with me.  Then I went to my heart doctor when I got home and he does not think anything was wrong.  So I do not know what to believe." (11/02/2018 ER visit at [facility] showed noncardiac chest pain)  Past medications for mental health diagnoses include: VPA, Lexapro, Wellbutrin, Traz, Klonopin, Ativan, Xanax  Allergies: Morphine and  related, Prednisone, and Codeine  Current Medications:  Current Outpatient Medications:  .  buPROPion (WELLBUTRIN XL) 150 MG 24 hr tablet, TAKE THREE TABLETS BY MOUTH EVERY MORNING, Disp: 90 tablet, Rfl: 5 .  clonazePAM (KLONOPIN) 1 MG tablet, TAKE ONE TABLET BY MOUTH TWICE A DAY AS NEEDED FOR ANXIETY, Disp: 60 tablet, Rfl: 5 .  divalproex (DEPAKOTE ER) 500 MG 24 hr tablet, TAKE TWO TABLETS BY MOUTH EVERY NIGHT AT BEDTIME, Disp: 60 tablet, Rfl: 5 .  losartan (COZAAR) 50 MG tablet, Take 50 mg by mouth daily., Disp: , Rfl: 11 .  metoprolol succinate (TOPROL-XL) 25 MG 24 hr tablet, TAKE ONE TABLET BY MOUTH DAILY, Disp: , Rfl:  .  SYNTHROID 137 MCG tablet, Take 137 mcg by mouth daily., Disp: , Rfl: 6 .  traZODone (DESYREL) 100 MG tablet, Take 1-2 tablets (100-200 mg total) by mouth at bedtime as needed for sleep., Disp: 60 tablet, Rfl: 5 .  cholecalciferol (VITAMIN D) 1000 units tablet, Take 1,000 Units by mouth daily. , Disp: , Rfl:  .  ciprofloxacin (CIPRO) 500 MG tablet, Take 1 tablet (500 mg total) by mouth 2 (two) times daily. (Patient not taking: Reported on 02/12/2019), Disp: 14 tablet, Rfl: 0 .  escitalopram (LEXAPRO) 20 MG tablet, Take 1.5 tablets (30 mg total) by mouth daily., Disp: 45 tablet, Rfl: 1 .  famotidine (PEPCID) 20 MG tablet, Take 1 tablet (20 mg total) by mouth 2 (two) times  daily. (Patient not taking: Reported on 02/20/2018), Disp: , Rfl:  .  metroNIDAZOLE (FLAGYL) 500 MG tablet, Take 1 tablet (500 mg total) by mouth 2 (two) times daily. (Patient not taking: Reported on 02/12/2019), Disp: 14 tablet, Rfl: 0 .  Multiple Vitamin (MULTIVITAMIN) tablet, Take 1 tablet by mouth at bedtime. , Disp: , Rfl:  .  Omega-3 Fatty Acids (FISH OIL PO), Take 1 capsule by mouth at bedtime. , Disp: , Rfl:  Medication Side Effects: none  Family Medical/ Social History: Changes? No  MENTAL HEALTH EXAM:  There were no vitals taken for this visit.There is no height or weight on file to calculate BMI.   General Appearance: Casual, Neat, Well Groomed and Obese  Eye Contact:  Good  Speech:  Clear and Coherent  Volume:  Normal  Mood:  Anxious, Depressed and Irritable  Affect:  Depressed, Tearful and Anxious  Thought Process:  Goal Directed and Descriptions of Associations: Intact  Orientation:  Full (Time, Place, and Person)  Thought Content: Logical   Suicidal Thoughts:  No  Homicidal Thoughts:  No  Memory:  WNL  Judgement:  Good  Insight:  Good  Psychomotor Activity:  Restlessness  Concentration:  Concentration: Good  Recall:  Good  Fund of Knowledge: Good  Language: Good  Assets:  Desire for Improvement  ADL's:  Intact  Cognition: WNL  Prognosis:  Good  Labs through [facility] health system:  11/04/2018 showed hemoglobin A1c of 6.0, lipid panel was normal, CBC was completely normal  DIAGNOSES:    ICD-10-CM   1. Bipolar I disorder (HCC)  F31.9   2. Generalized anxiety disorder  F41.1   3. Daytime somnolence  R40.0     Receiving Psychotherapy: No    RECOMMENDATIONS:  PDMP was reviewed. I spent 30 minutes with her, reviewing labs, ER visit note from 11/02/2018 Since she has not had a Depakote level in a year, that needs to be drawn.  She would like to have it done at her PCPs office.  I wrote out an order for that on a prescription note.  If I do not receive that lab result by the next visit, I will asked her to go through [facility] or [facility] to have it done.  She verbalizes understanding. We discussed the depression.  I recommend either increasing Lexapro to 30 mg or adding Lamictal.  She prefers increasing the Lexapro even though she is at the usual maximum dose, we can go a little higher as long as she is not having side effects and it is helpful.  We will monitor her closely. Continue Wellbutrin XL 150 mg 3 daily. Continue Klonopin 1 mg, 1 p.o. twice daily as needed. Continue Depakote ER 500 mg, 2 p.o. nightly. Increase Lexapro 20 mg to 1.5 pills daily. Continue  trazodone 100 mg, 1-2 p.o. nightly as needed.  I stressed that.  She does not need to take this every night if she does not need it.  It can cause increased somnolence during the day. Have trough Depakote level drawn. Recommend she get back in counseling. Return in 6 weeks.  Melony Overly, PA-C

## 2019-03-02 ENCOUNTER — Other Ambulatory Visit: Payer: Self-pay | Admitting: Physician Assistant

## 2019-03-26 ENCOUNTER — Ambulatory Visit: Payer: Self-pay | Admitting: Physician Assistant

## 2019-03-27 ENCOUNTER — Other Ambulatory Visit: Payer: Self-pay | Admitting: Physician Assistant

## 2019-07-15 ENCOUNTER — Other Ambulatory Visit: Payer: Self-pay | Admitting: Physician Assistant

## 2019-07-15 NOTE — Telephone Encounter (Signed)
Last apt 02/04 was due back 6 weeks

## 2019-08-02 ENCOUNTER — Other Ambulatory Visit: Payer: Self-pay | Admitting: Physician Assistant

## 2019-09-07 ENCOUNTER — Other Ambulatory Visit: Payer: Self-pay | Admitting: Physician Assistant

## 2019-09-07 NOTE — Telephone Encounter (Signed)
She did make apt

## 2019-10-04 ENCOUNTER — Other Ambulatory Visit: Payer: Self-pay | Admitting: Physician Assistant

## 2019-10-08 ENCOUNTER — Ambulatory Visit (INDEPENDENT_AMBULATORY_CARE_PROVIDER_SITE_OTHER): Payer: BLUE CROSS/BLUE SHIELD | Admitting: Physician Assistant

## 2019-10-08 ENCOUNTER — Other Ambulatory Visit: Payer: Self-pay

## 2019-10-08 ENCOUNTER — Encounter: Payer: Self-pay | Admitting: Physician Assistant

## 2019-10-08 DIAGNOSIS — F319 Bipolar disorder, unspecified: Secondary | ICD-10-CM | POA: Diagnosis not present

## 2019-10-08 DIAGNOSIS — F411 Generalized anxiety disorder: Secondary | ICD-10-CM

## 2019-10-08 DIAGNOSIS — G4700 Insomnia, unspecified: Secondary | ICD-10-CM

## 2019-10-08 MED ORDER — LAMOTRIGINE 25 MG PO TABS
ORAL_TABLET | ORAL | 1 refills | Status: DC
Start: 2019-10-08 — End: 2019-11-11

## 2019-10-08 MED ORDER — ESCITALOPRAM OXALATE 20 MG PO TABS: 20.0000 mg | ORAL_TABLET | Freq: Every day | ORAL | 1 refills | Status: DC

## 2019-10-08 MED ORDER — CLONAZEPAM 1 MG PO TABS: 1.0000 mg | ORAL_TABLET | Freq: Two times a day (BID) | ORAL | 1 refills | Status: DC | PRN

## 2019-10-08 NOTE — Patient Instructions (Addendum)
On the Wellbutrin (Buproprion) XL 150 mg, take 2 daily for 1 week, then 1 daily for 1 week, then stop.  Start Lamictal as directed on the bottle. Stop the drug immediately and call if you get a rash.  Continue Lexapro, Klonopin, and Depakote as currently directed.   Contact your insurance company for a list of therapist and make an appointment to see 1.  Make a current list of all medications that you are taking.  Bring that list and all the medication bottles of drugs you are taking at this time to your next visit.  Get a calendar and mark the dates of every other night that you will take the Lamictal for the next 2 weeks.  After that 2-week.,  You will take of the Lamictal every night.

## 2019-10-08 NOTE — Progress Notes (Signed)
[facility]  Patient ID: Latoya Clark,  MRN: [id number]  PCP: Latoya Housekeeper, MD  Date of Evaluation: 10/08/2019 Time spent:30 minutes  Chief Complaint:  Chief Complaint    Anxiety; Depression      HISTORY/CURRENT STATUS: HPI For routine med Clark.  Has been seeing a provider at [facility] for Psych meds b/c I am no longer in network.  States all the [facility] Drs have her 'all screwed up.' She brings her bag full of medicine but is very confused not knowing what she's really taking. She says the other provider told her she doesn't have Bipolar disorder and 'took me off all my meds for it.' But then says that she is taking everything I was giving her the last time we saw each other in February.  Patient states she is not doing well.  Has feelings of hopelessness, for 'awhile.' States she 'feels bad.' States her children won't talk to her. Worries about her grandchildren all the time, the way the world is, covid and afraid they'll get it. She has 5 grandkids and they're in preschool, which she doesn't want them to be, she tries to call them and the daughters won't pick up. She finds out all these things through the kids other grandmothers.  Says the twin grandkids cry at preschool and she doesn't want them there. She cries a lot.  She feels rundown a lot, lacking energy and motivation.  Hard time falling asleep and staying asleep.  She denies suicidal or homicidal thoughts.  Patient denies increased energy with decreased need for sleep, no increased talkativeness, no racing thoughts, no impulsivity or risky behaviors, no increased spending, no increased libido, no grandiosity.  She is more irritable at times but is usually when she is triggered by something that one of her kids have said or someone else in her family.  She does still have a lot of anxiety.  The Klonopin does help.  She usually is only taking it once a day but occasionally takes it twice a day. States the  psychiatrist she was seeing at [facility] cut the [facility] in half. I am able to confirm that on the [facility]. Patient states it is not nearly as effective as it was when I had her on it in the past.  Denies dizziness, syncope, seizures, numbness, tingling, tremor, tics, unsteady gait, slurred speech, confusion. Denies muscle or joint pain, stiffness, or dystonia.  Individual Medical History/ Review of Systems: Changes? :Yes   See HPI.  Past medications for mental health diagnoses include: VPA, Lexapro, Wellbutrin, Traz, Klonopin, Zoloft, Prozac, Ativan, Xanax  Allergies: Morphine and related, Prednisone, and Codeine  Current Medications:  Current Outpatient Medications:    clonazePAM (KLONOPIN) 1 MG tablet, Take 1 tablet (1 mg total) by mouth 2 (two) times daily as needed for anxiety., Disp: 60 tablet, Rfl: 1   divalproex (DEPAKOTE ER) 500 MG 24 hr tablet, TAKE TWO TABLETS BY MOUTH EVERY NIGHT AT BEDTIME, Disp: 60 tablet, Rfl: 5   escitalopram (LEXAPRO) 20 MG tablet, Take 1 tablet (20 mg total) by mouth daily., Disp: 30 tablet, Rfl: 1   SYNTHROID 137 MCG tablet, Take 137 mcg by mouth daily., Disp: , Rfl: 6   traZODone (DESYREL) 100 MG tablet, Take 1-2 tablets (100-200 mg total) by mouth at bedtime as needed for sleep., Disp: 60 tablet, Rfl: 5   cholecalciferol (VITAMIN D) 1000 units tablet, Take 1,000 Units by mouth daily.  (Patient not taking: Reported on  10/08/2019), Disp: , Rfl:    ciprofloxacin (CIPRO) 500 MG tablet, Take 1 tablet (500 mg total) by mouth 2 (two) times daily. (Patient not taking: Reported on 02/12/2019), Disp: 14 tablet, Rfl: 0   famotidine (PEPCID) 20 MG tablet, Take 1 tablet (20 mg total) by mouth 2 (two) times daily. (Patient not taking: Reported on 02/20/2018), Disp: , Rfl:    lamoTRIgine (LAMICTAL) 25 MG tablet, 1 po every other night for 2 weeks, then 1 po qhs., Disp: 30 tablet, Rfl: 1   losartan (COZAAR) 50 MG tablet, Take 50 mg by mouth daily.,  Disp: , Rfl: 11   metoprolol succinate (TOPROL-XL) 25 MG 24 hr tablet, TAKE ONE TABLET BY MOUTH DAILY, Disp: , Rfl:    metroNIDAZOLE (FLAGYL) 500 MG tablet, Take 1 tablet (500 mg total) by mouth 2 (two) times daily. (Patient not taking: Reported on 02/12/2019), Disp: 14 tablet, Rfl: 0   Multiple Vitamin (MULTIVITAMIN) tablet, Take 1 tablet by mouth at bedtime.  (Patient not taking: Reported on 10/08/2019), Disp: , Rfl:    Omega-3 Fatty Acids (FISH OIL PO), Take 1 capsule by mouth at bedtime.  (Patient not taking: Reported on 10/08/2019), Disp: , Rfl:  Medication Side Effects: none  Family Medical/ Social History: Changes? No  MENTAL HEALTH EXAM:  There were no vitals taken for this visit.There is no height or weight on file to calculate BMI.  General Appearance: Casual, Neat, Well Groomed and Obese  Eye Contact:  Good  Speech:  Clear and Coherent  Volume:  Normal  Mood:  Anxious, Depressed and Irritable  Affect:  Depressed, Tearful and Anxious  Thought Process:  Goal Directed and Descriptions of Associations: Intact  Orientation:  Full (Time, Place, and Person)  Thought Content: Logical   Suicidal Thoughts:  No  Homicidal Thoughts:  No  Memory:  WNL  Judgement:  Good  Insight:  Good  Psychomotor Activity:  Restlessness  Concentration:  Concentration: Good and Attention Span: Good  Recall:  Good  Fund of Knowledge: Good  Language: Good  Assets:  Desire for Improvement  ADL's:  Intact  Cognition: WNL  Prognosis:  Good    DIAGNOSES:    ICD-10-CM   1. Bipolar depression (HCC)  F31.9   2. Generalized anxiety disorder  F41.1   3. Insomnia, unspecified type  G47.00     Receiving Psychotherapy: No    RECOMMENDATIONS:  PDMP was reviewed. I provided 30 minutes of face to face time during this encounter. It is extremely difficult to know what Latoya Clark is taking and what she is not. I firmly told her she needs to be in charge of her medications, knowing what she takes and how much.  She needs to bring the bottles that contain medication she is taking at the time, to her next visit. She also needs a written current list. She needs to get a medication pillbox and fill it as is appropriate so she can keep up with her meds better. Basically, I don't know if she is being medicated properly or not. Counseled patient regarding potential benefits, risks, and side effects of Lamictal to include potential risk of Stevens-Johnson syndrome. Advised patient to stop taking Lamictal and contact office immediately if rash develops and to seek urgent medical attention if rash is severe and/or spreading quickly.  Patient understands and accepts these risks. If she is taking the Wellbutrin, which she finally said she thought she was, it could be meeting to increased anxiety so she will wean off it  by taking 300 mg, 1 p.o. daily for 1 week, then 150 mg daily for 1 week and then stop it. Increase Klonopin back to 1 mg, 1/2-1 p.o. twice daily as needed. Continue Depakote ER 500 mg, 2 p.o. nightly. Continue Lexapro 20 mg, 1 p.o. daily. Start Lamictal 25 mg, 1 p.o. q. oh at bedtime for 2 weeks and then 1 p.o. nightly. I stressed the importance of this. Continue trazodone 100 mg, 1-2 p.o. nightly as needed for sleep. Strongly recommend counseling.   Return in 4-6 weeks.  Melony Overly, PA-C

## 2019-11-03 ENCOUNTER — Other Ambulatory Visit: Payer: Self-pay | Admitting: Physician Assistant

## 2019-11-11 ENCOUNTER — Ambulatory Visit (INDEPENDENT_AMBULATORY_CARE_PROVIDER_SITE_OTHER): Payer: BLUE CROSS/BLUE SHIELD | Admitting: Physician Assistant

## 2019-11-11 ENCOUNTER — Encounter: Payer: Self-pay | Admitting: Physician Assistant

## 2019-11-11 ENCOUNTER — Other Ambulatory Visit: Payer: Self-pay

## 2019-11-11 DIAGNOSIS — F411 Generalized anxiety disorder: Secondary | ICD-10-CM | POA: Diagnosis not present

## 2019-11-11 DIAGNOSIS — Z79899 Other long term (current) drug therapy: Secondary | ICD-10-CM

## 2019-11-11 DIAGNOSIS — G4700 Insomnia, unspecified: Secondary | ICD-10-CM | POA: Diagnosis not present

## 2019-11-11 DIAGNOSIS — F3160 Bipolar disorder, current episode mixed, unspecified: Secondary | ICD-10-CM | POA: Diagnosis not present

## 2019-11-11 MED ORDER — DIVALPROEX SODIUM ER 500 MG PO TB24: ORAL_TABLET | ORAL | 1 refills | Status: DC

## 2019-11-11 MED ORDER — ESCITALOPRAM OXALATE 20 MG PO TABS: 30.0000 mg | ORAL_TABLET | Freq: Every day | ORAL | 1 refills | Status: DC

## 2019-11-11 NOTE — Patient Instructions (Signed)
Beginning 11/11/2019 start taking the Depakote 3 pills at night instead of 2.  On either November 11, 12th, 15th, or 16th, get your blood drawn at [facility] labs in the morning sometime before noon.

## 2019-11-11 NOTE — Progress Notes (Signed)
[facility]  Patient ID: Latoya Clark,  MRN: [id number]  PCP: Georgann Housekeeper, MD  Date of Evaluation: 11/11/2019 Time spent:30 minutes  Chief Complaint:  Chief Complaint    Anxiety; Depression; Insomnia      HISTORY/CURRENT STATUS: HPI For routine med check.  Has been manic lately.  Spending more for 3-4 weeks. Buying more, b/c she's afraid she won't be able to buy things for her grandchildren for Christmas. Bought a lot of jewelry for her daughter's 40th birthday. "I went overboard.". Is more talkative. No increased energy. No risky behavior. No increased libido, no grandiosity. No paranoia, no AH/VI.  Lamictal caused itching so she stopped it. Doesn't enjoy anything except shopping. Her niece died 3 weeks ago in an MVA. So "that may have a lot to do with it." Pt has low energy and motivation. Still doesn't sleep well. Might get around 5 hours of sleep. Is worried about her dog who is at the vet after a surgery.   Klonopin helps the anxiety.  She usually is only taking it once a day but occasionally takes it twice a day.   She brought in all of her meds, in original bottles, today. "I didn't want you to be mad at me because I didn't know what I was taking."  Denies dizziness, syncope, seizures, numbness, tingling, tremor, tics, unsteady gait, slurred speech, confusion. Denies muscle or joint pain, stiffness, or dystonia.  Individual Medical History/ Review of Systems: Changes? :No     Past medications for mental health diagnoses include: VPA, Lexapro, Wellbutrin, Traz, Klonopin, Zoloft, Prozac, Ativan, Xanax, Lamictal caused a rash.  Allergies: Lamictal [lamotrigine], Morphine and related, Prednisone, and Codeine  Current Medications:  Current Outpatient Medications:  .  amLODipine (NORVASC) 5 MG tablet, Take by mouth., Disp: , Rfl:  .  atorvastatin (LIPITOR) 80 MG tablet, Take 80 mg by mouth daily., Disp: , Rfl:  .  clonazePAM (KLONOPIN) 1 MG tablet, Take 1  tablet (1 mg total) by mouth 2 (two) times daily as needed for anxiety., Disp: 60 tablet, Rfl: 1 .  divalproex (DEPAKOTE ER) 500 MG 24 hr tablet, TAKE 3 TABLETS BY MOUTH EVERY NIGHT AT BEDTIME, Disp: 90 tablet, Rfl: 1 .  escitalopram (LEXAPRO) 20 MG tablet, Take 1.5 tablets (30 mg total) by mouth daily., Disp: 45 tablet, Rfl: 1 .  losartan (COZAAR) 50 MG tablet, Take 50 mg by mouth daily., Disp: , Rfl: 11 .  metoprolol succinate (TOPROL-XL) 25 MG 24 hr tablet, TAKE ONE TABLET BY MOUTH DAILY, Disp: , Rfl:  .  SYNTHROID 137 MCG tablet, Take 137 mcg by mouth daily., Disp: , Rfl: 6 .  traZODone (DESYREL) 100 MG tablet, Take 1-2 tablets (100-200 mg total) by mouth at bedtime as needed for sleep., Disp: 60 tablet, Rfl: 5 .  cholecalciferol (VITAMIN D) 1000 units tablet, Take 1,000 Units by mouth daily.  (Patient not taking: Reported on 10/08/2019), Disp: , Rfl:  .  famotidine (PEPCID) 20 MG tablet, Take 1 tablet (20 mg total) by mouth 2 (two) times daily. (Patient not taking: Reported on 02/20/2018), Disp: , Rfl:  .  metroNIDAZOLE (FLAGYL) 500 MG tablet, Take 1 tablet (500 mg total) by mouth 2 (two) times daily. (Patient not taking: Reported on 02/12/2019), Disp: 14 tablet, Rfl: 0 .  Multiple Vitamin (MULTIVITAMIN) tablet, Take 1 tablet by mouth at bedtime.  (Patient not taking: Reported on 10/08/2019), Disp: , Rfl:  .  Omega-3 Fatty Acids (FISH OIL PO), Take 1 capsule by  mouth at bedtime.  (Patient not taking: Reported on 10/08/2019), Disp: , Rfl:  Medication Side Effects: itching with Lamictal.  No rash.  Family Medical/ Social History: Changes? No  MENTAL HEALTH EXAM:  There were no vitals taken for this visit.There is no height or weight on file to calculate BMI.  General Appearance: Casual, Neat, Well Groomed and Obese  Eye Contact:  Good  Speech:  Clear and Coherent and Talkative  Volume:  Normal  Mood:  Euthymic  Affect:  Congruent  Thought Process:  Goal Directed and Descriptions of  Associations: Intact  Orientation:  Full (Time, Place, and Person)  Thought Content: Logical   Suicidal Thoughts:  No  Homicidal Thoughts:  No  Memory:  WNL  Judgement:  Good  Insight:  Good  Psychomotor Activity:  Normal  Concentration:  Concentration: Good and Attention Span: Good  Recall:  Good  Fund of Knowledge: Good  Language: Good  Assets:  Desire for Improvement  ADL's:  Intact  Cognition: WNL  Prognosis:  Good   Most recent pertinent labs: 03/24/2019   Depakote level 83 10/20/2019  Platelets 270, Hgb A1C 6.0, Glu 100, LFTS nl,   DIAGNOSES:    ICD-10-CM   1. Bipolar I disorder, most recent episode mixed (HCC)  F31.60   2. Encounter for long-term (current) use of medications  Z79.899 Valproic acid level  3. Generalized anxiety disorder  F41.1   4. Insomnia, unspecified type  G47.00     Receiving Psychotherapy: No    RECOMMENDATIONS:  PDMP was reviewed. I provided 30 minutes of face to face time during this encounter, including lab review with patient and thorough review of meds.  Lamictal caused itching but no rash, so she stopped it. Recommend increasing Lexapro for depression, but watch for increased mania. Also suggest increase Depakote.  Continue Klonopin back 1 mg, 1/2-1 p.o. twice daily as needed. Increase Depakote ER 500 mg, to 3 p.o. nightly. Increase Lexapro 20 mg, to 1.5 pills  p.o. daily. Continue trazodone 100 mg, 1-2 p.o. nightly as needed for sleep. Ordered Depakote level mid-Nov.  Strongly recommend counseling.   Return in 6 weeks.  Melony Overly, PA-C

## 2019-12-23 ENCOUNTER — Ambulatory Visit: Payer: BLUE CROSS/BLUE SHIELD | Admitting: Physician Assistant

## 2019-12-25 ENCOUNTER — Encounter: Payer: Self-pay | Admitting: Physician Assistant

## 2019-12-25 ENCOUNTER — Ambulatory Visit (INDEPENDENT_AMBULATORY_CARE_PROVIDER_SITE_OTHER): Payer: BLUE CROSS/BLUE SHIELD | Admitting: Physician Assistant

## 2019-12-25 ENCOUNTER — Other Ambulatory Visit: Payer: Self-pay

## 2019-12-25 DIAGNOSIS — F3160 Bipolar disorder, current episode mixed, unspecified: Secondary | ICD-10-CM | POA: Diagnosis not present

## 2019-12-25 DIAGNOSIS — G4700 Insomnia, unspecified: Secondary | ICD-10-CM

## 2019-12-25 DIAGNOSIS — F411 Generalized anxiety disorder: Secondary | ICD-10-CM | POA: Diagnosis not present

## 2019-12-25 MED ORDER — DIVALPROEX SODIUM ER 500 MG PO TB24: ORAL_TABLET | ORAL | 2 refills | Status: DC

## 2019-12-25 MED ORDER — ESCITALOPRAM OXALATE 20 MG PO TABS: 30.0000 mg | ORAL_TABLET | Freq: Every day | ORAL | 2 refills | Status: DC

## 2019-12-25 MED ORDER — CLONAZEPAM 1 MG PO TABS: 1.0000 mg | ORAL_TABLET | Freq: Two times a day (BID) | ORAL | 2 refills | Status: DC | PRN

## 2019-12-25 NOTE — Progress Notes (Signed)
[facility]  Patient ID: Latoya Clark,  MRN: [id number]  PCP: Georgann Housekeeper, MD  Date of Evaluation: 12/25/2019 Time spent:30 minutes  Chief Complaint:  Chief Complaint    Anxiety; Depression; Follow-up      HISTORY/CURRENT STATUS: HPI For routine med check.   She didn't go up on the Depakote, as recommended at last visit.  She doesn't know why.  "I guess I just did not want to."  States she feels well even without the extra Depakote.  The irritability and anger is not as much of an issue.  She did increase the Lexapro and feels that that has been helpful with her mood. Anxiety is somewhat better too, since increasing the Lexapro. Things don't bother her like they did.   Reports being more interested in her usual activities and is able to enjoy things.  Denies decreased energy or motivation.  Appetite has not changed.  No extreme sadness, tearfulness, or feelings of hopelessness.  Sleeps well most of the time.  Does not cry easily.  Personal hygiene is normal.  Denies suicidal or homicidal thoughts.  Patient denies increased energy with decreased need for sleep, no increased talkativeness, no racing thoughts, no impulsivity or risky behaviors, no increased spending, no increased libido, no grandiosity, no increased irritability or anger, and no hallucinations.  Denies dizziness, syncope, seizures, numbness, tingling, tremor, tics, unsteady gait, slurred speech, confusion.   Individual Medical History/ Review of Systems: Changes? Mancel Bale down one step a few days ago and twisted her left knee.  Has seen her PCP.  X-ray results pending.  Past medications for mental health diagnoses include: VPA, Lexapro, Wellbutrin, Traz, Klonopin, Zoloft, Prozac, Ativan, Xanax, Lamictal caused a rash.  Allergies: Lamictal [lamotrigine], Morphine and related, Prednisone, and Codeine  Current Medications:  Current Outpatient Medications:  .  amLODipine (NORVASC) 5 MG tablet,  Take by mouth., Disp: , Rfl:  .  atorvastatin (LIPITOR) 80 MG tablet, Take 80 mg by mouth daily., Disp: , Rfl:  .  losartan (COZAAR) 50 MG tablet, Take 50 mg by mouth daily., Disp: , Rfl: 11 .  metoprolol succinate (TOPROL-XL) 25 MG 24 hr tablet, TAKE ONE TABLET BY MOUTH DAILY, Disp: , Rfl:  .  SYNTHROID 137 MCG tablet, Take 137 mcg by mouth daily., Disp: , Rfl: 6 .  traMADol (ULTRAM) 50 MG tablet, Take by mouth., Disp: , Rfl:  .  traZODone (DESYREL) 100 MG tablet, Take 1-2 tablets (100-200 mg total) by mouth at bedtime as needed for sleep., Disp: 60 tablet, Rfl: 5 .  cholecalciferol (VITAMIN D) 1000 units tablet, Take 1,000 Units by mouth daily.  (Patient not taking: No sig reported), Disp: , Rfl:  .  clonazePAM (KLONOPIN) 1 MG tablet, Take 1 tablet (1 mg total) by mouth 2 (two) times daily as needed for anxiety., Disp: 60 tablet, Rfl: 2 .  divalproex (DEPAKOTE ER) 500 MG 24 hr tablet, TAKE 3 TABLETS BY MOUTH EVERY NIGHT AT BEDTIME, Disp: 90 tablet, Rfl: 2 .  escitalopram (LEXAPRO) 20 MG tablet, Take 1.5 tablets (30 mg total) by mouth daily., Disp: 45 tablet, Rfl: 2 .  famotidine (PEPCID) 20 MG tablet, Take 1 tablet (20 mg total) by mouth 2 (two) times daily. (Patient not taking: No sig reported), Disp: , Rfl:  .  metroNIDAZOLE (FLAGYL) 500 MG tablet, Take 1 tablet (500 mg total) by mouth 2 (two) times daily. (Patient not taking: No sig reported), Disp: 14 tablet, Rfl: 0 .  Multiple Vitamin (  MULTIVITAMIN) tablet, Take 1 tablet by mouth at bedtime.  (Patient not taking: No sig reported), Disp: , Rfl:  .  Omega-3 Fatty Acids (FISH OIL PO), Take 1 capsule by mouth at bedtime.  (Patient not taking: No sig reported), Disp: , Rfl:  Medication Side Effects: none  Family Medical/ Social History: Changes? No  MENTAL HEALTH EXAM:  There were no vitals taken for this visit.There is no height or weight on file to calculate BMI.  General Appearance: Casual, Neat, Well Groomed and Obese  Eye Contact:  Good   Speech:  Normal Rate  Volume:  Normal  Mood:  Euthymic  Affect:  Appropriate  Thought Process:  Goal Directed and Descriptions of Associations: Intact  Orientation:  Full (Time, Place, and Person)  Thought Content: Logical   Suicidal Thoughts:  No  Homicidal Thoughts:  No  Memory:  WNL  Judgement:  Good  Insight:  Good  Psychomotor Activity:  Walks slowly with a cane, due to the knee pain.  Concentration:  Concentration: Good  Recall:  Good  Fund of Knowledge: Good  Language: Good  Assets:  Desire for Improvement  ADL's:  Intact  Cognition: WNL  Prognosis:  Good   Most recent pertinent labs: Depakote level was NOT done as ordered in Nov.  12/18/2019 CMP LFTs nl   DIAGNOSES:    ICD-10-CM   1. Bipolar I disorder, most recent episode mixed (HCC)  F31.60   2. Generalized anxiety disorder  F41.1   3. Insomnia, unspecified type  G47.00     Receiving Psychotherapy: No    RECOMMENDATIONS:  PDMP was reviewed.  I provided 30 minutes of face-to-face time during this encounter during which we discussed lab results and that the Depakote level was not drawn.  She will go back to the lab and have it done.  She has the order.  Neither of [place] are sure why of the CMP was done but the Depakote level was not.  At any rate, she will get that result to me. I am glad to see that her mood is better overall.  No changes will be made. Continue Klonopin 1 mg, 1 p.o. twice daily as needed. Continue Depakote ER 500 mg, 2 p.o. nightly. Continue Lexapro 20 mg, 1.5 pills daily. Continue trazodone 100 mg, 1-2 nightly as needed sleep. Return in 3 months.  Melony Overly, PA-C

## 2020-02-23 ENCOUNTER — Other Ambulatory Visit: Payer: Self-pay | Admitting: Physician Assistant

## 2020-02-28 NOTE — Progress Notes (Signed)
Cardiology Office Note:   Date:  03/01/2020  NAME:  [name]    MRN: [id number] DOB:  08-10-1960   PCP:  Georgann Housekeeper, MD  Cardiologist:  No primary care provider on file.   Referring MD: Georgann Housekeeper, MD   Chief Complaint  Patient presents with  . Shortness of Breath  . Edema   History of Present Illness:   Latoya Clark is a 60 y.o. female with a hx of Tobacco abuse, anxiety/depression, HTN who is being seen today for the evaluation of LE edema at the request of Georgann Housekeeper, MD.  She presents for evaluation of shortness of breath and lower extremity edema.  For the past 2 to 3 weeks she had edema in her lower extremities.  She reports it is worse in the day.  She also has noticed a red hue to her legs.  She is also short of breath.  She is been short of breath for years.  She had extensive cardiac evaluation [facility] that included a normal echocardiogram and normal stress test.  She also underwent a coronary CTA in [place] in 2020 that showed nonobstructive CAD.  She reports that she has been followed by pulmonary reports [facility] and diagnosed with restrictive lung disease.  I cannot see the results of that testing.  I think she needs repeat lung evaluation.  She also endorses a history of sarcoidosis.  She reports her shortness of breath has been attributed to this but she is not on extensive sarcoidosis treatment.  Her primary care physician put her on Aldactone as needed for lower extremity edema and sent her here for further evaluation.  She denies any chest pain or pressure.  She reports she does get short of breath and has worsening lower extremity edema.  She is a smoker and smoked nearly 15 pack years.  No alcohol or illicit drug use is reported.  She reports she is disabled.  EKG in office demonstrates normal sinus rhythm with a heart rate 85 with no acute ischemic changes or evidence for infarction.  She never had a heart attack or stroke.   Heart disease reported in paternal grandfather.  Problem List 1. HTN 2.  Obesity -BMI 43 3.  Nonobstructive CAD -LAD/RCA 25 to 49% -Coronary CTA for 04/2018 [facility] 4.  Prediabetes -A1c 6.3 5.  Hyperlipidemia -Total cholesterol 142, HDL 52, LDL 71, triglycerides 103 6.  Sarcoidosis 7.  Restrictive lung disease  Past Medical History: Past Medical History:  Diagnosis Date  . Active smoker    1 PPD  . Anxiety   . Arthritis   . Colitis   . Depression   . Diverticulitis   . History of IBS   . Hypertension   . Insomnia   . Mastodynia   . Osteopenia 03/2017   T score -1.6 FRAX 5.2%/0.2% stable from prior DEXA  . Sarcoid   . Thyroid disease   . UTI (lower urinary tract infection)   . Vaginal septum     Past Surgical History: Past Surgical History:  Procedure Laterality Date  . CESAREAN SECTION  1981, 1984  . CHOLECYSTECTOMY N/A 01/05/2013   Procedure: LAPAROSCOPIC CHOLECYSTECTOMY WITH attempted INTRAOPERATIVE CHOLANGIOGRAM;  Surgeon: Atilano Ina, MD;  Location: [facility] OR;  Service: General;  Laterality: N/A;  . COPD    . HYSTEROSCOPY  02/13/07   HYSTEROSCOPY, D&C, MIRENA INSERTED  . REPLACEMENT TOTAL KNEE Right 05/12  . THYROIDECTOMY  2002  . TONSILLECTOMY  Current Medications: Current Meds  Medication Sig  . albuterol (VENTOLIN HFA) 108 (90 Base) MCG/ACT inhaler 2 puffs as needed  . atorvastatin (LIPITOR) 80 MG tablet Take 80 mg by mouth daily.  . cholecalciferol (VITAMIN D) 1000 units tablet Take 1,000 Units by mouth daily.  . clonazePAM (KLONOPIN) 1 MG tablet Take 1 tablet (1 mg total) by mouth 2 (two) times daily as needed for anxiety.  . divalproex (DEPAKOTE ER) 500 MG 24 hr tablet TAKE 3 TABLETS BY MOUTH EVERY NIGHT AT BEDTIME  . escitalopram (LEXAPRO) 20 MG tablet Take 1.5 tablets (30 mg total) by mouth daily.  . famotidine (PEPCID) 20 MG tablet Take 1 tablet (20 mg total) by mouth 2 (two) times daily.  . furosemide (LASIX) 20 MG tablet Take 1  tablet (20 mg total) by mouth daily.  Marland Kitchen losartan (COZAAR) 50 MG tablet Take 50 mg by mouth daily.  . metoprolol succinate (TOPROL-XL) 25 MG 24 hr tablet TAKE ONE TABLET BY MOUTH DAILY  . Multiple Vitamin (MULTIVITAMIN) tablet Take 1 tablet by mouth at bedtime.  Marland Kitchen SYNTHROID 137 MCG tablet Take 137 mcg by mouth daily.  . traMADol (ULTRAM) 50 MG tablet Take by mouth.  . traZODone (DESYREL) 100 MG tablet TAKE ONE TO TWO TABLETS BY MOUTH EVERY NIGHT AT BEDTIME AS NEEDED FOR SLEEP  . [DISCONTINUED] amLODipine (NORVASC) 5 MG tablet Take by mouth.     Allergies:    Lamictal [lamotrigine], Lisinopril, Morphine and related, Prednisone, and Codeine   Social History: Social History   Socioeconomic History  . Marital status: Married    Spouse name: Not on file  . Number of children: 2  . Years of education: Not on file  . Highest education level: Not on file  Occupational History  . Occupation: disabled  Tobacco Use  . Smoking status: Current Every Day Smoker    Packs/day: 0.75    Years: 20.00    Pack years: 15.00    Types: Cigarettes  . Smokeless tobacco: Never Used  . Tobacco comment: occ alcohol  Vaping Use  . Vaping Use: Never used  Substance and Sexual Activity  . Alcohol use: Yes    Alcohol/week: 0.0 standard drinks    Comment: Rare  . Drug use: No  . Sexual activity: Not Currently  Other Topics Concern  . Not on file  Social History Narrative  . Not on file   Social Determinants of Health   Financial Resource Strain: Not on file  Food Insecurity: Not on file  Transportation Needs: Not on file  Physical Activity: Not on file  Stress: Not on file  Social Connections: Not on file     Family History: The patient's family history includes Anxiety disorder in her maternal grandmother, mother, paternal grandfather, and paternal grandmother; Bipolar disorder in her sister; Cancer in her father and paternal grandmother; Depression in her father, maternal grandmother, mother,  paternal grandfather, and paternal grandmother; Diabetes in her father, paternal grandfather, and paternal grandmother; Heart disease in her paternal grandfather; Hypertension in her father and sister; Schizophrenia in her maternal grandfather; Thyroid disease in her father.  ROS:   All other ROS reviewed and negative. Pertinent positives noted in the HPI.     EKGs/Labs/Other Studies Reviewed:   The following studies were personally reviewed by me today:  EKG:  EKG is ordered today.  The ekg ordered today demonstrates normal sinus rhythm heart rate 85, no acute ischemic changes, no evidence of prior infarction, and was personally reviewed  by me.   Echocardiogram 05/2018 [facility]) EF 55-60%, normal LV function  Nuclear medicine MPI 04/2018 [facility]) Normal myocardial perfusion imaging study  Cardiac CTA 02/2018 ([facility]) Proximal LAD 25 to 49% Left circumflex less than 25% RCA 25 to 49%  Recent Labs: No results found for requested labs within last 8760 hours.   Recent Lipid Panel    Component Value Date/Time   CHOL 192 06/08/2008 0902   TRIG 65.0 06/08/2008 0902   HDL 62.00 06/08/2008 0902   CHOLHDL 3 06/08/2008 0902   VLDL 13.0 06/08/2008 0902   LDLCALC 117 (H) 06/08/2008 0902    Physical Exam:   VS:  BP 120/86   Pulse 85   Ht 5' 3.5" (1.613 m)   Wt 245 lb 3.2 oz (111.2 kg)   SpO2 93%   BMI 42.75 kg/m    Wt Readings from Last 3 Encounters:  03/01/20 245 lb 3.2 oz (111.2 kg)  11/20/17 229 lb (103.9 kg)  04/26/17 220 lb (99.8 kg)    General: Well nourished, well developed, in no acute distress Head: Atraumatic, normal size  Eyes: PEERLA, EOMI  Neck: Supple, no JVD Endocrine: No thryomegaly Cardiac: Normal S1, S2; RRR; no murmurs, rubs, or gallops Lungs: Clear to auscultation bilaterally, no wheezing, rhonchi or rales  Abd: Soft, nontender, no hepatomegaly  Ext: Trace edema in the lower extremities, venous insufficiency changes  noted Musculoskeletal: No deformities, BUE and BLE strength normal and equal Skin: Warm and dry, no rashes   Neuro: Alert and oriented to person, place, time, and situation, CNII-XII grossly intact, no focal deficits  Psych: Normal mood and affect   ASSESSMENT:   Latoya Clark is a 60 y.o. female who presents for the following: 1. SOB (shortness of breath) on exertion   2. Leg edema   3. Primary hypertension   4. Tobacco abuse   5. Coronary artery disease involving native coronary artery of native heart without angina pectoris   6. Mixed hyperlipidemia   7. Obesity, morbid, BMI 40.0-49.9 (HCC)     PLAN:   1. SOB (shortness of breath) on exertion 2. Leg edema -Years of shortness of breath.  She reports several weeks of increased lower extremity edema.  Her legs have venous insufficiency changes noted.  She has a red hue suggestive of venous pooling.  She has no elevated JVD.  Her lungs are clear.  I have a low suspicion for congestive heart failure.  I would like to check a BNP.  I want to repeat an echocardiogram.  She also needs ABIs and vascular arterial duplexes of the lower extremities.  I would also like to check a venous reflux study of the lower extremities. -She has a 15-pack-year history.  She reports restrictive lung disease.  She also reports sarcoidosis.  Really unclear what is going on here.  I think she needs to have evaluation by pulmonary.  Referral to Mechele Collin.  Her symptoms could be COPD related and/or related to her restrictive lung disease.  She is still smoking.  I think pulmonary evaluation is warranted. -Coronary CTA in April 2020 in [facility] demonstrated nonobstructive CAD.  I see no need for further coronary evaluation. -She is also on amlodipine.  This can cause lower extremity.  I would like to stop her amlodipine.  We will stop her Aldactone.  She should take Lasix daily.  I think this will be more effective diuretic.  3. Primary  hypertension -Stop amlodipine.  Stop Aldactone.  Lasix 20 mg daily.  4. Tobacco abuse -Smoking cessation advised.  5.  Nonobstructive CAD 6.  Hyperlipidemia -Coronary CTA in April 2020 demonstrated nonobstructive CAD.  Her most recent LDL cholesterol 71.  She is on Lipitor 80 mg daily.  This is close to goal.  We will discuss an aspirin when I see her back.  Disposition: Return in about 6 weeks (around 04/12/2020).  Medication Adjustments/Labs and Tests Ordered: Current medicines are reviewed at length with the patient today.  Concerns regarding medicines are outlined above.  Orders Placed This Encounter  Procedures  . Brain natriuretic peptide  . Ambulatory referral to Pulmonology  . EKG 12-Lead  . ECHOCARDIOGRAM COMPLETE  . VAS [place] LOWER EXTREMITY VENOUS REFLUX  . VAS [place] LOWER EXTREMITY ARTERIAL DUPLEX   Meds ordered this encounter  Medications  . furosemide (LASIX) 20 MG tablet    Sig: Take 1 tablet (20 mg total) by mouth daily.    Dispense:  90 tablet    Refill:  3    Patient Instructions  Medication Instructions:  Stop Amlodipine Stop Aldactone Start Lasix 20 mg daily   *If you need a refill on your cardiac medications before your next appointment, please call your pharmacy*   Lab Work: BNP today  If you have labs (blood work) drawn today and your tests are completely normal, you will receive your results only by: Marland Kitchen MyChart Message (if you have MyChart) OR . A paper copy in the mail If you have any lab test that is abnormal or we need to change your treatment, we will call you to review the results.   Testing/Procedures: Echocardiogram - Your physician has requested that you have an echocardiogram. Echocardiography is a painless test that uses sound waves to create images of your heart. It provides your doctor with information about the size and shape of your heart and how well your heart's chambers and valves are working. This procedure takes approximately one  hour. There are no restrictions for this procedure. This will be performed at our [facility] location - [address].  Your physician has requested that you have a lower or upper extremity arterial duplex. This test is an ultrasound of the arteries in the legs or arms. It looks at arterial blood flow in the legs and arms. Allow one hour for Lower and Upper Arterial scans. There are no restrictions or special instructions  Lower extremity venous reflux    Follow-Up: At [facility], you and your health needs are our priority.  As part of our continuing mission to provide you with exceptional heart care, we have created designated Provider Care Teams.  These Care Teams include your primary Cardiologist (physician) and Advanced Practice Providers (APPs -  Physician Assistants and Nurse Practitioners) who all work together to provide you with the care you need, when you need it.  We recommend signing up for the patient portal called "MyChart".  Sign up information is provided on this After Visit Summary.  MyChart is used to connect with patients for Virtual Visits (Telemedicine).  Patients are able to view lab/test results, encounter notes, upcoming appointments, etc.  Non-urgent messages can be sent to your provider as well.   To learn more about what you can do with MyChart, go to [email or web address].    Your next appointment:   6 week(s)  The format for your next appointment:   In Person  Provider:   Lennie Odor, MD  Other Instructions Referral to Dr.Ellison- they will contact you to set up an appointment     Signed, Gerri SporeWesley T. Flora Lipps'Neal, MD [facility]  [facility]  [address] [place], [place] [address] [phone]  03/01/2020 3:46 PM

## 2020-03-01 ENCOUNTER — Ambulatory Visit (INDEPENDENT_AMBULATORY_CARE_PROVIDER_SITE_OTHER): Payer: 59 | Admitting: Cardiovascular Disease

## 2020-03-01 ENCOUNTER — Encounter: Payer: Self-pay | Admitting: Cardiovascular Disease

## 2020-03-01 ENCOUNTER — Other Ambulatory Visit: Payer: Self-pay

## 2020-03-01 VITALS — BP 120/86 | HR 85 | Ht 63.5 in | Wt 245.2 lb

## 2020-03-01 DIAGNOSIS — Z720 Tobacco use: Secondary | ICD-10-CM | POA: Diagnosis not present

## 2020-03-01 DIAGNOSIS — R600 Localized edema: Secondary | ICD-10-CM

## 2020-03-01 DIAGNOSIS — I10 Essential (primary) hypertension: Secondary | ICD-10-CM | POA: Diagnosis not present

## 2020-03-01 DIAGNOSIS — E782 Mixed hyperlipidemia: Secondary | ICD-10-CM

## 2020-03-01 DIAGNOSIS — R0602 Shortness of breath: Secondary | ICD-10-CM | POA: Diagnosis not present

## 2020-03-01 DIAGNOSIS — I2510 Atherosclerotic heart disease of native coronary artery without angina pectoris: Secondary | ICD-10-CM

## 2020-03-01 MED ORDER — FUROSEMIDE 20 MG PO TABS: 20.0000 mg | ORAL_TABLET | Freq: Every day | ORAL | 3 refills | Status: DC

## 2020-03-01 NOTE — Patient Instructions (Addendum)
Medication Instructions:  Stop Amlodipine Stop Aldactone Start Lasix 20 mg daily   *If you need a refill on your cardiac medications before your next appointment, please call your pharmacy*   Lab Work: BNP today  If you have labs (blood work) drawn today and your tests are completely normal, you will receive your results only by: Marland Kitchen MyChart Message (if you have MyChart) OR . A paper copy in the mail If you have any lab test that is abnormal or we need to change your treatment, we will call you to review the results.   Testing/Procedures: Echocardiogram - Your physician has requested that you have an echocardiogram. Echocardiography is a painless test that uses sound waves to create images of your heart. It provides your doctor with information about the size and shape of your heart and how well your heart's chambers and valves are working. This procedure takes approximately one hour. There are no restrictions for this procedure. This will be performed at our [facility] location - [address], [address].  Your physician has requested that you have a lower or upper extremity arterial duplex. This test is an ultrasound of the arteries in the legs or arms. It looks at arterial blood flow in the legs and arms. Allow one hour for Lower and Upper Arterial scans. There are no restrictions or special instructions  Lower extremity venous reflux    Follow-Up: At [facility], you and your health needs are our priority.  As part of our continuing mission to provide you with exceptional heart care, we have created designated Provider Care Teams.  These Care Teams include your primary Cardiologist (physician) and Advanced Practice Providers (APPs -  Physician Assistants and Nurse Practitioners) who all work together to provide you with the care you need, when you need it.  We recommend signing up for the patient portal called "MyChart".  Sign up information is provided on this After Visit Summary.   MyChart is used to connect with patients for Virtual Visits (Telemedicine).  Patients are able to view lab/test results, encounter notes, upcoming appointments, etc.  Non-urgent messages can be sent to your provider as well.   To learn more about what you can do with MyChart, go to [email or web address].    Your next appointment:   6 week(s)  The format for your next appointment:   In Person  Provider:   Lennie Odor, MD   Other Instructions Referral to Dr.Ellison- they will contact you to set up an appointment

## 2020-03-02 LAB — BRAIN NATRIURETIC PEPTIDE: BNP: 5.6 pg/mL (ref 0.0–100.0)

## 2020-03-04 ENCOUNTER — Other Ambulatory Visit (HOSPITAL_COMMUNITY): Payer: Self-pay | Admitting: Cardiovascular Disease

## 2020-03-04 ENCOUNTER — Encounter: Payer: Self-pay | Admitting: *Deleted

## 2020-03-04 DIAGNOSIS — M79605 Pain in left leg: Secondary | ICD-10-CM

## 2020-03-04 DIAGNOSIS — M79604 Pain in right leg: Secondary | ICD-10-CM

## 2020-03-07 ENCOUNTER — Telehealth: Payer: Self-pay | Admitting: Cardiovascular Disease

## 2020-03-07 NOTE — Telephone Encounter (Signed)
Left message for patient to call and discuss the pulmonary consult appointment ordered by Dr. Flora Lipps.

## 2020-03-07 NOTE — Telephone Encounter (Signed)
Pt c/o medication issue:  1. Name of Medication: spironolactone (ALDACTONE) 25 MG tablet, amLODipine (NORVASC) 5 MG tablet   2. How are you currently taking this medication (dosage and times per day)? Pt not taking   3. Are you having a reaction (difficulty breathing--STAT)? no  4. What is your medication issue? Patient wanted to confirm that she is supposed to stop these medications. Please advise

## 2020-03-07 NOTE — Telephone Encounter (Signed)
Patient calling to clarify what medications were stopped at her recent OV with Dr. Flora Lipps.   Per patients recent office visit on 03/01/20 with Dr. Flora Lipps-   Primary hypertension -Stop amlodipine.  Stop Aldactone.  Lasix 20 mg daily.  Patient made aware and patient verbalized understanding.

## 2020-03-08 ENCOUNTER — Ambulatory Visit (HOSPITAL_COMMUNITY)
Admission: RE | Admit: 2020-03-08 | Discharge: 2020-03-08 | Disposition: A | Discharge status: Home / Self Care | Payer: 59 | Source: Ambulatory Visit | Attending: Cardiology | Admitting: Cardiology

## 2020-03-08 ENCOUNTER — Other Ambulatory Visit: Payer: Self-pay

## 2020-03-08 DIAGNOSIS — R600 Localized edema: Secondary | ICD-10-CM | POA: Diagnosis present

## 2020-03-10 ENCOUNTER — Telehealth: Payer: Self-pay | Admitting: Cardiovascular Disease

## 2020-03-10 NOTE — Telephone Encounter (Signed)
Spoke with patient regarding scheduled pulmonary consult on 04/26/20 at 9:00 am with De. Everardo All at [facility]--[address] , [address]--phone [phone].  Will mail information to patient and she voiced her understanding.

## 2020-03-14 ENCOUNTER — Other Ambulatory Visit: Payer: Self-pay

## 2020-03-14 ENCOUNTER — Ambulatory Visit (HOSPITAL_COMMUNITY)
Admission: RE | Admit: 2020-03-14 | Discharge: 2020-03-14 | Disposition: A | Discharge status: Home / Self Care | Payer: 59 | Source: Ambulatory Visit | Attending: Cardiology | Admitting: Cardiology

## 2020-03-14 DIAGNOSIS — M79604 Pain in right leg: Secondary | ICD-10-CM | POA: Diagnosis present

## 2020-03-14 DIAGNOSIS — M79605 Pain in left leg: Secondary | ICD-10-CM | POA: Diagnosis present

## 2020-03-14 DIAGNOSIS — R600 Localized edema: Secondary | ICD-10-CM | POA: Diagnosis present

## 2020-03-17 ENCOUNTER — Encounter: Payer: Self-pay | Admitting: Registered"

## 2020-03-17 ENCOUNTER — Encounter: Payer: 59 | Attending: Internal Medicine | Admitting: Registered"

## 2020-03-17 ENCOUNTER — Other Ambulatory Visit: Payer: Self-pay

## 2020-03-17 DIAGNOSIS — Z713 Dietary counseling and surveillance: Secondary | ICD-10-CM | POA: Insufficient documentation

## 2020-03-17 NOTE — Patient Instructions (Signed)
-   Aim to eat 3 meals day.   - Aim to have balanced meals: 1/2 plate of non-starchy vegetables, 1/4 plate of starch/grain, and 1/4 plate of lean protein for lunch and dinner.   - Aim to eat/drink within 1-2 hours of waking up.

## 2020-03-17 NOTE — Progress Notes (Signed)
Medical Nutrition Therapy  Appointment Start time:  2:10  Appointment End time:  3:07  Primary concerns today: prediabetes and diet plan to help with weight loss. Wants to feel better and move around better.  Referral diagnosis: obesity Preferred learning style: no preference indicated Learning readiness: contemplating, ready, change in progress   NUTRITION ASSESSMENT   States she is 100 lbs overweight and prediabetic. States she has not been taking care of herself or watching what she eats. States she has decreased soda intake and replacing with seltzer water.   States her dad, uncles, aunts, and grandmother have/had diabetes. States her father's side of the family are "fatties". States she likes carbohydrates and recently likes juice. Reports she doesn't want to be diagnosed with diabetes. States her husband hates fat women. Told her "its going to be hard for her to get through the door soon". States her sister gives her a hard time about her weight and what she eats. States this makes her not want to be around her sister or husband.   States she has back and knee problems causing limited physical abilities.   States her close friends passed away and doesn't have any friends now. Reports she used to participate in water aerobics and walk more often prior to their deaths years ago. States she has a therapist with [facility]; sees once every 2 months. States she is seeing a cardiologist due to leg swelling.   States she has 5 grandchildren who live in [place]. States she sees them on most special occasions. States it has been challenging the last few years with change in family relationships.   States she used to participated in [employer] for the last 40 years. States she is having her home remodeled and life has been hectic. States she is struggling financially to pay for things right now.    Clinical Medical Hx: sarcoidosis, IBS, prediabetes,  hypothyroidism Medications: See list Labs: elevated A1C (6.3) Notable Signs/Symptoms: none reported  Lifestyle & Dietary Hx   Estimated daily fluid intake: ~52 oz Supplements: See list Sleep: 3-4 hrs at night and will nap throughout the day Stress / self-care: none reported Current average weekly physical activity: none reported  24-Hr Dietary Recall First Meal:  Snack:  Second Meal: Snack:  Third Meal (6 pm): Mario's-2 slices of meatlover's pizza Snack:  Beverages: coffee (2*8 oz; 16 oz), water (3*8 oz; 24 oz), mango juice (2*6 oz; 12 oz); 52 oz  Estimated Energy Needs Calories: 1600 Carbohydrate: 180g Protein: 120g Fat: 44g   NUTRITION DIAGNOSIS  NB-1.1 Food and nutrition-related knowledge deficit As related to prediabetes.  As evidenced by verbalized incomplete knowledge.   NUTRITION INTERVENTION  Nutrition education (E-1) on the following topics: Pt was educated and counseled on metabolism, importance of not skipping meals, effects of dieting/restriction, how carbohydrates work in the body, A1c ranges for prediabetes/diabetes, role of fiber in eating regimen, and importance of physical activity. Discussed meal/snack planning and how to balance meals/snacks using MyPlate for prediabetes. Pt was in agreement with goals listed.  Handouts Provided Include   MyPlate for Prediabetes  Learning Style & Readiness for Change Teaching method utilized: Visual & Auditory  Demonstrated degree of understanding via: Teach Back  Barriers to learning/adherence to lifestyle change: contemplative stage of change  Goals Established by Pt  Aim to eat 3 meals day.   Aim to have balanced meals: 1/2 plate of non-starchy vegetables, 1/4 plate of starch/grain, and 1/4 plate of lean protein for lunch  and dinner.   Aim to eat/drink within 1-2 hours of waking up.    MONITORING & EVALUATION Dietary intake, weekly physical activity in 1 month.  Next Steps  Patient is to follow-up in 1  month.

## 2020-03-24 ENCOUNTER — Encounter: Payer: Self-pay | Admitting: Physician Assistant

## 2020-03-24 ENCOUNTER — Ambulatory Visit (INDEPENDENT_AMBULATORY_CARE_PROVIDER_SITE_OTHER): Payer: 59 | Admitting: Physician Assistant

## 2020-03-24 ENCOUNTER — Other Ambulatory Visit: Payer: Self-pay

## 2020-03-24 DIAGNOSIS — F319 Bipolar disorder, unspecified: Secondary | ICD-10-CM

## 2020-03-24 DIAGNOSIS — G4700 Insomnia, unspecified: Secondary | ICD-10-CM

## 2020-03-24 DIAGNOSIS — F411 Generalized anxiety disorder: Secondary | ICD-10-CM

## 2020-03-24 MED ORDER — ESCITALOPRAM OXALATE 20 MG PO TABS: 30.0000 mg | ORAL_TABLET | Freq: Every day | ORAL | 3 refills | Status: DC

## 2020-03-24 MED ORDER — CLONAZEPAM 1 MG PO TABS: 1.0000 mg | ORAL_TABLET | Freq: Two times a day (BID) | ORAL | 3 refills | Status: DC | PRN

## 2020-03-24 MED ORDER — DIVALPROEX SODIUM ER 500 MG PO TB24: 1000.0000 mg | ORAL_TABLET | Freq: Every day | ORAL | 3 refills | Status: DC

## 2020-03-24 NOTE — Progress Notes (Signed)
[facility]  Patient ID: Latoya Clark,  MRN: [id number]  PCP: Georgann Housekeeper, MD  Date of Evaluation: 03/24/2020 Time spent:30 minutes  Chief Complaint:  Chief Complaint    Anxiety; Depression; Insomnia      HISTORY/CURRENT STATUS: HPI For routine med check.   Husband has been let go from his job just 2 days ago. Has been having swelling in legs bilat, has seen cardiologist; tests are ongoing. "At least I don't have blood clots in my legs. They know that."   She is able to enjoy things when her health lets her.  Energy and motivation are low because of her health issues.  Appetite and weight are at the same, not isolating, does not cry easily, ADLs are normal, no suicidal or homicidal thoughts.  Anxiety is controlled with the Klonopin.  Not having panic attacks as much as she is in general sense of unease.  Patient denies increased energy with decreased need for sleep, no increased talkativeness, no racing thoughts, no impulsivity or risky behaviors, no increased spending, no increased libido, no grandiosity, no increased irritability or anger, and no hallucinations.  Denies dizziness, syncope, seizures, numbness, tingling, tremor, tics, unsteady gait, slurred speech, confusion.   Individual Medical History/ Review of Systems: Changes? :Yes  is seeing a Cardiologist, Dr Flora Lipps.  Past medications for mental health diagnoses include: VPA, Lexapro, Wellbutrin, Traz, Klonopin, Zoloft, Prozac, Ativan, Xanax, Lamictal caused a rash.  Allergies: Lamictal [lamotrigine], Lisinopril, Morphine and related, Prednisone, and Codeine  Current Medications:  Current Outpatient Medications:  .  albuterol (VENTOLIN HFA) 108 (90 Base) MCG/ACT inhaler, 2 puffs as needed, Disp: , Rfl:  .  cholecalciferol (VITAMIN D) 1000 units tablet, Take 1,000 Units by mouth daily., Disp: , Rfl:  .  losartan (COZAAR) 50 MG tablet, Take 50 mg by mouth daily., Disp: , Rfl: 11 .  metoprolol  succinate (TOPROL-XL) 25 MG 24 hr tablet, TAKE ONE TABLET BY MOUTH DAILY, Disp: , Rfl:  .  Multiple Vitamin (MULTIVITAMIN) tablet, Take 1 tablet by mouth at bedtime., Disp: , Rfl:  .  SYNTHROID 137 MCG tablet, Take 137 mcg by mouth daily., Disp: , Rfl: 6 .  traMADol (ULTRAM) 50 MG tablet, Take by mouth., Disp: , Rfl:  .  traZODone (DESYREL) 100 MG tablet, TAKE ONE TO TWO TABLETS BY MOUTH EVERY NIGHT AT BEDTIME AS NEEDED FOR SLEEP, Disp: 60 tablet, Rfl: 2 .  atorvastatin (LIPITOR) 80 MG tablet, Take 80 mg by mouth daily., Disp: , Rfl:  .  clonazePAM (KLONOPIN) 1 MG tablet, Take 1 tablet (1 mg total) by mouth 2 (two) times daily as needed for anxiety., Disp: 60 tablet, Rfl: 3 .  divalproex (DEPAKOTE ER) 500 MG 24 hr tablet, Take 2 tablets (1,000 mg total) by mouth daily., Disp: 60 tablet, Rfl: 3 .  escitalopram (LEXAPRO) 20 MG tablet, Take 1.5 tablets (30 mg total) by mouth daily., Disp: 45 tablet, Rfl: 3 .  famotidine (PEPCID) 20 MG tablet, Take 1 tablet (20 mg total) by mouth 2 (two) times daily. (Patient not taking: Reported on 03/24/2020), Disp: , Rfl:  .  furosemide (LASIX) 20 MG tablet, Take 1 tablet (20 mg total) by mouth daily. (Patient not taking: Reported on 03/24/2020), Disp: 90 tablet, Rfl: 3 .  metroNIDAZOLE (FLAGYL) 500 MG tablet, Take 1 tablet (500 mg total) by mouth 2 (two) times daily. (Patient not taking: No sig reported), Disp: 14 tablet, Rfl: 0 .  nitroGLYCERIN (NITROSTAT) 0.4 MG SL tablet, Place under the  tongue. (Patient not taking: No sig reported), Disp: , Rfl:  .  Omega-3 Fatty Acids (FISH OIL PO), Take 1 capsule by mouth at bedtime.  (Patient not taking: No sig reported), Disp: , Rfl:  Medication Side Effects: none  Family Medical/ Social History: Changes? No  MENTAL HEALTH EXAM:  There were no vitals taken for this visit.There is no height or weight on file to calculate BMI.  General Appearance: Casual, Neat, Well Groomed and Obese  Eye Contact:  Good  Speech:  Normal  Rate  Volume:  Normal  Mood:  Euthymic  Affect:  Appropriate  Thought Process:  Goal Directed and Descriptions of Associations: Intact  Orientation:  Full (Time, Place, and Person)  Thought Content: Logical   Suicidal Thoughts:  No  Homicidal Thoughts:  No  Memory:  WNL  Judgement:  Good  Insight:  Good  Psychomotor Activity:  Walks slowly with a cane, due to the knee pain.  Concentration:  Concentration: Good  Recall:  Good  Fund of Knowledge: Good  Language: Good  Assets:  Desire for Improvement  ADL's:  Intact  Cognition: WNL  Prognosis:  Good   Most recent pertinent labs: 12/18/2019 CMP LFTs nl CBC completely normal specifically platelets 290   DIAGNOSES:    ICD-10-CM   1. Bipolar I disorder (HCC)  F31.9   2. Generalized anxiety disorder  F41.1   3. Insomnia, unspecified type  G47.00     Receiving Psychotherapy: No    RECOMMENDATIONS:  PDMP was reviewed.  I provided 30 minutes of face-to-face time during this encounter including time spent before and after the appointment in record review and charting. She has an order for Depakote level to be drawn.  I again stressed the importance of this test. Continue Klonopin 1 mg, 1 p.o. twice daily as needed. Continue Depakote ER 500 mg, 2 p.o. nightly. Continue Lexapro 20 mg, 1.5 pills daily. Continue trazodone 100 mg, 1-2 nightly as needed sleep. Return in 4 months.  Melony Overly, PA-C

## 2020-03-25 ENCOUNTER — Ambulatory Visit (HOSPITAL_COMMUNITY): Payer: 59

## 2020-04-21 ENCOUNTER — Ambulatory Visit: Payer: 59 | Admitting: Registered"

## 2020-04-22 ENCOUNTER — Ambulatory Visit (HOSPITAL_COMMUNITY): Payer: 59 | Attending: Cardiology

## 2020-04-22 ENCOUNTER — Other Ambulatory Visit: Payer: Self-pay

## 2020-04-22 DIAGNOSIS — R0602 Shortness of breath: Secondary | ICD-10-CM | POA: Diagnosis present

## 2020-04-22 LAB — ECHOCARDIOGRAM COMPLETE
Area-P 1/2: 2.91 cm2
S' Lateral: 2.7 cm

## 2020-04-26 ENCOUNTER — Ambulatory Visit (INDEPENDENT_AMBULATORY_CARE_PROVIDER_SITE_OTHER): Payer: 59 | Admitting: Pulmonary Disease

## 2020-04-26 ENCOUNTER — Encounter: Payer: Self-pay | Admitting: Pulmonary Disease

## 2020-04-26 ENCOUNTER — Other Ambulatory Visit: Payer: Self-pay

## 2020-04-26 VITALS — BP 114/84 | HR 94 | Temp 98.0°F | Ht 63.0 in | Wt 242.0 lb

## 2020-04-26 DIAGNOSIS — R059 Cough, unspecified: Secondary | ICD-10-CM

## 2020-04-26 DIAGNOSIS — R062 Wheezing: Secondary | ICD-10-CM | POA: Diagnosis not present

## 2020-04-26 DIAGNOSIS — R0602 Shortness of breath: Secondary | ICD-10-CM

## 2020-04-26 MED ORDER — BREO ELLIPTA 200-25 MCG/INH IN AEPB: 1.0000 | INHALATION_SPRAY | Freq: Every day | RESPIRATORY_TRACT | 0 refills | Status: DC

## 2020-04-26 NOTE — Patient Instructions (Addendum)
Shortness of breath, cough, wheezing Likely multifactorial with underlying lung disease (COPD, sarcoid), weight gain, home construction.  --Obtain pulmonary function tests --START Breo 200-25 mcg ONE puff daily. This is a two week sample. Please call our office if you wish to continue this medication  Tobacco Use --Refer for lung cancer screening clinic with NP Groce  Follow-up with me in 3 months

## 2020-04-26 NOTE — Progress Notes (Signed)
Subjective:   PATIENT ID: Latoya Clark GENDER: female DOB: 1960-10-11, MRN: [id number]   HPI  Chief Complaint  Patient presents with  . Consult    SOB with exertion for 36 years. Dx with sarcoid in 1985    Reason for Visit: New consult for shortness of breath, hx sarcoid  Latoya Clark is a 60 year old female active smoker with remote history of sarcoid, DM2 and HTN who presents as a new consult.  She has been referred to Pulmonary by her Cardiologist for further evaluation. She was seen by Dr. Flora Lipps on 03/01/20. Per note, she has extensive cardiac history and has had work-up at [facility]. She was also seen by Pulmonary at [facility] for cough, reported restrictive lung disease.  She has long standing history shortness of breath with exertion when walking. More noticeable when outside. She has unproductive coughing usually in the mornings and wheezing around bedtime. She is still able to do activities including lawncare but she is not able to walk her dogs and be as active as she was before. She has albuterol at home but does not use it. Currently having her house remodeled and has been ongoing for the last five months. She also reports weight gain >20lb in the last year.  She was diagnosed with sarcoid in 1985. Family history of sarcoid (paternal side: cousin, aunt). Not a smoker at the time of diagnosis. She reports a dyed scan but no formal tissue biopsy. She was treated with high dose steroids for two months however discontinued due to restlessness and agitation.   Social History: Smoking for 43 years. Was previously 1ppd. 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  Past Medical History:  Diagnosis Date  . Active smoker    1 PPD  . Anxiety   . Arthritis   . Colitis   .  Depression   . Diverticulitis   . History of IBS   . Hyperlipidemia   . Hypertension   . Insomnia   . Mastodynia   . Osteopenia 03/2017   T score -1.6 FRAX 5.2%/0.2% stable from prior DEXA  . Sarcoid   . Thyroid disease   . UTI (lower urinary tract infection)   . Vaginal septum      Family History  Problem Relation Age of Onset  . Depression Mother   . Anxiety disorder Mother   . Depression Father   . Cancer Father        angioplasty thyroid carcenoma  . Diabetes Father   . Hypertension Father   . Thyroid disease Father   . Bipolar disorder Sister   . Hypertension Sister   . Schizophrenia Maternal Grandfather   . Depression Maternal Grandmother   . Anxiety disorder Maternal Grandmother   . Anxiety disorder Paternal Grandfather   . Depression Paternal Grandfather   . Diabetes Paternal Grandfather   . Heart disease Paternal Grandfather   . Anxiety disorder Paternal Grandmother   . Depression Paternal Grandmother   . Cancer Paternal Grandmother        stomach  . Diabetes Paternal Grandmother   . COPD Other   . Hyperlipidemia Other      Social History   Occupational History  . Occupation: disabled  Tobacco Use  . Smoking status: Current Every Day Smoker    Packs/day: 0.75  Years: 20.00    Pack years: 15.00    Types: Cigarettes  . Smokeless tobacco: Never Used  . Tobacco comment: occ alcohol  Vaping Use  . Vaping Use: Never used  Substance and Sexual Activity  . Alcohol use: Yes    Alcohol/week: 0.0 standard drinks    Comment: Rare  . Drug use: No  . Sexual activity: Not Currently    Allergies  Allergen Reactions  . Lamictal [Lamotrigine] Itching  . Lisinopril Other (See Comments)  . Morphine And Related Other (See Comments)    Headaches   . Prednisone Other (See Comments)    Widespread pain throughout body  . Codeine Other (See Comments)    Must eat if taking codeine or pre-treat with a med for nausea     Outpatient Medications Prior to  Visit  Medication Sig Dispense Refill  . albuterol (VENTOLIN HFA) 108 (90 Base) MCG/ACT inhaler 2 puffs as needed    . atorvastatin (LIPITOR) 80 MG tablet Take 80 mg by mouth daily.    . cholecalciferol (VITAMIN D) 1000 units tablet Take 1,000 Units by mouth daily.    . clonazePAM (KLONOPIN) 1 MG tablet Take 1 tablet (1 mg total) by mouth 2 (two) times daily as needed for anxiety. 60 tablet 3  . divalproex (DEPAKOTE ER) 500 MG 24 hr tablet Take 2 tablets (1,000 mg total) by mouth daily. 60 tablet 3  . escitalopram (LEXAPRO) 20 MG tablet Take 1.5 tablets (30 mg total) by mouth daily. 45 tablet 3  . famotidine (PEPCID) 20 MG tablet Take 1 tablet (20 mg total) by mouth 2 (two) times daily. (Patient not taking: Reported on 03/24/2020)    . furosemide (LASIX) 20 MG tablet Take 1 tablet (20 mg total) by mouth daily. (Patient not taking: Reported on 03/24/2020) 90 tablet 3  . losartan (COZAAR) 50 MG tablet Take 50 mg by mouth daily.  11  . metoprolol succinate (TOPROL-XL) 25 MG 24 hr tablet TAKE ONE TABLET BY MOUTH DAILY    . metroNIDAZOLE (FLAGYL) 500 MG tablet Take 1 tablet (500 mg total) by mouth 2 (two) times daily. (Patient not taking: No sig reported) 14 tablet 0  . Multiple Vitamin (MULTIVITAMIN) tablet Take 1 tablet by mouth at bedtime.    . nitroGLYCERIN (NITROSTAT) 0.4 MG SL tablet Place under the tongue. (Patient not taking: No sig reported)    . Omega-3 Fatty Acids (FISH OIL PO) Take 1 capsule by mouth at bedtime.  (Patient not taking: No sig reported)    . SYNTHROID 137 MCG tablet Take 137 mcg by mouth daily.  6  . traMADol (ULTRAM) 50 MG tablet Take by mouth.    . traZODone (DESYREL) 100 MG tablet TAKE ONE TO TWO TABLETS BY MOUTH EVERY NIGHT AT BEDTIME AS NEEDED FOR SLEEP 60 tablet 2   No facility-administered medications prior to visit.    Review of Systems  Constitutional: Negative for chills, diaphoresis, fever, malaise/fatigue and weight loss.  HENT: Negative for congestion, ear  pain and sore throat.   Respiratory: Positive for cough, shortness of breath and wheezing. Negative for hemoptysis and sputum production.   Cardiovascular: Negative for chest pain, palpitations and leg swelling.  Gastrointestinal: Negative for abdominal pain, heartburn and nausea.  Genitourinary: Negative for frequency.  Musculoskeletal: Positive for joint pain. Negative for myalgias.  Skin: Negative for itching and rash.  Neurological: Negative for dizziness, weakness and headaches.  Endo/Heme/Allergies: Positive for environmental allergies. Does not bruise/bleed easily.  Psychiatric/Behavioral: Negative for  depression. The patient is nervous/anxious.      Objective:   Vitals:   04/26/20 0902  BP: 114/84  Pulse: 94  Temp: 98 F (36.7 C)  SpO2: 95%  Weight: 242 lb (109.8 kg)  Height: 5\' 3"  (1.6 m)      Physical Exam: General: Well-appearing, no acute distress HENT: [place], AT Eyes: EOMI, no scleral icterus Respiratory: Clear to auscultation bilaterally.  No crackles, wheezing or rales Cardiovascular: RRR, -M/R/G, no JVD Extremities:-Edema,-tenderness Neuro: AAO x4, CNII-XII grossly intact Skin: Intact, no rashes or bruising Psych: Normal mood, normal affect  Data Reviewed:  Imaging: CT CAP 04/26/17 - Acute diverticulitis of the sigmoid colon. Lungs reviewed with no infiltrate, effusion or edema. Bibasilar and subsegmental atelectasis. No mediastinal adenopathy.  PFT: None on file  Labs:  Absolute eos 10/20/2019 -200 Absolute eos 12/18/2019 - 100  Imaging, labs and test noted above have been reviewed independently by me.    Assessment & Plan:   Discussion:  60 year old female active smoker with remote history of sarcoid, DM2 and HTN who presents with shortness of breath exacerbated by environmental triggers.   Shortness of breath, cough, wheezing Likely multifactorial with underlying lung disease (COPD, sarcoid), weight gain, home construction.  --Obtain  pulmonary function tests --START Breo 200-25 mcg ONE puff daily. This is a two week sample. Please call our office if you wish to continue this medication  Hx of sarcoid  No formal tissue diagnosis --No indication for steroids --Pulmonary function test as above  Tobacco Use --Refer for lung cancer screening clinic with NP Groce --Discussed smoking cessation <3 minutes  Health Maintenance Immunization History  Administered Date(s) Administered  . Influenza Split 11/01/2011  . PFIZER(Purple Top)SARS-COV-2 Vaccination 02/02/2020   CT Lung Screen - refer to lung screening  Orders Placed This Encounter  Procedures  . CT CHEST LUNG CA SCREEN LOW DOSE W/O CM    Standing Status:   Future    Standing Expiration Date:   04/26/2021    Order Specific Question:   Reason for Exam (SYMPTOM  OR DIAGNOSIS REQUIRED)    Answer:   tobacco use    Order Specific Question:   Preferred Imaging Location?    Answer:   [facility].  . Pulmonary function test    Standing Status:   Future    Standing Expiration Date:   04/26/2021    Order Specific Question:   Where should this test be performed?    Answer:   [facility]    Order Specific Question:   Full PFT: includes the following: basic spirometry, spirometry pre & post bronchodilator, diffusion capacity (DLCO), lung volumes    Answer:   Full PFT   Meds ordered this encounter  Medications  . fluticasone furoate-vilanterol (BREO ELLIPTA) 200-25 MCG/INH AEPB    Sig: Inhale 1 puff into the lungs daily.    Dispense:  1 each    Refill:  0    Order Specific Question:   Lot Number?    Answer:   04/28/2021    Order Specific Question:   Expiration Date?    Answer:   10/07/2020    Return in about 3 months (around 07/26/2020).  I have spent a total time of 45-minutes on the day of the appointment reviewing prior documentation, coordinating care and discussing medical diagnosis and plan with the patient/family. Imaging, labs and tests included in  this note have been reviewed and interpreted independently by me.  Rylee Huestis 07/28/2020,  MD [facility] 04/26/2020 8:38 AM  Office Number [phone]

## 2020-04-27 ENCOUNTER — Other Ambulatory Visit: Payer: Self-pay | Admitting: *Deleted

## 2020-04-27 DIAGNOSIS — F17210 Nicotine dependence, cigarettes, uncomplicated: Secondary | ICD-10-CM

## 2020-04-29 ENCOUNTER — Encounter: Payer: Self-pay | Admitting: Pulmonary Disease

## 2020-04-29 DIAGNOSIS — R0602 Shortness of breath: Secondary | ICD-10-CM | POA: Insufficient documentation

## 2020-05-02 NOTE — Progress Notes (Signed)
Cardiology Office Note:   Date:  05/03/2020  NAME:  Latoya Clark    MRN: [id number] DOB:  1960/03/15   PCP:  Georgann Housekeeper, MD  Cardiologist:  No primary care provider on file.  Electrophysiologist:  None   Referring MD: Georgann Housekeeper, MD   Chief Complaint  Patient presents with  . Follow-up    6 weeks.  Marland Kitchen Headache  . Shortness of Breath  . Edema   History of Present Illness:   Latoya Clark is a 60 y.o. female with a hx of obesity, HTN, non-obstructive CAD, restrictive lung disease, HLD who presents for follow-up. Echo normal. Collapsing IVC. ABIs normal. Venous reflux study negative. Norvasc stopped at last visit. BNP 5.6. All of her cardiac testing has been unremarkable.  Her lower extremity edema has improved slightly with stopping amlodipine.  I suspect her symptoms are related to obesity as well as deconditioning.  She also has restrictive lung disease with plans to repeat pulmonary function testing.  Her echocardiogram is inconsistent with congestive heart failure.  She has no evidence of right heart failure with a collapsing IVC.  Her symptoms likely are just related to weight.  She is still short of breath with activity.  Nonobstructive CAD on a coronary CTA at [facility] in 2020.  Cardiac work-up again is unremarkable.  She denies any chest pain or chest tightness.  She reports she was recently placed on prednisone.  She has noticed some swelling with this.  She also reports some redness.  Problem List 1. HTN 2.  Obesity -BMI 43 3.  Nonobstructive CAD -LAD/RCA 25 to 49% -Coronary CTA for 04/2018 [facility] 4.  Prediabetes -A1c 6.3 5.  Hyperlipidemia -Total cholesterol 142, HDL 52, LDL 71, triglycerides 103 6.  Sarcoidosis 7.  Restrictive lung disease  Past Medical History: Past Medical History:  Diagnosis Date  . Active smoker    1 PPD  . Anxiety   . Arthritis   . Colitis   . Depression   . Diverticulitis   . History of IBS   . Hyperlipidemia   .  Hypertension   . Insomnia   . Mastodynia   . Osteopenia 03/2017   T score -1.6 FRAX 5.2%/0.2% stable from prior DEXA  . Sarcoid   . Thyroid disease   . UTI (lower urinary tract infection)   . Vaginal septum     Past Surgical History: Past Surgical History:  Procedure Laterality Date  . CESAREAN SECTION  1981, 1984  . CHOLECYSTECTOMY N/A 01/05/2013   Procedure: LAPAROSCOPIC CHOLECYSTECTOMY WITH attempted INTRAOPERATIVE CHOLANGIOGRAM;  Surgeon: Atilano Ina, MD;  Location: [facility] OR;  Service: General;  Laterality: N/A;  . COPD    . HYSTEROSCOPY  02/13/07   HYSTEROSCOPY, D&C, MIRENA INSERTED  . REPLACEMENT TOTAL KNEE Right 05/12  . THYROIDECTOMY  2002  . TONSILLECTOMY      Current Medications: Current Meds  Medication Sig  . albuterol (VENTOLIN HFA) 108 (90 Base) MCG/ACT inhaler 2 puffs as needed  . atorvastatin (LIPITOR) 80 MG tablet Take 80 mg by mouth daily.  . cholecalciferol (VITAMIN D) 1000 units tablet Take 1,000 Units by mouth daily.  . clonazePAM (KLONOPIN) 1 MG tablet Take 1 tablet (1 mg total) by mouth 2 (two) times daily as needed for anxiety.  . divalproex (DEPAKOTE ER) 500 MG 24 hr tablet Take 2 tablets (1,000 mg total) by mouth daily.  Marland Kitchen escitalopram (LEXAPRO) 20 MG tablet Take 1.5 tablets (30 mg total) by mouth  daily.  . famotidine (PEPCID) 20 MG tablet Take 1 tablet (20 mg total) by mouth 2 (two) times daily.  . fluticasone furoate-vilanterol (BREO ELLIPTA) 200-25 MCG/INH AEPB Inhale 1 puff into the lungs daily.  . furosemide (LASIX) 20 MG tablet Take 1 tablet (20 mg total) by mouth daily.  Marland Kitchen losartan (COZAAR) 50 MG tablet Take 50 mg by mouth daily.  . metoprolol succinate (TOPROL-XL) 25 MG 24 hr tablet TAKE ONE TABLET BY MOUTH DAILY  . metroNIDAZOLE (FLAGYL) 500 MG tablet Take 1 tablet (500 mg total) by mouth 2 (two) times daily.  . Multiple Vitamin (MULTIVITAMIN) tablet Take 1 tablet by mouth at bedtime.  . nitroGLYCERIN (NITROSTAT) 0.4 MG SL tablet Place under  the tongue.  . Omega-3 Fatty Acids (FISH OIL PO) Take 1 capsule by mouth at bedtime.  Marland Kitchen SYNTHROID 137 MCG tablet Take 137 mcg by mouth daily.  . traMADol (ULTRAM) 50 MG tablet Take by mouth.  . traZODone (DESYREL) 100 MG tablet TAKE ONE TO TWO TABLETS BY MOUTH EVERY NIGHT AT BEDTIME AS NEEDED FOR SLEEP     Allergies:    Lamictal [lamotrigine], Lisinopril, Morphine and related, Prednisone, and Codeine   Social History: Social History   Socioeconomic History  . Marital status: Married    Spouse name: Not on file  . Number of children: 2  . Years of education: Not on file  . Highest education level: Not on file  Occupational History  . Occupation: disabled  Tobacco Use  . Smoking status: Current Every Day Smoker    Packs/day: 0.75    Years: 20.00    Pack years: 15.00    Types: Cigarettes  . Smokeless tobacco: Never Used  . Tobacco comment: 2-3 cigarettes a day  Vaping Use  . Vaping Use: Never used  Substance and Sexual Activity  . Alcohol use: Yes    Alcohol/week: 0.0 standard drinks    Comment: Rare  . Drug use: No  . Sexual activity: Not Currently  Other Topics Concern  . Not on file  Social History Narrative  . Not on file   Social Determinants of Health   Financial Resource Strain: Not on file  Food Insecurity: No Food Insecurity  . Worried About Programme researcher, broadcasting/film/video in the Last Year: Never true  . Ran Out of Food in the Last Year: Never true  Transportation Needs: Not on file  Physical Activity: Not on file  Stress: Not on file  Social Connections: Not on file     Family History: The patient's family history includes Anxiety disorder in her maternal grandmother, mother, paternal grandfather, and paternal grandmother; Bipolar disorder in her sister; COPD in an other family member; Cancer in her father and paternal grandmother; Depression in her father, maternal grandmother, mother, paternal grandfather, and paternal grandmother; Diabetes in her father, paternal  grandfather, and paternal grandmother; Heart disease in her paternal grandfather; Hyperlipidemia in an other family member; Hypertension in her father and sister; Schizophrenia in her maternal grandfather; Thyroid disease in her father.  ROS:   All other ROS reviewed and negative. Pertinent positives noted in the HPI.     EKGs/Labs/Other Studies Reviewed:   The following studies were personally reviewed by me today:  TTE 04/22/2020 1. Left ventricular ejection fraction, by estimation, is 60 to 65%. The  left ventricle has normal function. The left ventricle has no regional  wall motion abnormalities. There is mild concentric left ventricular  hypertrophy. Left ventricular diastolic  parameters are consistent  with Grade I diastolic dysfunction (impaired  relaxation).  2. Right ventricular systolic function is normal. The right ventricular  size is normal. There is normal pulmonary artery systolic pressure. The  estimated right ventricular systolic pressure is 22.4 mmHg.  3. The mitral valve is normal in structure. Trivial mitral valve  regurgitation. No evidence of mitral stenosis.  4. The aortic valve is tricuspid. There is mild calcification of the  aortic valve. Aortic valve regurgitation is not visualized. Mild aortic  valve sclerosis is present, with no evidence of aortic valve stenosis.  5. Aortic dilatation noted. There is mild dilatation of the ascending  aorta, measuring 37 mm.  6. The inferior vena cava is normal in size with greater than 50%  respiratory variability, suggesting right atrial pressure of 3 mmHg.   Vasc US Arterial 03/15/2020 Summary:  Right: Resting right ankle-brachial index is within normal range. No  evidence of significant right lower extremity arterial disease. The right  toe-brachial index is normal.   Left: Resting left ankle-brachial index is within normal range. No  evidence of significant left lower extremity arterial disease. The left   toe-brachial index is normal.   Vasc US LE Reflux 03/09/2020 Summary:  Bilateral:  - No evidence of deep vein thrombosis seen in the lower extremities,  bilaterally, from the common femoral through the popliteal veins.  - No evidence of superficial venous thrombosis in the lower extremities,  bilaterally.  - No evidence of deep venous insufficiency seen bilaterally in the lower  extremity.  - No evidence of superficial venous reflux seen in the greater saphenous  veins bilaterally.  - No evidence of superficial venous reflux seen in the short saphenous  veins bilaterally.   Recent Labs: 03/01/2020: BNP 5.6   Recent Lipid Panel    Component Value Date/Time   CHOL 192 06/08/2008 0902   TRIG 65.0 06/08/2008 0902   HDL 62.00 06/08/2008 0902   CHOLHDL 3 06/08/2008 0902   VLDL 13.0 06/08/2008 0902   LDLCALC 117 (H) 06/08/2008 0902    Physical Exam:   VS:  BP 136/86 (BP Location: Left Arm, Patient Position: Sitting, Cuff Size: Large)   Pulse 97   Ht 5\' 3"  (1.6 m)   Wt 238 lb (108 kg)   BMI 42.16 kg/m    Wt Readings from Last 3 Encounters:  05/03/20 238 lb (108 kg)  04/26/20 242 lb (109.8 kg)  03/01/20 245 lb 3.2 oz (111.2 kg)    General: Well nourished, well developed, in no acute distress Head: Atraumatic, normal size  Eyes: PEERLA, EOMI  Neck: Supple, no JVD Endocrine: No thryomegaly Cardiac: Normal S1, S2; RRR; no murmurs, rubs, or gallops Lungs: Clear to auscultation bilaterally, no wheezing, rhonchi or rales  Abd: Soft, nontender, no hepatomegaly  Ext: No edema, pulses 2+ Musculoskeletal: No deformities, BUE and BLE strength normal and equal Skin: Warm and dry, no rashes   Neuro: Alert and oriented to person, place, time, and situation, CNII-XII grossly intact, no focal deficits  Psych: Normal mood and affect   ASSESSMENT:   Latoya Clark is a 60 y.o. female who presents for the following: 1. Leg edema   2. SOB (shortness of breath) on exertion   3. Primary  hypertension   4. Coronary artery disease involving native coronary artery of native heart without angina pectoris   5. Mixed hyperlipidemia     PLAN:   1. Leg edema 2. SOB (shortness of breath) on exertion -Lower extremity edema with shortness  of breath.  Echocardiogram was normal.  Collapsing IVC.  No evidence of right heart failure.  We did stop her amlodipine with some improvement.  Vascular ultrasounds negative for PAD and no evidence of venous reflux.  Overall suspect her symptoms are strictly related to obesity.  Her shortness of breath also is likely related to restrictive lung disease as well as deconditioning.  Her BNP was negative.  Echo was normal.  Coronary CTA at in [place]  in 2020 showed nonobstructive CAD.  No cardio explanation for her symptoms.  I suggest she continue with exercise and weight loss.  She will also continue to work with pulmonary.  3. Primary hypertension -No change in medications.  4. Coronary artery disease involving native coronary artery of native heart without angina pectoris 5. Mixed hyperlipidemia -Nonobstructive CAD on coronary CTA in [place] in 2020.  She will continue with aspirin and statin medication.  Her most recent LDL cholesterol was 71 which is close to goal.  I would recommend she continue this.  She should also continue with diet and exercise.  Symptoms are not explained by her nonobstructive CAD.  Disposition: Return if symptoms worsen or fail to improve.  Medication Adjustments/Labs and Tests Ordered: Current medicines are reviewed at length with the patient today.  Concerns regarding medicines are outlined above.  No orders of the defined types were placed in this encounter.  No orders of the defined types were placed in this encounter.   Patient Instructions  Medication Instructions:  The current medical regimen is effective;  continue present plan and medications.  *If you need a refill on your cardiac medications before your  next appointment, please call your pharmacy*   Follow-Up: At [facility], you and your health needs are our priority.  As part of our continuing mission to provide you with exceptional heart care, we have created designated Provider Care Teams.  These Care Teams include your primary Cardiologist (physician) and Advanced Practice Providers (APPs -  Physician Assistants and Nurse Practitioners) who all work together to provide you with the care you need, when you need it.  We recommend signing up for the patient portal called "MyChart".  Sign up information is provided on this After Visit Summary.  MyChart is used to connect with patients for Virtual Visits (Telemedicine).  Patients are able to view lab/test results, encounter notes, upcoming appointments, etc.  Non-urgent messages can be sent to your provider as well.   To learn more about what you can do with MyChart, go to [email or web address].    Your next appointment:   As needed  The format for your next appointment:   In Person  Provider:   Lennie OdorWesley O'Neal, MD       Time Spent with Patient: I have spent a total of 25 minutes with patient reviewing hospital notes, telemetry, EKGs, labs and examining the patient as well as establishing an assessment and plan that was discussed with the patient.  > 50% of time was spent in direct patient care.  Signed, Lenna GilfordWesley T. Flora Lipps'Neal, MD, [facility]  [facility]  [address], [address] [place], [place] [address] [phone]  05/03/2020 4:14 PM

## 2020-05-03 ENCOUNTER — Other Ambulatory Visit: Payer: Self-pay

## 2020-05-03 ENCOUNTER — Ambulatory Visit (INDEPENDENT_AMBULATORY_CARE_PROVIDER_SITE_OTHER): Payer: 59 | Admitting: Cardiovascular Disease

## 2020-05-03 ENCOUNTER — Encounter: Payer: Self-pay | Admitting: Cardiovascular Disease

## 2020-05-03 VITALS — BP 136/86 | HR 97 | Ht 63.0 in | Wt 238.0 lb

## 2020-05-03 DIAGNOSIS — E782 Mixed hyperlipidemia: Secondary | ICD-10-CM

## 2020-05-03 DIAGNOSIS — I2510 Atherosclerotic heart disease of native coronary artery without angina pectoris: Secondary | ICD-10-CM

## 2020-05-03 DIAGNOSIS — I10 Essential (primary) hypertension: Secondary | ICD-10-CM

## 2020-05-03 DIAGNOSIS — R0602 Shortness of breath: Secondary | ICD-10-CM

## 2020-05-03 DIAGNOSIS — R600 Localized edema: Secondary | ICD-10-CM

## 2020-05-03 NOTE — Patient Instructions (Signed)
Medication Instructions:  The current medical regimen is effective;  continue present plan and medications.  *If you need a refill on your cardiac medications before your next appointment, please call your pharmacy*    Follow-Up: At [facility], you and your health needs are our priority.  As part of our continuing mission to provide you with exceptional heart care, we have created designated Provider Care Teams.  These Care Teams include your primary Cardiologist (physician) and Advanced Practice Providers (APPs -  Physician Assistants and Nurse Practitioners) who all work together to provide you with the care you need, when you need it.  We recommend signing up for the patient portal called "MyChart".  Sign up information is provided on this After Visit Summary.  MyChart is used to connect with patients for Virtual Visits (Telemedicine).  Patients are able to view lab/test results, encounter notes, upcoming appointments, etc.  Non-urgent messages can be sent to your provider as well.   To learn more about what you can do with MyChart, go to [email or web address].    Your next appointment:   As needed  The format for your next appointment:   In Person  Provider:   [place] O'Neal, MD      

## 2020-05-13 ENCOUNTER — Ambulatory Visit: Payer: 59

## 2020-06-13 ENCOUNTER — Ambulatory Visit (INDEPENDENT_AMBULATORY_CARE_PROVIDER_SITE_OTHER): Payer: 59 | Admitting: Acute Care

## 2020-06-13 ENCOUNTER — Other Ambulatory Visit: Payer: Self-pay

## 2020-06-13 ENCOUNTER — Encounter: Payer: Self-pay | Admitting: Acute Care

## 2020-06-13 ENCOUNTER — Ambulatory Visit
Admission: RE | Admit: 2020-06-13 | Discharge: 2020-06-13 | Disposition: A | Discharge status: Home / Self Care | Payer: 59 | Source: Ambulatory Visit | Attending: Acute Care | Admitting: Acute Care

## 2020-06-13 DIAGNOSIS — F17210 Nicotine dependence, cigarettes, uncomplicated: Secondary | ICD-10-CM

## 2020-06-13 DIAGNOSIS — R059 Cough, unspecified: Secondary | ICD-10-CM

## 2020-06-13 DIAGNOSIS — R062 Wheezing: Secondary | ICD-10-CM

## 2020-06-13 DIAGNOSIS — R0602 Shortness of breath: Secondary | ICD-10-CM

## 2020-06-13 NOTE — Patient Instructions (Signed)
Thank you for participating in the [facility]. It was our pleasure to meet you today. We will call you with the results of your scan within the next few days. Your scan will be assigned a Lung RADS category score by the physicians reading the scans.  This Lung RADS score determines follow up scanning.  See below for description of categories, and follow up screening recommendations. We will be in touch to schedule your follow up screening annually or based on recommendations of our providers. We will fax a copy of your scan results to your Primary Care Physician, or the physician who referred you to the program, to ensure they have the results. Please call the office if you have any questions or concerns regarding your scanning experience or results.  Our office number is [phone]. Please speak with Denise Phelps, RN. She is our Lung Cancer Screening RN. If she is unavailable when you call, please have the office staff send her a message. She will return your call at her earliest convenience. Remember, if your scan is normal, we will scan you annually as long as you continue to meet the criteria for the program. (Age 62-77, Current smoker or smoker who has quit within the last 15 years). If you are a smoker, remember, quitting is the single most powerful action that you can take to decrease your risk of lung cancer and other pulmonary, breathing related problems. We know quitting is hard, and we are here to help.  Please let us know if there is anything we can do to help you meet your goal of quitting. If you are a former smoker, congratulations. We are proud of you! Remain smoke free! Remember you can refer friends or family members through the number above.  We will screen them to make sure they meet criteria for the program. Thank you for helping us take better care of you by participating in Lung Screening.  Lung RADS Categories:  Lung RADS 1: no nodules  or definitely non-concerning nodules.  Recommendation is for a repeat annual scan in 12 months.  Lung RADS 2:  nodules that are non-concerning in appearance and behavior with a very low likelihood of becoming an active cancer. Recommendation is for a repeat annual scan in 12 months.  Lung RADS 3: nodules that are probably non-concerning , includes nodules with a low likelihood of becoming an active cancer.  Recommendation is for a 6-month repeat screening scan. Often noted after an upper respiratory illness. We will be in touch to make sure you have no questions, and to schedule your 6-month scan.  Lung RADS 4 A: nodules with concerning findings, recommendation is most often for a follow up scan in 3 months or additional testing based on our provider's assessment of the scan. We will be in touch to make sure you have no questions and to schedule the recommended 3 month follow up scan.  Lung RADS 4 B:  indicates findings that are concerning. We will be in touch with you to schedule additional diagnostic testing based on our provider's  assessment of the scan.   

## 2020-06-13 NOTE — Progress Notes (Signed)
Shared Decision Making Visit Lung Cancer Screening Program [phone])   Eligibility:  Age 60 y.o.  Pack Years Smoking History Calculation 30 pack year smoking history (# packs/per year x # years smoked)  Recent History of coughing up blood  no  Unexplained weight loss? no ( >Than 15 pounds within the last 6 months )  Prior History Lung / other cancer no (Diagnosis within the last 5 years already requiring surveillance chest CT Scans).  Smoking Status Current Smoker  Former Smokers: Years since quit:NA  Quit Date: NA  Visit Components:  Discussion included one or more decision making aids. yes  Discussion included risk/benefits of screening. yes  Discussion included potential follow up diagnostic testing for abnormal scans. yes  Discussion included meaning and risk of over diagnosis. yes  Discussion included meaning and risk of False Positives. yes  Discussion included meaning of total radiation exposure. yes  Counseling Included:  Importance of adherence to annual lung cancer LDCT screening. yes  Impact of comorbidities on ability to participate in the program. yes  Ability and willingness to under diagnostic treatment. yes  Smoking Cessation Counseling:  Current Smokers:   Discussed importance of smoking cessation. yes  Information about tobacco cessation classes and interventions provided to patient. yes  Patient provided with "ticket" for LDCT Scan. yes  Symptomatic Patient. no  Counseling NA  Diagnosis Code: Tobacco Use Z72.0  Asymptomatic Patient yes  Counseling (Intermediate counseling: > three minutes counseling) O8416  Former Smokers:   Discussed the importance of maintaining cigarette abstinence. yes  Diagnosis Code: Personal History of Nicotine Dependence. S06.301  Information about tobacco cessation classes and interventions provided to patient. Yes  Patient provided with "ticket" for LDCT Scan. yes  Written Order for Lung Cancer  Screening with LDCT placed in Epic. Yes (CT Chest Lung Cancer Screening Low Dose W/O CM) [id number] Z12.2-Screening of respiratory organs Z87.891-Personal history of nicotine dependence  I have spent 25 minutes of face to face time with Ms. Blizzard discussing the risks and benefits of lung cancer screening. We viewed a power point together that explained in detail the above noted topics. We paused at intervals to allow for questions to be asked and answered to ensure understanding.We discussed that the single most powerful action that she can take to decrease her risk of developing lung cancer is to quit smoking. We discussed whether or not she is ready to commit to setting a quit date. We discussed options for tools to aid in quitting smoking including nicotine replacement therapy, non-nicotine medications, support groups, Quit Smart classes, and behavior modification. We discussed that often times setting smaller, more achievable goals, such as eliminating 1 cigarette a day for a week and then 2 cigarettes a day for a week can be helpful in slowly decreasing the number of cigarettes smoked. This allows for a sense of accomplishment as well as providing a clinical benefit. I gave her the " Be Stronger Than Your Excuses" card with contact information for community resources, classes, free nicotine replacement therapy, and access to mobile apps, text messaging, and on-line smoking cessation help. I have also given her my card and contact information in the event she needs to contact me. We discussed the time and location of the scan, and that either Abigail Miyamoto RN or I will call with the results within 24-48 hours of receiving them. I have offered her  a copy of the power point we viewed  as a resource in the event they need reinforcement of  the concepts we discussed today in the office. The patient verbalized understanding of all of  the above and had no further questions upon leaving the office. They have my  contact information in the event they have any further questions.  I spent 3-4  minutes counseling on smoking cessation and the health risks of continued tobacco abuse.  I explained to the patient that there has been a high incidence of coronary artery disease noted on these exams. I explained that this is a non-gated exam therefore degree or severity cannot be determined. This patient is on statin therapy. I have asked the patient to follow-up with their PCP regarding any incidental finding of coronary artery disease and management with diet or medication as their PCP  feels is clinically indicated. The patient verbalized understanding of the above and had no further questions upon completion of the visit.  Pt. 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 She has used Wellbutrin in the past, and is not currently on it.     Bevelyn Ngo, NP 06/13/2020

## 2020-06-21 ENCOUNTER — Telehealth: Payer: Self-pay | Admitting: Acute Care

## 2020-06-21 DIAGNOSIS — F17210 Nicotine dependence, cigarettes, uncomplicated: Secondary | ICD-10-CM

## 2020-06-21 NOTE — Telephone Encounter (Signed)
Spoke with pt regarding CT results. I explained to pt that [facility] has not released the results to [place] yet.  I called and spoke with Diane at [facility] radiology and she states that she will put in a request to have this result expedited to [place].  I called pt back and let her know that I will call her back tomorrow morning with an update.  Pt verbalized understanding. Please leave in my box to follow up on.

## 2020-06-22 ENCOUNTER — Telehealth: Payer: Self-pay | Admitting: Acute Care

## 2020-06-22 NOTE — Telephone Encounter (Signed)
I called the patient with the results of hr scan. There was no answer. I have left a HIPPA compliant message on her VM with the office number asking that she call the office for her results. I will attempt to call the patient again today between patient visits in the clinic.   These results were not available to [place] until 6/14 in the evening.

## 2020-06-22 NOTE — Telephone Encounter (Signed)
LMTC x 1  

## 2020-06-23 NOTE — Progress Notes (Signed)
Please call patient and let them  know their  low dose Ct was read as a Lung RADS 1, negative study: no nodules or definitely benign nodules. Radiology recommendation is for a repeat LDCT in 12 months. .Please let them  know we will order and schedule their  annual screening scan for 06/2021. Please let them  know there was notation of CAD on their  scan.  Please remind the patient  that this is a non-gated exam therefore degree or severity of disease  cannot be determined. Please have them  follow up with their PCP regarding potential risk factor modification, dietary therapy or pharmacologic therapy if clinically indicated. Pt.  is  currently on statin therapy. Please place order for annual  screening scan for  06/2021 and fax results to PCP. Thanks so much. 

## 2020-06-23 NOTE — Progress Notes (Signed)
There was notation of CAD. Patient is on statin therapy. Have them follow up with their PCP.  Also there was notation of mild emphysema , and imaging that is suggestive of COPD.  Let her know the only way to diagnose COPD is with pulmonary function tests. She has an order for PFT's that have not been done. She is followed by Dr. Everardo All in the clinic.

## 2020-06-23 NOTE — Telephone Encounter (Signed)
Patient is returning phone call about CT results. Patient phone number is [phone].

## 2020-06-27 NOTE — Telephone Encounter (Signed)
Pt informed of CT results per Sarah Groce, NP.  PT verbalized understanding.  Copy of CT sent to PCP.  Order placed for 1 yr f/u CT. ° °

## 2020-06-27 NOTE — Telephone Encounter (Signed)
See telephone note dated 06/21/20.

## 2020-07-03 ENCOUNTER — Other Ambulatory Visit: Payer: Self-pay | Admitting: Physician Assistant

## 2020-07-25 ENCOUNTER — Telehealth: Payer: Self-pay | Admitting: Pulmonary Disease

## 2020-07-25 ENCOUNTER — Other Ambulatory Visit: Payer: Self-pay

## 2020-07-25 ENCOUNTER — Ambulatory Visit (INDEPENDENT_AMBULATORY_CARE_PROVIDER_SITE_OTHER): Payer: 59 | Admitting: Pulmonary Disease

## 2020-07-25 ENCOUNTER — Encounter: Payer: Self-pay | Admitting: Pulmonary Disease

## 2020-07-25 VITALS — BP 148/90 | HR 90 | Ht 63.0 in | Wt 221.0 lb

## 2020-07-25 DIAGNOSIS — J441 Chronic obstructive pulmonary disease with (acute) exacerbation: Secondary | ICD-10-CM | POA: Diagnosis not present

## 2020-07-25 DIAGNOSIS — J439 Emphysema, unspecified: Secondary | ICD-10-CM

## 2020-07-25 DIAGNOSIS — R0602 Shortness of breath: Secondary | ICD-10-CM | POA: Diagnosis not present

## 2020-07-25 DIAGNOSIS — J449 Chronic obstructive pulmonary disease, unspecified: Secondary | ICD-10-CM | POA: Diagnosis not present

## 2020-07-25 DIAGNOSIS — R059 Cough, unspecified: Secondary | ICD-10-CM

## 2020-07-25 DIAGNOSIS — R062 Wheezing: Secondary | ICD-10-CM

## 2020-07-25 LAB — PULMONARY FUNCTION TEST
DL/VA % pred: 127 %
DL/VA: 5.42 ml/min/mmHg/L
DLCO unc % pred: 110 %
DLCO unc: 20.68 ml/min/mmHg
FEF 25-75 Post: 1.46 L/sec
FEF 25-75 Pre: 1.25 L/sec
FEF2575-%Change-Post: 16 %
FEF2575-%Pred-Post: 67 %
FEF2575-%Pred-Pre: 58 %
FEV1-%Change-Post: 4 %
FEV1-%Pred-Post: 59 %
FEV1-%Pred-Pre: 56 %
FEV1-Post: 1.37 L
FEV1-Pre: 1.32 L
FEV1FVC-%Change-Post: 4 %
FEV1FVC-%Pred-Pre: 101 %
FEV6-%Change-Post: 0 %
FEV6-%Pred-Post: 57 %
FEV6-%Pred-Pre: 57 %
FEV6-Post: 1.66 L
FEV6-Pre: 1.66 L
FEV6FVC-%Change-Post: 0 %
FEV6FVC-%Pred-Post: 103 %
FEV6FVC-%Pred-Pre: 103 %
FVC-%Change-Post: 0 %
FVC-%Pred-Post: 55 %
FVC-%Pred-Pre: 55 %
FVC-Post: 1.66 L
FVC-Pre: 1.67 L
Post FEV1/FVC ratio: 83 %
Post FEV6/FVC ratio: 100 %
Pre FEV1/FVC ratio: 79 %
Pre FEV6/FVC Ratio: 100 %

## 2020-07-25 MED ORDER — PREDNISONE 10 MG PO TABS: 20.0000 mg | ORAL_TABLET | Freq: Every day | ORAL | 0 refills | Status: AC

## 2020-07-25 MED ORDER — HYDROCODONE BIT-HOMATROP MBR 5-1.5 MG/5ML PO SOLN: 5.0000 mL | Freq: Four times a day (QID) | ORAL | 0 refills | Status: DC | PRN

## 2020-07-25 MED ORDER — FLUTICASONE FUROATE-VILANTEROL 200-25 MCG/INH IN AEPB: 1.0000 | INHALATION_SPRAY | Freq: Every day | RESPIRATORY_TRACT | 0 refills | Status: DC

## 2020-07-25 NOTE — Progress Notes (Signed)
PFT done today. 

## 2020-07-25 NOTE — Telephone Encounter (Signed)
Called and spoke with Marchelle Folks (pharmacist at [facility]). She stated that the patient received a prednisone RX from our office today. Per their records, the patient is allergic to prednisone. I advised her to no dispense the prednisone until she hears back from our office. She verbalized understanding.   I reviewed her allergies, prednisone is listed as an allergy. States it causes widespread body pain.   I called the patient to see if I could get more information but she did not answer. I left a message for her to call us back.   Dr. Everardo All, can you please advise about the prednisone?

## 2020-07-25 NOTE — Patient Instructions (Addendum)
Emphysema, mild COPD exacerbation --START Breo 200-25 mcg ONE puff daily. This is your EVERYDAY inhaler --START prednisone 20 mg in the morning for five days --START cough syrup at night. DO NOT TAKE with tramadol.   Follow-up with me in 3 months

## 2020-07-25 NOTE — Progress Notes (Signed)
Subjective:   PATIENT ID: Latoya Clark GENDER: female DOB: 1960-01-12, MRN: [id number]   HPI  Chief Complaint  Patient presents with   Shortness of Breath    Wheezing, congestion, coughing of mucus not using  breo inhaler.    Reason for Visit: New consult for shortness of breath, hx sarcoid  Ms. Latoya Clark is a 60 year old female active smoker with remote history of sarcoid, DM2 and HTN who presents for follow-up.  She has been referred to Pulmonary by her Cardiologist for further evaluation. She was seen by Dr. Flora Lipps on 03/01/20. Per note, she has extensive cardiac history and has had work-up at [facility]. She was also seen by Pulmonary at [facility] for cough, reported restrictive lung disease.  She has long standing history shortness of breath with exertion when walking. More noticeable when outside. She has unproductive coughing usually in the mornings and wheezing around bedtime. She is still able to do activities including lawncare but she is not able to walk her dogs and be as active as she was before. She has albuterol at home but does not use it. Currently having her house remodeled and has been ongoing for the last five months. She also reports weight gain >20lb in the last year.  She was diagnosed with sarcoid in 1985. Family history of sarcoid (paternal side: cousin, aunt). Not a smoker at the time of diagnosis. She reports a dyed scan but no formal tissue biopsy. She was treated with high dose steroids for two months however discontinued due to restlessness and agitation.   07/25/20 She reports that she has shortness of breath, wheezing, cough with occasional white-yellow sputum. She was recently with family/grandchilden two weeks ago who had a viral infection involving their eyes and ears. COVID-19 ag was negative. She felt she kept coughing with the breathing tests.  Her symptoms are intermittently better with bad days. Z-pack and cough syrup didn't improve. Has  been drinking tea and water. She is not smoking at this time as she does not inhaler.  Social History: Smoking for 43 years. Was previously 1ppd. Currently 2-3 cigarettes daily for the last two years Husband recently laid off  Environmental exposures: Recent home construction completed before July  I have personally reviewed patient's past medical/family/social history/allergies/current medications.   Past Medical History:  Diagnosis Date   Active smoker    1 PPD   Anxiety    Arthritis    Colitis    Depression    Diverticulitis    History of IBS    Hyperlipidemia    Hypertension    Insomnia    Mastodynia    Osteopenia 03/2017   T score -1.6 FRAX 5.2%/0.2% stable from prior DEXA   Sarcoid    Thyroid disease    UTI (lower urinary tract infection)    Vaginal septum      Family History  Problem Relation Age of Onset   Depression Mother    Anxiety disorder Mother    Depression Father    Cancer Father        angioplasty thyroid carcenoma   Diabetes Father    Hypertension Father    Thyroid disease Father    Bipolar disorder Sister    Hypertension Sister    Schizophrenia Maternal Grandfather    Depression Maternal Grandmother    Anxiety disorder Maternal Grandmother    Anxiety disorder Paternal Grandfather    Depression Paternal Grandfather    Diabetes Paternal Grandfather  Heart disease Paternal Grandfather    Anxiety disorder Paternal Grandmother    Depression Paternal Grandmother    Cancer Paternal Grandmother        stomach   Diabetes Paternal Grandmother    COPD Other    Hyperlipidemia Other      Social History   Occupational History   Occupation: disabled  Tobacco Use   Smoking status: Every Day    Packs/day: 1.00    Years: 20.00    Pack years: 20.00    Types: Cigarettes   Smokeless tobacco: Never   Tobacco comments:    2-3 cigarettes a day  Vaping Use   Vaping Use: Never used  Substance and Sexual Activity   Alcohol use: Yes     Alcohol/week: 0.0 standard drinks    Comment: Rare   Drug use: No   Sexual activity: Not Currently    Allergies  Allergen Reactions   Lamictal [Lamotrigine] Itching   Lisinopril Other (See Comments)   Morphine And Related Other (See Comments)    Headaches    Penicillin G Other (See Comments)   Prednisone Other (See Comments)    Widespread pain throughout body   Codeine Other (See Comments)    Must eat if taking codeine or pre-treat with a med for nausea     Outpatient Medications Prior to Visit  Medication Sig Dispense Refill   albuterol (VENTOLIN HFA) 108 (90 Base) MCG/ACT inhaler      atorvastatin (LIPITOR) 80 MG tablet Take 80 mg by mouth daily.     cholecalciferol (VITAMIN D) 1000 units tablet Take 1,000 Units by mouth daily.     clonazePAM (KLONOPIN) 1 MG tablet Take 1 tablet (1 mg total) by mouth 2 (two) times daily as needed for anxiety. 60 tablet 3   divalproex (DEPAKOTE ER) 500 MG 24 hr tablet TAKE THREE TABLETS BY MOUTH EVERY NIGHT AT BEDTIME 90 tablet 0   escitalopram (LEXAPRO) 20 MG tablet Take 1.5 tablets (30 mg total) by mouth daily. 45 tablet 3   famotidine (PEPCID) 20 MG tablet Take 1 tablet (20 mg total) by mouth 2 (two) times daily.     losartan (COZAAR) 50 MG tablet Take 50 mg by mouth daily.  11   metoprolol succinate (TOPROL-XL) 25 MG 24 hr tablet TAKE ONE TABLET BY MOUTH DAILY     Multiple Vitamin (MULTIVITAMIN) tablet Take 1 tablet by mouth at bedtime.     nitroGLYCERIN (NITROSTAT) 0.4 MG SL tablet Place under the tongue.     Omega-3 Fatty Acids (FISH OIL PO) Take 1 capsule by mouth at bedtime.     SYNTHROID 137 MCG tablet Take 137 mcg by mouth daily.  6   traMADol (ULTRAM) 50 MG tablet Take by mouth.     traZODone (DESYREL) 100 MG tablet TAKE ONE TO TWO TABLETS BY MOUTH EVERY NIGHT AT BEDTIME AS NEEDED FOR SLEEP 60 tablet 2   fluticasone furoate-vilanterol (BREO ELLIPTA) 200-25 MCG/INH AEPB Inhale 1 puff into the lungs daily. (Patient not taking: No sig  reported) 1 each 0   metroNIDAZOLE (FLAGYL) 500 MG tablet Take 1 tablet (500 mg total) by mouth 2 (two) times daily. (Patient not taking: Reported on 07/25/2020) 14 tablet 0   No facility-administered medications prior to visit.    Review of Systems  Constitutional:  Negative for chills, diaphoresis, fever, malaise/fatigue and weight loss.  HENT:  Negative for congestion.   Respiratory:  Positive for cough, shortness of breath and wheezing. Negative for hemoptysis and  sputum production.   Cardiovascular:  Negative for chest pain, palpitations and leg swelling.    Objective:   Vitals:   07/25/20 1403  BP: (!) 148/90  Pulse: 90  SpO2: 96%  Weight: 221 lb (100.2 kg)  Height: 5\' 3"  (1.6 m)   SpO2: 96 %  Physical Exam: General: Well-appearing, no acute distress HENT: [place], AT Eyes: EOMI, no scleral icterus Respiratory: Clear to auscultation bilaterally.  No crackles, wheezing or rales Cardiovascular: RRR, -M/R/G, no JVD Extremities:-Edema,-tenderness Neuro: AAO x4, CNII-XII grossly intact Psych: Normal mood, normal affect  Data Reviewed:  Imaging: CT CAP 04/26/17 - Acute diverticulitis of the sigmoid colon. Lungs reviewed with no infiltrate, effusion or edema. Bibasilar and subsegmental atelectasis. No mediastinal adenopathy. CT Chest Lung Screen 06/13/20 - Chronic scarring in the lingular, unchanged. Mild centrilobular and paraseptal emphysema. No pulmonary nodules  PFT: 07/25/20 FVC 1.66 (55%) FEV1 1.37 (59%) Ratio 79  TLC 43% DLCO 110% Interpretation: Uninterpretable due to active exacerbation and cough. Data suggests moderately severe restrictive lung defect. Normal gas exchange.  Labs: Absolute eos 10/20/2019 -200 Absolute eos 12/18/2019 - 100     Assessment & Plan:   Discussion: 60 year old female active smoker with remote history of sarcoid, DM2 and HTN who presents in active COPD exacerbation secondary URI  Reviewed PFTs which is uninterpretable due to active  exacerbation and cough. Data suggests moderately severe restrictive lung defect. Normal gas exchange.  Does not tolerate prednisone due to agitation many years ago however after discussion she is willing to try low dose prednisone  Emphysema, mild COPD exacerbation --START Breo 200-25 mcg ONE puff daily. This is your EVERYDAY inhaler --START prednisone 20 mg in the morning for five days --START cough syrup at night. DO NOT TAKE with tramadol.   Hx of sarcoid  No formal tissue diagnosis --No indication for steroids --Pulmonary function test as above  Tobacco Use --Refer for lung cancer screening clinic with NP Groce --Discussed smoking cessation <3 minutes  Health Maintenance Immunization History  Administered Date(s) Administered   Influenza Split 11/01/2011, 12/18/2019   PFIZER(Purple Top)SARS-COV-2 Vaccination 02/13/2019, 05/05/2019, 02/02/2020   CT Lung Screen - refer to lung screening  No orders of the defined types were placed in this encounter.  Meds ordered this encounter  Medications   predniSONE (DELTASONE) 10 MG tablet    Sig: Take 2 tablets (20 mg total) by mouth daily with breakfast for 5 days.    Dispense:  20 tablet    Refill:  0   HYDROcodone bit-homatropine (HYCODAN) 5-1.5 MG/5ML syrup    Sig: Take 5 mLs by mouth every 6 (six) hours as needed for cough.    Dispense:  240 mL    Refill:  0   fluticasone furoate-vilanterol (BREO ELLIPTA) 200-25 MCG/INH AEPB    Sig: Inhale 1 puff into the lungs daily.    Dispense:  60 each    Refill:  0    Return in about 3 months (around 10/25/2020).  I have spent a total time of 36-minutes on the day of the appointment reviewing prior documentation, coordinating care and discussing medical diagnosis and plan with the patient/family. Past medical history, allergies, medications were reviewed. Pertinent imaging, labs and tests included in this note have been reviewed and interpreted independently by me.   Kelee Cunningham 10/27/2020, MD [facility] 07/25/2020 2:18 PM  Office Number [phone]

## 2020-07-26 NOTE — Telephone Encounter (Signed)
Called and spoke with Latoya Clark at [facility]. She is aware to go ahead and fill the prednisone RX since JE and the patient discussed the prednisone yesterday.   Nothing further needed at time of call.

## 2020-07-28 ENCOUNTER — Encounter: Payer: Self-pay | Admitting: Physician Assistant

## 2020-07-28 ENCOUNTER — Ambulatory Visit (INDEPENDENT_AMBULATORY_CARE_PROVIDER_SITE_OTHER): Payer: 59 | Admitting: Physician Assistant

## 2020-07-28 ENCOUNTER — Other Ambulatory Visit: Payer: Self-pay

## 2020-07-28 DIAGNOSIS — G4700 Insomnia, unspecified: Secondary | ICD-10-CM

## 2020-07-28 DIAGNOSIS — F411 Generalized anxiety disorder: Secondary | ICD-10-CM

## 2020-07-28 DIAGNOSIS — F3160 Bipolar disorder, current episode mixed, unspecified: Secondary | ICD-10-CM

## 2020-07-28 DIAGNOSIS — Z79899 Other long term (current) drug therapy: Secondary | ICD-10-CM | POA: Diagnosis not present

## 2020-07-28 MED ORDER — TRAZODONE HCL 100 MG PO TABS: ORAL_TABLET | ORAL | 2 refills | Status: DC

## 2020-07-28 MED ORDER — CLONAZEPAM 1 MG PO TABS: 1.0000 mg | ORAL_TABLET | Freq: Two times a day (BID) | ORAL | 2 refills | Status: DC | PRN

## 2020-07-28 MED ORDER — DIVALPROEX SODIUM ER 500 MG PO TB24: 1500.0000 mg | ORAL_TABLET | Freq: Every day | ORAL | 1 refills | Status: DC

## 2020-07-28 MED ORDER — ESCITALOPRAM OXALATE 20 MG PO TABS: 20.0000 mg | ORAL_TABLET | Freq: Every day | ORAL | 2 refills | Status: DC

## 2020-07-28 NOTE — Progress Notes (Signed)
[facility]  Patient ID: Latoya Clark,  MRN: [id number]  PCP: Latoya Housekeeper, MD  Date of Evaluation: 07/28/2020  time spent:30 minutes  Chief Complaint:  Chief Complaint   Anxiety; Depression; Insomnia; Follow-up      HISTORY/CURRENT STATUS: For routine med check.  "I'm not doing well. I have racing thoughts. Worry a lot."  Has been more impulsive, also spending more.  She sleeps only about 5-1/2 hours a night but that is "good for me."  It is better than it had been several months ago.  Denies increased energy with decreased need for sleep.  No increased libido.  No risky behaviors. Gets irritable easily, for the least little thing.  No paranoia or hallucinations.  Patient still has trouble enjoying things.  She chalks some of it up to worrying, but also not able to get around well b/c arthritis.  There is no change in energy or motivation.  Appetite has not changed.  No extreme sadness, tearfulness, but she does feel hopeless at times.  Denies any changes in concentration, making decisions or remembering things. Anxiety is well controlled with the Klonopin. Denies suicidal or homicidal thoughts.  Has been sick with a respiratory infection, states she got it from her grandkids.  Apparently not COVID.  She was given a cough med with an opiate and was told not to take the tramadol or trazodone while taking that.  Reports having more generalized pain since she is not able to take the tramadol.  That is discouraging.  She worries a lot because her children will not take her grandkids, their children, to the doctor when they have a runny nose or something.  She still has not had her Depakote level drawn.  States she was not aware that she was supposed to have it done. She again has difficulty telling me what medications that she is taking.  She is a very poor historian, I have asked her to bring her meds many times and she has a few times but not usual.   Reports not knowing  that she was supposed to do that. She has not been taking the Depakote and Lexapro as directed.   Denies dizziness, syncope, seizures, numbness, tingling, tremor, tics, unsteady gait, slurred speech, confusion.   Individual Medical History/ Review of Systems: Changes? :Yes   Dx with Emphysema since LOV  Past medications for mental health diagnoses include: VPA, Lexapro, Wellbutrin, Traz, Klonopin, Zoloft, Prozac, Ativan, Xanax, Lamictal caused a rash.  Allergies: Lamictal [lamotrigine], Lisinopril, Morphine and related, Penicillin g, Prednisone, and Codeine  Current Medications:  Current Outpatient Medications:    atorvastatin (LIPITOR) 80 MG tablet, Take 80 mg by mouth daily., Disp: , Rfl:    fluticasone furoate-vilanterol (BREO ELLIPTA) 200-25 MCG/INH AEPB, Inhale 1 puff into the lungs daily., Disp: 1 each, Rfl: 0   fluticasone furoate-vilanterol (BREO ELLIPTA) 200-25 MCG/INH AEPB, Inhale 1 puff into the lungs daily., Disp: 60 each, Rfl: 0   HYDROcodone bit-homatropine (HYCODAN) 5-1.5 MG/5ML syrup, Take 5 mLs by mouth every 6 (six) hours as needed for cough., Disp: 240 mL, Rfl: 0   losartan (COZAAR) 50 MG tablet, Take 50 mg by mouth daily., Disp: , Rfl: 11   metoprolol succinate (TOPROL-XL) 25 MG 24 hr tablet, TAKE ONE TABLET BY MOUTH DAILY, Disp: , Rfl:    Multiple Vitamin (MULTIVITAMIN) tablet, Take 1 tablet by mouth at bedtime., Disp: , Rfl:    SYNTHROID 137 MCG tablet, Take 137 mcg by mouth daily., Disp: ,  Rfl: 6   albuterol (VENTOLIN HFA) 108 (90 Base) MCG/ACT inhaler, , Disp: , Rfl:    cholecalciferol (VITAMIN D) 1000 units tablet, Take 1,000 Units by mouth daily. (Patient not taking: Reported on 07/28/2020), Disp: , Rfl:    clonazePAM (KLONOPIN) 1 MG tablet, Take 1 tablet (1 mg total) by mouth 2 (two) times daily as needed for anxiety., Disp: 60 tablet, Rfl: 2   divalproex (DEPAKOTE ER) 500 MG 24 hr tablet, Take 3 tablets (1,500 mg total) by mouth at bedtime. TAKE THREE TABLETS BY  MOUTH EVERY NIGHT AT BEDTIME, Disp: 90 tablet, Rfl: 1   escitalopram (LEXAPRO) 20 MG tablet, Take 1 tablet (20 mg total) by mouth daily., Disp: 30 tablet, Rfl: 2   famotidine (PEPCID) 20 MG tablet, Take 1 tablet (20 mg total) by mouth 2 (two) times daily. (Patient not taking: Reported on 07/28/2020), Disp: , Rfl:    metroNIDAZOLE (FLAGYL) 500 MG tablet, Take 1 tablet (500 mg total) by mouth 2 (two) times daily. (Patient not taking: No sig reported), Disp: 14 tablet, Rfl: 0   nitroGLYCERIN (NITROSTAT) 0.4 MG SL tablet, Place under the tongue., Disp: , Rfl:    Omega-3 Fatty Acids (FISH OIL PO), Take 1 capsule by mouth at bedtime., Disp: , Rfl:    traMADol (ULTRAM) 50 MG tablet, Take by mouth. (Patient not taking: Reported on 07/28/2020), Disp: , Rfl:    traZODone (DESYREL) 100 MG tablet, TAKE ONE TO TWO TABLETS BY MOUTH EVERY NIGHT AT BEDTIME AS NEEDED FOR SLEEP, Disp: 60 tablet, Rfl: 2 Medication Side Effects: none  Family Medical/ Social History: Changes? No  MENTAL HEALTH EXAM:  There were no vitals taken for this visit.There is no height or weight on file to calculate BMI.  General Appearance: Casual, Neat, Well Groomed and Obese  Eye Contact:  Good  Speech:  Normal Rate  Volume:  Normal  Mood:  Anxious and Depressed  Affect:  Depressed, Flat, Inappropriate, Anxious, and laughs inappropriately.  Thought Process:  Goal Directed and Descriptions of Associations: Intact  Orientation:  Full (Time, Place, and Person)  Thought Content: Logical   Suicidal Thoughts:  No  Homicidal Thoughts:  No  Memory:  WNL  Judgement:  Good  Insight:  Good  Psychomotor Activity:  Normal  Concentration:  Concentration: Good  Recall:  Good  Fund of Knowledge: Good  Language: Good  Assets:  Desire for Improvement  ADL's:  Intact  Cognition: WNL  Prognosis:  Good   Most recent pertinent labs: 12/18/2019 CMP LFTs nl CBC completely normal specifically platelets 290   DIAGNOSES:    ICD-10-CM   1.  Bipolar I disorder, most recent episode mixed (HCC)  F31.60 CBC with Differential/Platelet    Comprehensive metabolic panel    Valproic acid level    2. Generalized anxiety disorder  F41.1     3. Insomnia, unspecified type  G47.00     4. Encounter for long-term (current) use of medications  Z79.899 CBC with Differential/Platelet    Comprehensive metabolic panel    Valproic acid level       Receiving Psychotherapy: No     RECOMMENDATIONS:  PDMP was reviewed.  Last Klonopin 06/22/2020 I provided 30 minutes of face to face time during this encounter, including time spent before and after the visit in records review, medical decision making, and charting.  Due to the irritability I recommend increasing the Depakote.  And I stressed the importance of having labs drawn approximately 7 to 10 days after  increasing the Depakote. After further discussion, Lillah reports decreasing the Lexapro but is not able to give me a real answer as for the reason.  She is a poor historian making it difficult to treat.  At this point, I only want to make 1 change at a time so increase the Depakote and then go from there.  She does not want to come in frequently so will spread the visit out but she will call if she has any problems prior to that visit. Continue Klonopin 1 mg, 1 p.o. twice daily as needed. Increase Depakote ER 500 mg, to 3 p.o. nightly. Decrease Lexapro 20 mg, 1 qd. (She already did) Continue trazodone 100 mg, 1-2 nightly as needed sleep. Labs ordered as above.  Patient states she has an appointment in the next 2 weeks to have labs at her PCP's office.  She knows to give them the orders and ask them to draw the labs I have requested. Return in 3 months.  Melony Overly, PA-C

## 2020-07-31 ENCOUNTER — Other Ambulatory Visit: Payer: Self-pay | Admitting: Physician Assistant

## 2020-08-24 DIAGNOSIS — J441 Chronic obstructive pulmonary disease with (acute) exacerbation: Secondary | ICD-10-CM | POA: Insufficient documentation

## 2020-09-16 ENCOUNTER — Other Ambulatory Visit: Payer: Self-pay | Admitting: Nurse Practitioner

## 2020-09-16 ENCOUNTER — Telehealth: Payer: Self-pay | Admitting: Physician Assistant

## 2020-09-16 DIAGNOSIS — N644 Mastodynia: Secondary | ICD-10-CM

## 2020-09-16 NOTE — Telephone Encounter (Signed)
Please let Tinzley know that depakote level from 09/14/2020 is 84 which is good.  Have her stay on the same dose of Depakote. TSH was 1.9.  Hemoglobin A1c 6.1.

## 2020-09-16 NOTE — Telephone Encounter (Signed)
LVM with info

## 2020-10-05 ENCOUNTER — Other Ambulatory Visit: Payer: Self-pay | Admitting: Physician Assistant

## 2020-10-24 ENCOUNTER — Other Ambulatory Visit: Payer: 59

## 2020-10-27 ENCOUNTER — Other Ambulatory Visit: Payer: Self-pay

## 2020-10-27 ENCOUNTER — Ambulatory Visit (INDEPENDENT_AMBULATORY_CARE_PROVIDER_SITE_OTHER): Payer: 59 | Admitting: Physician Assistant

## 2020-10-27 ENCOUNTER — Encounter: Payer: Self-pay | Admitting: Physician Assistant

## 2020-10-27 DIAGNOSIS — G4700 Insomnia, unspecified: Secondary | ICD-10-CM

## 2020-10-27 DIAGNOSIS — F411 Generalized anxiety disorder: Secondary | ICD-10-CM

## 2020-10-27 DIAGNOSIS — F319 Bipolar disorder, unspecified: Secondary | ICD-10-CM | POA: Diagnosis not present

## 2020-10-27 MED ORDER — DIVALPROEX SODIUM ER 500 MG PO TB24: ORAL_TABLET | ORAL | 2 refills | Status: DC

## 2020-10-27 MED ORDER — TRAZODONE HCL 100 MG PO TABS: ORAL_TABLET | ORAL | 2 refills | Status: DC

## 2020-10-27 MED ORDER — ESCITALOPRAM OXALATE 20 MG PO TABS: 20.0000 mg | ORAL_TABLET | Freq: Every day | ORAL | 2 refills | Status: DC

## 2020-10-27 MED ORDER — CLONAZEPAM 1 MG PO TABS: 1.0000 mg | ORAL_TABLET | Freq: Two times a day (BID) | ORAL | 2 refills | Status: DC | PRN

## 2020-10-27 NOTE — Progress Notes (Signed)
[facility]  Patient ID: Latoya Clark,  MRN: [id number]  PCP: Georgann Housekeeper, MD  Date of Evaluation: 10/27/2020  time spent:30 minutes  Chief Complaint:  Chief Complaint   Anxiety; Depression; Insomnia; Follow-up      HISTORY/CURRENT STATUS: For routine med check.  Last seen 3 months ago. Depakote was increased at that time. Says she's not as irritable as she was.  Patient denies increased energy with decreased need for sleep, no increased talkativeness, no racing thoughts, no impulsivity or risky behaviors, no increased spending, no grandiosity, no paranoia, and no hallucinations.  Patient denies loss of interest in usual activities and is able to enjoy things.  Paints furniture for fun, and is happy doing that. Gets pieces at [employer]. Had her kitchen redone and happy about that. Denies decreased energy or motivation.  Appetite has not changed.  No extreme sadness, tearfulness, or feelings of hopelessness.  Denies any changes in concentration, making decisions or remembering things. Not isolating. Denies suicidal or homicidal thoughts.  Still gets anxious and aggravated.  Her husband has retired and is at home all the time now.  That is very frustrating to her.  Klonopin does help the anxiety though.  Denies dizziness, syncope, seizures, numbness, tingling, tremor, tics, unsteady gait, slurred speech, confusion.   Individual Medical History/ Review of Systems: Changes? :No     Past medications for mental health diagnoses include: VPA, Lexapro, Wellbutrin, Traz, Klonopin, Zoloft, Prozac, Ativan, Xanax, Lamictal caused a rash.  Allergies: Lamictal [lamotrigine], Lisinopril, Morphine and related, Penicillin g, Prednisone, and Codeine  Current Medications:  Current Outpatient Medications:    atorvastatin (LIPITOR) 80 MG tablet, Take 80 mg by mouth daily., Disp: , Rfl:    fluticasone furoate-vilanterol (BREO ELLIPTA) 200-25 MCG/INH AEPB, Inhale 1 puff into the  lungs daily., Disp: 1 each, Rfl: 0   fluticasone furoate-vilanterol (BREO ELLIPTA) 200-25 MCG/INH AEPB, Inhale 1 puff into the lungs daily., Disp: 60 each, Rfl: 0   losartan (COZAAR) 50 MG tablet, Take 50 mg by mouth daily., Disp: , Rfl: 11   metoprolol succinate (TOPROL-XL) 25 MG 24 hr tablet, TAKE ONE TABLET BY MOUTH DAILY, Disp: , Rfl:    Multiple Vitamin (MULTIVITAMIN) tablet, Take 1 tablet by mouth at bedtime., Disp: , Rfl:    nitroGLYCERIN (NITROSTAT) 0.4 MG SL tablet, Place under the tongue., Disp: , Rfl:    Omega-3 Fatty Acids (FISH OIL PO), Take 1 capsule by mouth at bedtime., Disp: , Rfl:    SYNTHROID 137 MCG tablet, Take 137 mcg by mouth daily., Disp: , Rfl: 6   traMADol (ULTRAM) 50 MG tablet, Take by mouth., Disp: , Rfl:    albuterol (VENTOLIN HFA) 108 (90 Base) MCG/ACT inhaler, , Disp: , Rfl:    cholecalciferol (VITAMIN D) 1000 units tablet, Take 1,000 Units by mouth daily. (Patient not taking: No sig reported), Disp: , Rfl:    clonazePAM (KLONOPIN) 1 MG tablet, Take 1 tablet (1 mg total) by mouth 2 (two) times daily as needed for anxiety., Disp: 60 tablet, Rfl: 2   divalproex (DEPAKOTE ER) 500 MG 24 hr tablet, TAKE THREE TABLETS BY MOUTH EVERY NIGHT AT BEDTIME, Disp: 90 tablet, Rfl: 2   escitalopram (LEXAPRO) 20 MG tablet, Take 1 tablet (20 mg total) by mouth daily., Disp: 30 tablet, Rfl: 2   famotidine (PEPCID) 20 MG tablet, Take 1 tablet (20 mg total) by mouth 2 (two) times daily. (Patient not taking: No sig reported), Disp: , Rfl:    furosemide (  LASIX) 20 MG tablet, Take 20 mg by mouth daily., Disp: , Rfl:    HYDROcodone bit-homatropine (HYCODAN) 5-1.5 MG/5ML syrup, Take 5 mLs by mouth every 6 (six) hours as needed for cough. (Patient not taking: Reported on 10/27/2020), Disp: 240 mL, Rfl: 0   metroNIDAZOLE (FLAGYL) 500 MG tablet, Take 1 tablet (500 mg total) by mouth 2 (two) times daily. (Patient not taking: No sig reported), Disp: 14 tablet, Rfl: 0   traZODone (DESYREL) 100 MG  tablet, TAKE ONE TO TWO TABLETS BY MOUTH EVERY NIGHT AT BEDTIME AS NEEDED FOR SLEEP, Disp: 60 tablet, Rfl: 2 Medication Side Effects: none  Family Medical/ Social History: Changes? No  MENTAL HEALTH EXAM:  There were no vitals taken for this visit.There is no height or weight on file to calculate BMI.  General Appearance: Casual, Neat, Well Groomed and Obese  Eye Contact:  Good  Speech:  Normal Rate  Volume:  Normal  Mood:  Euthymic  Affect:  Congruent  Thought Process:  Goal Directed and Descriptions of Associations: Intact  Orientation:  Full (Time, Place, and Person)  Thought Content: Logical   Suicidal Thoughts:  No  Homicidal Thoughts:  No  Memory:  WNL  Judgement:  Good  Insight:  Good  Psychomotor Activity:  Normal  Concentration:  Concentration: Good  Recall:  Good  Fund of Knowledge: Good  Language: Good  Assets:  Desire for Improvement  ADL's:  Intact  Cognition: WNL  Prognosis:  Good     DIAGNOSES:    ICD-10-CM   1. Bipolar I disorder (HCC)  F31.9     2. Generalized anxiety disorder  F41.1     3. Insomnia, unspecified type  G47.00         Receiving Psychotherapy: No     RECOMMENDATIONS:  PDMP was reviewed.  Last Klonopin fill 09/28/2020.  Tramadol known to me. I provided 30 minutes of face to face time during this encounter, including time spent before and after the visit in records review, medical decision making, and charting.  She seems to be doing much better, more active doing things around the house, not wanting to stay in bed all the time, and even though she is anxious at times and does not like for her husband to be home all the time she is handling it well.  Her medications seem to be working well. Continue Klonopin 1 mg, 1 p.o. twice daily as needed. Continue Depakote ER 500 mg, to 3 p.o. nightly. Cont Lexapro 20 mg, 1 qd.  Continue trazodone 100 mg, 1-2 nightly as needed sleep. Will get labs faxed to me from [facility].  Return in  3 months.  Melony Overly, PA-C

## 2021-01-27 ENCOUNTER — Ambulatory Visit: Payer: 59 | Admitting: Physician Assistant

## 2021-01-31 ENCOUNTER — Other Ambulatory Visit: Payer: 59

## 2021-02-02 ENCOUNTER — Other Ambulatory Visit: Payer: Self-pay

## 2021-02-02 ENCOUNTER — Telehealth: Payer: Self-pay | Admitting: Physician Assistant

## 2021-02-02 MED ORDER — CLONAZEPAM 1 MG PO TABS: 1.0000 mg | ORAL_TABLET | Freq: Two times a day (BID) | ORAL | 0 refills | Status: DC | PRN

## 2021-02-02 NOTE — Telephone Encounter (Signed)
Pended.

## 2021-02-02 NOTE — Telephone Encounter (Signed)
Pt LVM asking for refill of Klonopin to [facility].  She will be out of meds today.  Next appt 2/16

## 2021-02-16 ENCOUNTER — Other Ambulatory Visit: Payer: Self-pay | Admitting: Internal Medicine

## 2021-02-16 ENCOUNTER — Ambulatory Visit
Admission: RE | Admit: 2021-02-16 | Discharge: 2021-02-16 | Disposition: A | Discharge status: Home / Self Care | Payer: Managed Care, Other (non HMO) | Source: Ambulatory Visit | Attending: Internal Medicine | Admitting: Internal Medicine

## 2021-02-16 DIAGNOSIS — R0789 Other chest pain: Secondary | ICD-10-CM

## 2021-02-23 ENCOUNTER — Other Ambulatory Visit: Payer: Self-pay

## 2021-02-23 ENCOUNTER — Ambulatory Visit (INDEPENDENT_AMBULATORY_CARE_PROVIDER_SITE_OTHER): Payer: 59 | Admitting: Physician Assistant

## 2021-02-23 ENCOUNTER — Encounter: Payer: Self-pay | Admitting: Physician Assistant

## 2021-02-23 DIAGNOSIS — F331 Major depressive disorder, recurrent, moderate: Secondary | ICD-10-CM | POA: Diagnosis not present

## 2021-02-23 DIAGNOSIS — G4700 Insomnia, unspecified: Secondary | ICD-10-CM | POA: Diagnosis not present

## 2021-02-23 DIAGNOSIS — F411 Generalized anxiety disorder: Secondary | ICD-10-CM | POA: Diagnosis not present

## 2021-02-23 DIAGNOSIS — F319 Bipolar disorder, unspecified: Secondary | ICD-10-CM | POA: Diagnosis not present

## 2021-02-23 MED ORDER — CLONAZEPAM 1 MG PO TABS: 1.0000 mg | ORAL_TABLET | Freq: Two times a day (BID) | ORAL | 5 refills | Status: DC | PRN

## 2021-02-23 MED ORDER — DIVALPROEX SODIUM ER 500 MG PO TB24: ORAL_TABLET | ORAL | 5 refills | Status: DC

## 2021-02-23 MED ORDER — TRAZODONE HCL 100 MG PO TABS: ORAL_TABLET | ORAL | 5 refills | Status: DC

## 2021-02-23 MED ORDER — ESCITALOPRAM OXALATE 20 MG PO TABS: 30.0000 mg | ORAL_TABLET | Freq: Every day | ORAL | 1 refills | Status: DC

## 2021-02-23 NOTE — Progress Notes (Signed)
[facility]  Patient ID: Latoya Clark,  MRN: [id number]  PCP: Georgann Housekeeper, MD  Date of Evaluation: 02/23/2021  time spent:30 minutes  Chief Complaint:  Chief Complaint   Anxiety; Depression; Insomnia; Follow-up     HISTORY/CURRENT STATUS: For routine med check.  Very stressed. Her husband is home all the time now and they don't get along. Not happy. She doesn't get to see her grandchildren. Her dtr told her the more she stays away from her, the happier she (the dtr) is. So she can't see the grandkids. Carleigh also is worried about the way the world is.   Not enjoying anything. Finances are an issue, doesn't have money to do anything, just making ends meet.   When she starts thinking about all that, she has some 'really dark thoughts.' Like 'not wanting to be here.' Denies plan for suicide.   Patient denies increased energy with decreased need for sleep, no increased talkativeness, no racing thoughts, no impulsivity or risky behaviors, no increased spending, no increased libido, no grandiosity, no paranoia,  and no hallucinations.  Denies dizziness, syncope, seizures, numbness, tingling, tremor, tics, unsteady gait, slurred speech, confusion. Chronic muscle aches and joint pain.  Individual Medical History/ Review of Systems: Changes? :No     Past medications for mental health diagnoses include: VPA, Lexapro, Wellbutrin, Traz, Klonopin, Zoloft, Prozac, Ativan, Xanax, Lamictal caused a rash.  Allergies: Lamictal [lamotrigine], Lisinopril, Morphine and related, Penicillin g, Prednisone, and Codeine  Current Medications:  Current Outpatient Medications:    atorvastatin (LIPITOR) 80 MG tablet, Take 80 mg by mouth daily., Disp: , Rfl:    fluticasone furoate-vilanterol (BREO ELLIPTA) 200-25 MCG/INH AEPB, Inhale 1 puff into the lungs daily., Disp: 1 each, Rfl: 0   fluticasone furoate-vilanterol (BREO ELLIPTA) 200-25 MCG/INH AEPB, Inhale 1 puff into the lungs daily.,  Disp: 60 each, Rfl: 0   losartan (COZAAR) 50 MG tablet, Take 50 mg by mouth daily., Disp: , Rfl: 11   metoprolol succinate (TOPROL-XL) 25 MG 24 hr tablet, TAKE ONE TABLET BY MOUTH DAILY, Disp: , Rfl:    Omega-3 Fatty Acids (FISH OIL PO), Take 1 capsule by mouth at bedtime., Disp: , Rfl:    SYNTHROID 137 MCG tablet, Take 137 mcg by mouth daily., Disp: , Rfl: 6   traMADol (ULTRAM) 50 MG tablet, Take by mouth., Disp: , Rfl:    albuterol (VENTOLIN HFA) 108 (90 Base) MCG/ACT inhaler, , Disp: , Rfl:    cholecalciferol (VITAMIN D) 1000 units tablet, Take 1,000 Units by mouth daily. (Patient not taking: Reported on 07/28/2020), Disp: , Rfl:    clonazePAM (KLONOPIN) 1 MG tablet, Take 1 tablet (1 mg total) by mouth 2 (two) times daily as needed for anxiety., Disp: 60 tablet, Rfl: 5   divalproex (DEPAKOTE ER) 500 MG 24 hr tablet, TAKE THREE TABLETS BY MOUTH EVERY NIGHT AT BEDTIME, Disp: 90 tablet, Rfl: 5   escitalopram (LEXAPRO) 20 MG tablet, Take 1.5 tablets (30 mg total) by mouth daily., Disp: 45 tablet, Rfl: 1   famotidine (PEPCID) 20 MG tablet, Take 1 tablet (20 mg total) by mouth 2 (two) times daily. (Patient not taking: Reported on 07/28/2020), Disp: , Rfl:    furosemide (LASIX) 20 MG tablet, TAKE ONE TABLET BY MOUTH DAILY, Disp: 90 tablet, Rfl: 0   HYDROcodone bit-homatropine (HYCODAN) 5-1.5 MG/5ML syrup, Take 5 mLs by mouth every 6 (six) hours as needed for cough. (Patient not taking: Reported on 10/27/2020), Disp: 240 mL, Rfl: 0   metroNIDAZOLE (  FLAGYL) 500 MG tablet, Take 1 tablet (500 mg total) by mouth 2 (two) times daily. (Patient not taking: Reported on 07/25/2020), Disp: 14 tablet, Rfl: 0   Multiple Vitamin (MULTIVITAMIN) tablet, Take 1 tablet by mouth at bedtime. (Patient not taking: Reported on 02/23/2021), Disp: , Rfl:    nitroGLYCERIN (NITROSTAT) 0.4 MG SL tablet, Place under the tongue. (Patient not taking: Reported on 02/23/2021), Disp: , Rfl:    traZODone (DESYREL) 100 MG tablet, TAKE ONE TO  TWO TABLETS BY MOUTH EVERY NIGHT AT BEDTIME AS NEEDED FOR SLEEP, Disp: 60 tablet, Rfl: 5 Medication Side Effects: none  Family Medical/ Social History: Changes? No  MENTAL HEALTH EXAM:  There were no vitals taken for this visit.There is no height or weight on file to calculate BMI.  General Appearance: Casual, Neat, Well Groomed and Obese  Eye Contact:  Good  Speech:  Normal Rate  Volume:  Normal  Mood:  Depressed  Affect:  Depressed  Thought Process:  Goal Directed and Descriptions of Associations: Intact  Orientation:  Full (Time, Place, and Person)  Thought Content: Logical   Suicidal Thoughts:  No  Homicidal Thoughts:  No  Memory:  WNL  Judgement:  Good  Insight:  Good  Psychomotor Activity:  Normal  Concentration:  Concentration: Good  Recall:  Good  Fund of Knowledge: Good  Language: Good  Assets:  Desire for Improvement  ADL's:  Intact  Cognition: WNL  Prognosis:  Good   09/14/20 Depakote level 84  02/16/21 Plt 207 LFTs nl Na nl All labs found under Care Everywhere [facility]  DIAGNOSES:    ICD-10-CM   1. Major depressive disorder, recurrent episode, moderate (HCC)  F33.1     2. Generalized anxiety disorder  F41.1     3. Insomnia, unspecified type  G47.00     4. Bipolar I disorder (HCC)  F31.9        Receiving Psychotherapy: No     RECOMMENDATIONS:  PDMP was reviewed.  Last Klonopin fill 02/02/2021.  Tramadol known to me. I provided 30 minutes of face to face time during this encounter, including time spent before and after the visit in records review, medical decision making, counseling pertinent to today's visit, and charting.  Disc tx options. A lot of her symptoms are circumstantial, but increasing Lexapro may be helpful. She understands it will be dosed higher than max FDA recommendations, but sometimes we go up to twice that dose, Will watch closely for any SE. She would like to try it.   Continue Klonopin 1 mg, 1 p.o. twice daily as  needed. Continue Depakote ER 500 mg, to 3 p.o. nightly.  Increase Lexapro 20 mg, to 1.5 pills qd.  Continue trazodone 100 mg, 1-2 nightly as needed sleep. Strongly recommend therapy. Return in 6 wks.  Melony Overly, PA-C

## 2021-03-01 ENCOUNTER — Other Ambulatory Visit: Payer: Self-pay | Admitting: Cardiovascular Disease

## 2021-04-06 ENCOUNTER — Ambulatory Visit (INDEPENDENT_AMBULATORY_CARE_PROVIDER_SITE_OTHER): Payer: Self-pay | Admitting: Physician Assistant

## 2021-04-06 DIAGNOSIS — Z91199 Patient's noncompliance with other medical treatment and regimen due to unspecified reason: Secondary | ICD-10-CM

## 2021-04-06 NOTE — Progress Notes (Signed)
Did not show up for appointment

## 2021-04-24 ENCOUNTER — Other Ambulatory Visit: Payer: Self-pay | Admitting: Physician Assistant

## 2021-05-05 ENCOUNTER — Encounter: Payer: Self-pay | Admitting: Physician Assistant

## 2021-05-05 ENCOUNTER — Ambulatory Visit (INDEPENDENT_AMBULATORY_CARE_PROVIDER_SITE_OTHER): Payer: 59 | Admitting: Physician Assistant

## 2021-05-05 DIAGNOSIS — F411 Generalized anxiety disorder: Secondary | ICD-10-CM | POA: Diagnosis not present

## 2021-05-05 DIAGNOSIS — G4700 Insomnia, unspecified: Secondary | ICD-10-CM | POA: Diagnosis not present

## 2021-05-05 DIAGNOSIS — F4323 Adjustment disorder with mixed anxiety and depressed mood: Secondary | ICD-10-CM

## 2021-05-05 MED ORDER — ESCITALOPRAM OXALATE 20 MG PO TABS: 20.0000 mg | ORAL_TABLET | Freq: Every day | ORAL | 1 refills | Status: DC

## 2021-05-05 NOTE — Progress Notes (Signed)
[facility] ? ?Patient ID: Latoya Clark,  ?MRN: [id number] ? ?PCP: Georgann Housekeeper, MD ? ?Date of Evaluation: 05/05/2021  ?time spent:20 minutes ? ?Chief Complaint:  ?Chief Complaint   ?Depression; Anxiety; Insomnia; Follow-up ?  ? ? ?HISTORY/CURRENT STATUS: ?For routine med check. ? ?Feels despair, lonely, bored. "I'm going to start volunteering at the animal shelter once a week. That'll help. It will be good for me to get out of the house and give some animals some love." Her husband is retired and stays home all the time, and she wishes he'd leave and go somewhere for a while. ? ?We increased Lexapro at the last visit.  She states that has not made any difference at all.  She does not want to go anywhere or do anything.  Plus if she does go somewhere she will shop and she should not spend money right now.  They are still having financial trouble appetite is normal and weight is stable.  ADLs and personal hygiene are normal.  She does not cry easily.  Sleeps okay with the trazodone.  No suicidal or homicidal thoughts. ? ?She does get anxious a lot because of her husband, their financial troubles, worrying about her kids, and the fact that she does not have relationships with them like she would want to.  She was not even invited to her grandchild's birthday party.  The Klonopin does help the way she feels.  Not having panic attacks but just an overwhelming sense of unease a lot of the time. ? ?Patient denies increased energy with decreased need for sleep, no increased talkativeness, no racing thoughts, no impulsivity or risky behaviors, no increased libido, no grandiosity, no paranoia,  and no hallucinations. ? ?Denies dizziness, syncope, seizures, numbness, tingling, tremor, tics, unsteady gait, slurred speech, confusion. Chronic muscle aches and joint pain.  States she has sciatica of right now and is in a lot of pain from that. ? ?Individual Medical History/ Review of Systems: Changes? :No     ? ?Past medications for mental health diagnoses include: ?VPA, Lexapro, Wellbutrin, Traz, Klonopin, Zoloft, Prozac, Ativan, Xanax, Lamictal caused a rash. ? ?Allergies: Lamictal [lamotrigine], Lisinopril, Morphine and related, Penicillin g, Prednisone, and Codeine ? ?Current Medications:  ?Current Outpatient Medications:  ?  atorvastatin (LIPITOR) 80 MG tablet, Take 80 mg by mouth daily., Disp: , Rfl:  ?  clonazePAM (KLONOPIN) 1 MG tablet, Take 1 tablet (1 mg total) by mouth 2 (two) times daily as needed for anxiety., Disp: 60 tablet, Rfl: 5 ?  divalproex (DEPAKOTE ER) 500 MG 24 hr tablet, TAKE THREE TABLETS BY MOUTH EVERY NIGHT AT BEDTIME, Disp: 90 tablet, Rfl: 5 ?  fluticasone furoate-vilanterol (BREO ELLIPTA) 200-25 MCG/INH AEPB, Inhale 1 puff into the lungs daily., Disp: 1 each, Rfl: 0 ?  fluticasone furoate-vilanterol (BREO ELLIPTA) 200-25 MCG/INH AEPB, Inhale 1 puff into the lungs daily., Disp: 60 each, Rfl: 0 ?  furosemide (LASIX) 20 MG tablet, TAKE ONE TABLET BY MOUTH DAILY, Disp: 90 tablet, Rfl: 0 ?  losartan (COZAAR) 50 MG tablet, Take 50 mg by mouth daily., Disp: , Rfl: 11 ?  metoprolol succinate (TOPROL-XL) 25 MG 24 hr tablet, TAKE ONE TABLET BY MOUTH DAILY, Disp: , Rfl:  ?  SYNTHROID 137 MCG tablet, Take 137 mcg by mouth daily., Disp: , Rfl: 6 ?  traMADol (ULTRAM) 50 MG tablet, Take by mouth., Disp: , Rfl:  ?  traZODone (DESYREL) 100 MG tablet, TAKE ONE TO TWO TABLETS BY MOUTH EVERY NIGHT  AT BEDTIME AS NEEDED FOR SLEEP, Disp: 60 tablet, Rfl: 5 ?  albuterol (VENTOLIN HFA) 108 (90 Base) MCG/ACT inhaler, , Disp: , Rfl:  ?  cholecalciferol (VITAMIN D) 1000 units tablet, Take 1,000 Units by mouth daily. (Patient not taking: Reported on 07/28/2020), Disp: , Rfl:  ?  escitalopram (LEXAPRO) 20 MG tablet, Take 1 tablet (20 mg total) by mouth daily., Disp: 30 tablet, Rfl: 1 ?  famotidine (PEPCID) 20 MG tablet, Take 1 tablet (20 mg total) by mouth 2 (two) times daily. (Patient not taking: Reported on 07/28/2020),  Disp: , Rfl:  ?  HYDROcodone bit-homatropine (HYCODAN) 5-1.5 MG/5ML syrup, Take 5 mLs by mouth every 6 (six) hours as needed for cough. (Patient not taking: Reported on 10/27/2020), Disp: 240 mL, Rfl: 0 ?  metroNIDAZOLE (FLAGYL) 500 MG tablet, Take 1 tablet (500 mg total) by mouth 2 (two) times daily. (Patient not taking: Reported on 07/25/2020), Disp: 14 tablet, Rfl: 0 ?  Multiple Vitamin (MULTIVITAMIN) tablet, Take 1 tablet by mouth at bedtime. (Patient not taking: Reported on 02/23/2021), Disp: , Rfl:  ?  nitroGLYCERIN (NITROSTAT) 0.4 MG SL tablet, Place under the tongue. (Patient not taking: Reported on 02/23/2021), Disp: , Rfl:  ?  Omega-3 Fatty Acids (FISH OIL PO), Take 1 capsule by mouth at bedtime. (Patient not taking: Reported on 05/05/2021), Disp: , Rfl:  ?Medication Side Effects: none ? ?Family Medical/ Social History: Changes? No ? ?MENTAL HEALTH EXAM: ? ?There were no vitals taken for this visit.There is no height or weight on file to calculate BMI.  ?General Appearance: Casual, Neat, Well Groomed and Obese  ?Eye Contact:  Good  ?Speech:  Normal Rate  ?Volume:  Normal  ?Mood:  Depressed  ?Affect:  Depressed  ?Thought Process:  Goal Directed and Descriptions of Associations: Intact  ?Orientation:  Full (Time, Place, and Person)  ?Thought Content: Logical   ?Suicidal Thoughts:  No  ?Homicidal Thoughts:  No  ?Memory:  WNL  ?Judgement:  Good  ?Insight:  Good  ?Psychomotor Activity:   Walks slowly with a cane  ?Concentration:  Concentration: Good  ?Recall:  Good  ?Fund of Knowledge: Good  ?Language: Good  ?Assets:  Desire for Improvement  ?ADL's:  Intact  ?Cognition: WNL  ?Prognosis:  Good  ? ?09/14/20 ?Depakote level 84 ? ?02/16/21 ?Plt 207 ?LFTs nl ?Na nl ?All labs found under Care Everywhere [facility] ? ?DIAGNOSES:  ?  ICD-10-CM   ?1. Situational mixed anxiety and depressive disorder  F43.23   ?  ?2. Generalized anxiety disorder  F41.1   ?  ?3. Insomnia, unspecified type  G47.00   ?  ? ? ?Receiving  Psychotherapy: No   ? ? ?RECOMMENDATIONS:  ?PDMP was reviewed.  Last Klonopin filled 04/24/2021.  Tramadol known to me. ?I provided 20 minutes of face to face time during this encounter, including time spent before and after the visit in records review, medical decision making, counseling pertinent to today's visit, and charting.  ?Since the increase Lexapro has not really made a difference in the way she feels I recommend going back to the 20 mg. ?I explained to her what she is feeling will not be fixed by a pill.  She needs to be in counseling.  I gave her several names of therapist in town, she will look them up on line and contact them. ?See her PCP about her back pain. ? ?Continue Klonopin 1 mg, 1 p.o. twice daily as needed. ?Continue Depakote ER 500 mg, to 3  p.o. nightly.  ?Decrease Lexapro back to 20 mg, 1 p.o. daily. ?Continue trazodone 100 mg, 1-2 nightly as needed sleep. ?Strongly recommend therapy. ?Return in 3 months. ? ?Melony Overly, PA-C  ?

## 2021-06-02 ENCOUNTER — Other Ambulatory Visit: Payer: Self-pay | Admitting: Cardiovascular Disease

## 2021-06-13 ENCOUNTER — Ambulatory Visit
Admission: RE | Admit: 2021-06-13 | Discharge: 2021-06-13 | Disposition: A | Discharge status: Home / Self Care | Payer: Commercial Managed Care - HMO | Source: Ambulatory Visit | Attending: Internal Medicine | Admitting: Internal Medicine

## 2021-06-13 DIAGNOSIS — F17210 Nicotine dependence, cigarettes, uncomplicated: Secondary | ICD-10-CM

## 2021-06-15 ENCOUNTER — Other Ambulatory Visit: Payer: Self-pay

## 2021-06-15 DIAGNOSIS — Z122 Encounter for screening for malignant neoplasm of respiratory organs: Secondary | ICD-10-CM

## 2021-06-15 DIAGNOSIS — Z87891 Personal history of nicotine dependence: Secondary | ICD-10-CM

## 2021-06-15 DIAGNOSIS — F17210 Nicotine dependence, cigarettes, uncomplicated: Secondary | ICD-10-CM

## 2021-06-27 ENCOUNTER — Ambulatory Visit
Admission: RE | Admit: 2021-06-27 | Discharge: 2021-06-27 | Disposition: A | Discharge status: Home / Self Care | Payer: Commercial Managed Care - HMO | Source: Ambulatory Visit | Attending: Internal Medicine | Admitting: Internal Medicine

## 2021-06-27 ENCOUNTER — Other Ambulatory Visit: Payer: Self-pay | Admitting: Psychiatry

## 2021-06-27 ENCOUNTER — Other Ambulatory Visit: Payer: Self-pay | Admitting: Internal Medicine

## 2021-06-27 DIAGNOSIS — M533 Sacrococcygeal disorders, not elsewhere classified: Secondary | ICD-10-CM

## 2021-08-01 ENCOUNTER — Other Ambulatory Visit: Payer: Self-pay | Admitting: Physician Assistant

## 2021-08-04 ENCOUNTER — Ambulatory Visit (INDEPENDENT_AMBULATORY_CARE_PROVIDER_SITE_OTHER): Payer: 59 | Admitting: Physician Assistant

## 2021-08-04 DIAGNOSIS — Z91199 Patient's noncompliance with other medical treatment and regimen due to unspecified reason: Secondary | ICD-10-CM

## 2021-08-04 NOTE — Progress Notes (Signed)
No show for appt. 

## 2021-08-21 ENCOUNTER — Other Ambulatory Visit: Payer: Self-pay | Admitting: Physician Assistant

## 2021-08-21 NOTE — Telephone Encounter (Signed)
Please schedule appt

## 2021-08-22 NOTE — Telephone Encounter (Signed)
Pt is scheduled 7/28 

## 2021-08-28 ENCOUNTER — Other Ambulatory Visit: Payer: Self-pay | Admitting: Physician Assistant

## 2021-08-30 ENCOUNTER — Encounter: Payer: Self-pay | Admitting: Physician Assistant

## 2021-08-30 ENCOUNTER — Ambulatory Visit (INDEPENDENT_AMBULATORY_CARE_PROVIDER_SITE_OTHER): Payer: Commercial Managed Care - HMO | Admitting: Physician Assistant

## 2021-08-30 DIAGNOSIS — F411 Generalized anxiety disorder: Secondary | ICD-10-CM | POA: Diagnosis not present

## 2021-08-30 DIAGNOSIS — F319 Bipolar disorder, unspecified: Secondary | ICD-10-CM | POA: Diagnosis not present

## 2021-08-30 DIAGNOSIS — F3341 Major depressive disorder, recurrent, in partial remission: Secondary | ICD-10-CM | POA: Diagnosis not present

## 2021-08-30 MED ORDER — ESCITALOPRAM OXALATE 20 MG PO TABS: 30.0000 mg | ORAL_TABLET | Freq: Every day | ORAL | 5 refills | Status: DC

## 2021-08-30 MED ORDER — ESCITALOPRAM OXALATE 20 MG PO TABS: 20.0000 mg | ORAL_TABLET | Freq: Every day | ORAL | 5 refills | Status: DC

## 2021-08-30 MED ORDER — CLONAZEPAM 1 MG PO TABS
1.0000 mg | ORAL_TABLET | Freq: Two times a day (BID) | ORAL | 5 refills | Status: DC | PRN
Start: 2021-08-30 — End: 2022-02-28

## 2021-08-30 NOTE — Progress Notes (Signed)
[facility]  Patient ID: Latoya Clark,  MRN: [id number]  PCP: Georgann Housekeeper, MD  Date of Evaluation: 08/30/2021  time spent:20 minutes  Chief Complaint:  Chief Complaint   Anxiety; Depression; Insomnia; Follow-up    HISTORY/CURRENT STATUS: For routine med check.  Doing well as far as her mental health goes. Still noy able to her grandkids like she wants but "It's something I just have to live with."  Doesn't enjoy much, partly d/t finances, husband not working, and she doesn't have anybody to do things with.  Energy and motivation are good.  No extreme sadness, tearfulness, or feelings of hopelessness.  Sleeps well most of the time. ADLs and personal hygiene are normal.   Denies any changes in concentration, making decisions, or remembering things.  Appetite has not changed.  Weight is stable. Still has anxiety, not PA but more a sense of foreboding. Klonopin helps. Denies suicidal or homicidal thoughts.  Patient denies increased energy with decreased need for sleep, increased talkativeness, racing thoughts, impulsivity or risky behaviors, increased spending, increased libido, grandiosity, increased irritability or anger, paranoia, and no hallucinations.  Denies dizziness, syncope, seizures, numbness, tingling, tremor, tics, unsteady gait, slurred speech, confusion. Chronic muscle aches and joint pain.   Individual Medical History/ Review of Systems: Changes? :No     Past medications for mental health diagnoses include: VPA, Lexapro, Wellbutrin, Traz, Klonopin, Zoloft, Prozac, Ativan, Xanax, Lamictal caused a rash.  Allergies: Lamictal [lamotrigine], Lisinopril, Morphine and related, Penicillin g, Prednisone, and Codeine  Current Medications:  Current Outpatient Medications:    atorvastatin (LIPITOR) 80 MG tablet, Take 80 mg by mouth daily., Disp: , Rfl:    divalproex (DEPAKOTE ER) 500 MG 24 hr tablet, TAKE THREE TABLETS BY MOUTH DAILY AT BEDTIME, Disp: 90 tablet,  Rfl: 5   fluticasone furoate-vilanterol (BREO ELLIPTA) 200-25 MCG/INH AEPB, Inhale 1 puff into the lungs daily., Disp: 1 each, Rfl: 0   fluticasone furoate-vilanterol (BREO ELLIPTA) 200-25 MCG/INH AEPB, Inhale 1 puff into the lungs daily., Disp: 60 each, Rfl: 0   furosemide (LASIX) 20 MG tablet, TAKE ONE TABLET BY MOUTH DAILY, Disp: 90 tablet, Rfl: 0   gabapentin (NEURONTIN) 100 MG capsule, Take 100 mg by mouth daily., Disp: , Rfl:    losartan (COZAAR) 50 MG tablet, Take 50 mg by mouth daily., Disp: , Rfl: 11   metoprolol succinate (TOPROL-XL) 25 MG 24 hr tablet, TAKE ONE TABLET BY MOUTH DAILY, Disp: , Rfl:    SYNTHROID 137 MCG tablet, Take 137 mcg by mouth daily., Disp: , Rfl: 6   traMADol (ULTRAM) 50 MG tablet, Take by mouth., Disp: , Rfl:    traZODone (DESYREL) 100 MG tablet, TAKE ONE TO TWO TABLETS BY MOUTH EVERY NIGHT AT BEDTIME AS NEEDED FOR SLEEP, Disp: 60 tablet, Rfl: 5   albuterol (VENTOLIN HFA) 108 (90 Base) MCG/ACT inhaler, , Disp: , Rfl:    cholecalciferol (VITAMIN D) 1000 units tablet, Take 1,000 Units by mouth daily. (Patient not taking: Reported on 07/28/2020), Disp: , Rfl:    clonazePAM (KLONOPIN) 1 MG tablet, Take 1 tablet (1 mg total) by mouth 2 (two) times daily as needed for anxiety., Disp: 60 tablet, Rfl: 5   escitalopram (LEXAPRO) 20 MG tablet, Take 1 tablet (20 mg total) by mouth daily., Disp: 30 tablet, Rfl: 5   famotidine (PEPCID) 20 MG tablet, Take 1 tablet (20 mg total) by mouth 2 (two) times daily. (Patient not taking: Reported on 07/28/2020), Disp: , Rfl:    HYDROcodone bit-homatropine (  HYCODAN) 5-1.5 MG/5ML syrup, Take 5 mLs by mouth every 6 (six) hours as needed for cough. (Patient not taking: Reported on 10/27/2020), Disp: 240 mL, Rfl: 0   metroNIDAZOLE (FLAGYL) 500 MG tablet, Take 1 tablet (500 mg total) by mouth 2 (two) times daily. (Patient not taking: Reported on 07/25/2020), Disp: 14 tablet, Rfl: 0   Multiple Vitamin (MULTIVITAMIN) tablet, Take 1 tablet by mouth at  bedtime. (Patient not taking: Reported on 02/23/2021), Disp: , Rfl:    nitroGLYCERIN (NITROSTAT) 0.4 MG SL tablet, Place under the tongue. (Patient not taking: Reported on 02/23/2021), Disp: , Rfl:    Omega-3 Fatty Acids (FISH OIL PO), Take 1 capsule by mouth at bedtime. (Patient not taking: Reported on 05/05/2021), Disp: , Rfl:  Medication Side Effects: none  Family Medical/ Social History: Changes? No  MENTAL HEALTH EXAM:  There were no vitals taken for this visit.There is no height or weight on file to calculate BMI.  General Appearance: Casual, Well Groomed, and Obese  Eye Contact:  Good  Speech:  Normal Rate  Volume:  Normal  Mood:  Euthymic  Affect:  Congruent  Thought Process:  Goal Directed and Descriptions of Associations: Intact  Orientation:  Full (Time, Place, and Person)  Thought Content: Logical   Suicidal Thoughts:  No  Homicidal Thoughts:  No  Memory:  WNL  Judgement:  Good  Insight:  Good  Psychomotor Activity:  Normal  Concentration:  Concentration: Good  Recall:  Good  Fund of Knowledge: Good  Language: Good  Assets:  Desire for Improvement  ADL's:  Intact  Cognition: WNL  Prognosis:  Good   02/16/21 Plt 207 LFTs nl Na nl All labs found under Care Everywhere [facility]  DIAGNOSES:    ICD-10-CM   1. Bipolar I disorder (HCC)  F31.9     2. Generalized anxiety disorder  F41.1     3. Recurrent major depressive disorder, in partial remission (HCC)  F33.41      Receiving Psychotherapy: No     RECOMMENDATIONS:  PDMP was reviewed.  Last Klonopin filled 08/10/2021.  Tramadol known to me. I provided 20 minutes of face to face time during this encounter, including time spent before and after the visit in records review, medical decision making, counseling pertinent to today's visit, and charting.   She is doing well from my standpoint so no med changes are needed.  Continue Klonopin 1 mg, 1 p.o. twice daily as needed. Continue Depakote ER 500 mg, to 3 p.o.  nightly.  Continue Lexapro 20 mg, 1 p.o. daily. Continue Gabapentin 100 mg, 1 qd (another provider.) Continue trazodone 100 mg, 1-2 nightly as needed sleep. Strongly recommend therapy. She will see PCP next month for annual H&P and will get labs drawn then. He does Depakote level, along with all other routine labs.  Return in 6 months.  Melony Overly, PA-C

## 2021-09-25 ENCOUNTER — Other Ambulatory Visit: Payer: Self-pay | Admitting: Physician Assistant

## 2021-09-28 ENCOUNTER — Other Ambulatory Visit: Payer: Self-pay | Admitting: Internal Medicine

## 2021-09-28 ENCOUNTER — Ambulatory Visit
Admission: RE | Admit: 2021-09-28 | Discharge: 2021-09-28 | Disposition: A | Discharge status: Home / Self Care | Payer: Commercial Managed Care - HMO | Source: Ambulatory Visit | Attending: Internal Medicine | Admitting: Internal Medicine

## 2021-09-28 DIAGNOSIS — M5136 Other intervertebral disc degeneration, lumbar region: Secondary | ICD-10-CM

## 2021-09-28 DIAGNOSIS — M545 Low back pain, unspecified: Secondary | ICD-10-CM

## 2022-01-25 ENCOUNTER — Ambulatory Visit: Payer: Commercial Managed Care - HMO

## 2022-02-13 ENCOUNTER — Ambulatory Visit: Payer: Commercial Managed Care - HMO | Admitting: Physical Therapy

## 2022-02-28 ENCOUNTER — Ambulatory Visit (INDEPENDENT_AMBULATORY_CARE_PROVIDER_SITE_OTHER): Payer: Commercial Managed Care - HMO | Admitting: Physician Assistant

## 2022-02-28 ENCOUNTER — Encounter: Payer: Self-pay | Admitting: Physician Assistant

## 2022-02-28 DIAGNOSIS — F411 Generalized anxiety disorder: Secondary | ICD-10-CM | POA: Diagnosis not present

## 2022-02-28 DIAGNOSIS — F331 Major depressive disorder, recurrent, moderate: Secondary | ICD-10-CM | POA: Diagnosis not present

## 2022-02-28 DIAGNOSIS — G4700 Insomnia, unspecified: Secondary | ICD-10-CM

## 2022-02-28 MED ORDER — DIVALPROEX SODIUM ER 500 MG PO TB24: ORAL_TABLET | ORAL | 5 refills | Status: DC

## 2022-02-28 MED ORDER — ESCITALOPRAM OXALATE 20 MG PO TABS: 20.0000 mg | ORAL_TABLET | Freq: Every day | ORAL | 5 refills | Status: DC

## 2022-02-28 MED ORDER — ARIPIPRAZOLE 2 MG PO TABS: 2.0000 mg | ORAL_TABLET | Freq: Every day | ORAL | 1 refills | Status: DC

## 2022-02-28 MED ORDER — CLONAZEPAM 1 MG PO TABS: 1.0000 mg | ORAL_TABLET | Freq: Two times a day (BID) | ORAL | 5 refills | Status: DC | PRN

## 2022-02-28 MED ORDER — TRAZODONE HCL 100 MG PO TABS: ORAL_TABLET | ORAL | 5 refills | Status: DC

## 2022-02-28 NOTE — Progress Notes (Signed)
[facility]  Patient ID: Latoya Clark,  MRN: [id number]  PCP: Wenda Low, MD  Date of Evaluation: 02/28/2022  time spent:30 minutes  Chief Complaint:  Chief Complaint   Anxiety; Depression    HISTORY/CURRENT STATUS: For routine med check. Sister Anderson Malta is with her.   Pt states she's been more depressed. Started a few mo ago.  Anhedonia. Low energy and motivation.  Doesn't do anything for fun. Appetite increased, stress eats. ADLs and personal hygiene nl.   Sleeps ok.    Denies any changes in concentration, making decisions, or remembering things.   Denies suicidal or homicidal thoughts.  Worries a lot about the state of the world. Afraid for her grandkids. Not having PA, just gets overwhelmed. Klonopin helps.  Patient denies increased energy with decreased need for sleep, increased talkativeness, racing thoughts, impulsivity or risky behaviors, increased spending, increased libido, grandiosity, increased irritability or anger, paranoia, or hallucinations.  Denies dizziness, syncope, seizures, numbness, tingling, tremor, tics, unsteady gait, slurred speech, confusion. Chronic muscle aches and joint pain. Denies unexplained weight loss, frequent infections, or sores that heal slowly.  No polyphagia, polydipsia, or polyuria. Denies visual changes or paresthesias.   Individual Medical History/ Review of Systems: Changes? :No     Past medications for mental health diagnoses include: VPA, Lexapro, Wellbutrin, Traz, Klonopin, Zoloft, Prozac, Ativan, Xanax, Lamictal caused a rash.  Allergies: Lamictal [lamotrigine], Lisinopril, Morphine and related, Penicillin g, Prednisone, and Codeine  Current Medications:  Current Outpatient Medications:    ARIPiprazole (ABILIFY) 2 MG tablet, Take 1 tablet (2 mg total) by mouth daily., Disp: 30 tablet, Rfl: 1   atorvastatin (LIPITOR) 80 MG tablet, Take 80 mg by mouth daily., Disp: , Rfl:    furosemide (LASIX) 20 MG tablet, TAKE  ONE TABLET BY MOUTH DAILY, Disp: 90 tablet, Rfl: 0   losartan (COZAAR) 50 MG tablet, Take 50 mg by mouth daily., Disp: , Rfl: 11   metoprolol succinate (TOPROL-XL) 25 MG 24 hr tablet, TAKE ONE TABLET BY MOUTH DAILY, Disp: , Rfl:    SYNTHROID 137 MCG tablet, Take 137 mcg by mouth daily., Disp: , Rfl: 6   traMADol (ULTRAM) 50 MG tablet, Take by mouth., Disp: , Rfl:    albuterol (VENTOLIN HFA) 108 (90 Base) MCG/ACT inhaler, , Disp: , Rfl:    cholecalciferol (VITAMIN D) 1000 units tablet, Take 1,000 Units by mouth daily. (Patient not taking: Reported on 07/28/2020), Disp: , Rfl:    clonazePAM (KLONOPIN) 1 MG tablet, Take 1 tablet (1 mg total) by mouth 2 (two) times daily as needed for anxiety., Disp: 60 tablet, Rfl: 5   divalproex (DEPAKOTE ER) 500 MG 24 hr tablet, TAKE THREE TABLETS BY MOUTH DAILY AT BEDTIME, Disp: 90 tablet, Rfl: 5   escitalopram (LEXAPRO) 20 MG tablet, Take 1 tablet (20 mg total) by mouth daily., Disp: 30 tablet, Rfl: 5   famotidine (PEPCID) 20 MG tablet, Take 1 tablet (20 mg total) by mouth 2 (two) times daily. (Patient not taking: Reported on 07/28/2020), Disp: , Rfl:    fluticasone furoate-vilanterol (BREO ELLIPTA) 200-25 MCG/INH AEPB, Inhale 1 puff into the lungs daily. (Patient not taking: Reported on 02/28/2022), Disp: 1 each, Rfl: 0   fluticasone furoate-vilanterol (BREO ELLIPTA) 200-25 MCG/INH AEPB, Inhale 1 puff into the lungs daily. (Patient not taking: Reported on 02/28/2022), Disp: 60 each, Rfl: 0   gabapentin (NEURONTIN) 100 MG capsule, Take 100 mg by mouth daily., Disp: , Rfl:    HYDROcodone bit-homatropine (HYCODAN) 5-1.5 MG/5ML syrup,  Take 5 mLs by mouth every 6 (six) hours as needed for cough. (Patient not taking: Reported on 10/27/2020), Disp: 240 mL, Rfl: 0   metroNIDAZOLE (FLAGYL) 500 MG tablet, Take 1 tablet (500 mg total) by mouth 2 (two) times daily. (Patient not taking: Reported on 07/25/2020), Disp: 14 tablet, Rfl: 0   Multiple Vitamin (MULTIVITAMIN) tablet, Take 1  tablet by mouth at bedtime. (Patient not taking: Reported on 02/23/2021), Disp: , Rfl:    nitroGLYCERIN (NITROSTAT) 0.4 MG SL tablet, Place under the tongue. (Patient not taking: Reported on 02/23/2021), Disp: , Rfl:    Omega-3 Fatty Acids (FISH OIL PO), Take 1 capsule by mouth at bedtime. (Patient not taking: Reported on 05/05/2021), Disp: , Rfl:    traZODone (DESYREL) 100 MG tablet, TAKE ONE TO TWO TABLETS BY MOUTH EVERY NIGHT AT BEDTIME AS NEEDED FOR SLEEP, Disp: 60 tablet, Rfl: 5 Medication Side Effects: none  Family Medical/ Social History: Changes? No  MENTAL HEALTH EXAM:  There were no vitals taken for this visit.There is no height or weight on file to calculate BMI.  General Appearance: Casual, Well Groomed, and Obese  Eye Contact:  Good  Speech:  Normal Rate  Volume:  Normal  Mood:  Depressed  Affect:  Depressed  Thought Process:  Goal Directed and Descriptions of Associations: Intact  Orientation:  Full (Time, Place, and Person)  Thought Content: Logical   Suicidal Thoughts:  No  Homicidal Thoughts:  No  Memory:  WNL  Judgement:  Good  Insight:  Good  Psychomotor Activity:  Normal  Concentration:  Concentration: Good  Recall:  Good  Fund of Knowledge: Good  Language: Good  Assets:  Desire for Improvement  ADL's:  Intact  Cognition: WNL  Prognosis:  Good   Labs 02/19/2022 by Dr. Deforest Hoyles  Depakote level 96,  total cholesterol 125, triglycerides 59, HDL 55, LDL 71, TSH 0.5  CBC normal with platelets 233 and  CMP and LFTs normal  DIAGNOSES:    ICD-10-CM   1. Major depressive disorder, recurrent episode, moderate (HCC)  F33.1     2. Generalized anxiety disorder  F41.1     3. Insomnia, unspecified type  G47.00      Receiving Psychotherapy: No    RECOMMENDATIONS:  PDMP was reviewed.  Last Klonopin filled 02/21/2022.  Tramadol known to me. I provided 30 minutes of face to face time during this encounter, including time spent before and after the visit in records  review, medical decision making, counseling pertinent to today's visit, and charting.   Disc depression and options for treatment. Recommend Abilify. Discussed potential metabolic side effects associated with atypical antipsychotics.  Labs will need to be drawn periodically to monitor metabolic function. Discussed potential risk for movement side effects. Patient understands and accepts these risks and has been advised to contact office if any movement side effects occur.   Start Abilify 2 mg, 1 po qd.  Continue Klonopin 1 mg, 1 p.o. twice daily as needed. Continue Depakote ER 500 mg, to 3 p.o. nightly.  Continue Lexapro 20 mg, 1 p.o. daily. Continue Gabapentin 100 mg, 1 qd (another provider.) Continue trazodone 100 mg, 1-2 nightly as needed sleep. Restart vitamins. Healthy lifestyle: exercise, healthier eating, limit portions. Sleep hygiene.  Strongly recommend therapy. Return in 4-6 weeks.   Donnal Moat, PA-C

## 2022-03-03 ENCOUNTER — Encounter: Payer: Self-pay | Admitting: Physician Assistant

## 2022-03-21 ENCOUNTER — Ambulatory Visit: Payer: Commercial Managed Care - HMO | Admitting: Physician Assistant

## 2022-03-21 ENCOUNTER — Ambulatory Visit
Admission: RE | Admit: 2022-03-21 | Discharge: 2022-03-21 | Disposition: A | Discharge status: Home / Self Care | Payer: Commercial Managed Care - HMO | Source: Ambulatory Visit | Attending: Internal Medicine | Admitting: Internal Medicine

## 2022-03-21 ENCOUNTER — Other Ambulatory Visit: Payer: Self-pay | Admitting: Internal Medicine

## 2022-03-21 DIAGNOSIS — M25512 Pain in left shoulder: Secondary | ICD-10-CM

## 2022-03-27 ENCOUNTER — Ambulatory Visit: Payer: Commercial Managed Care - HMO

## 2022-05-26 ENCOUNTER — Other Ambulatory Visit: Payer: Self-pay | Admitting: Acute Care

## 2022-05-26 DIAGNOSIS — Z87891 Personal history of nicotine dependence: Secondary | ICD-10-CM

## 2022-05-26 DIAGNOSIS — F17210 Nicotine dependence, cigarettes, uncomplicated: Secondary | ICD-10-CM

## 2022-05-26 DIAGNOSIS — Z122 Encounter for screening for malignant neoplasm of respiratory organs: Secondary | ICD-10-CM

## 2022-06-20 ENCOUNTER — Other Ambulatory Visit: Payer: Commercial Managed Care - HMO

## 2022-08-13 ENCOUNTER — Ambulatory Visit: Payer: Commercial Managed Care - HMO | Admitting: Physician Assistant

## 2022-08-13 ENCOUNTER — Other Ambulatory Visit: Payer: Self-pay | Admitting: Physician Assistant

## 2022-08-21 ENCOUNTER — Encounter: Payer: Self-pay | Admitting: Physician Assistant

## 2022-08-21 ENCOUNTER — Ambulatory Visit (INDEPENDENT_AMBULATORY_CARE_PROVIDER_SITE_OTHER): Payer: Commercial Managed Care - HMO | Admitting: Physician Assistant

## 2022-08-21 DIAGNOSIS — F319 Bipolar disorder, unspecified: Secondary | ICD-10-CM

## 2022-08-21 DIAGNOSIS — F3341 Major depressive disorder, recurrent, in partial remission: Secondary | ICD-10-CM

## 2022-08-21 DIAGNOSIS — Z79899 Other long term (current) drug therapy: Secondary | ICD-10-CM | POA: Diagnosis not present

## 2022-08-21 DIAGNOSIS — F17200 Nicotine dependence, unspecified, uncomplicated: Secondary | ICD-10-CM

## 2022-08-21 DIAGNOSIS — F411 Generalized anxiety disorder: Secondary | ICD-10-CM

## 2022-08-21 DIAGNOSIS — G4700 Insomnia, unspecified: Secondary | ICD-10-CM | POA: Diagnosis not present

## 2022-08-21 MED ORDER — DIVALPROEX SODIUM ER 500 MG PO TB24: ORAL_TABLET | ORAL | 1 refills | Status: DC

## 2022-08-21 MED ORDER — ESCITALOPRAM OXALATE 20 MG PO TABS: 20.0000 mg | ORAL_TABLET | Freq: Every day | ORAL | 1 refills | Status: DC

## 2022-08-21 MED ORDER — TRAZODONE HCL 100 MG PO TABS: ORAL_TABLET | ORAL | 1 refills | Status: DC

## 2022-08-21 MED ORDER — CLONAZEPAM 1 MG PO TABS: 1.0000 mg | ORAL_TABLET | Freq: Two times a day (BID) | ORAL | 5 refills | Status: DC | PRN

## 2022-08-21 NOTE — Progress Notes (Signed)
[facility]  Patient ID: Latoya Clark,  MRN: [id number]  PCP: Georgann Housekeeper, MD  Date of Evaluation: 08/21/2022  Time spent:20 minutes  Chief Complaint:  Chief Complaint   Anxiety; Depression; Insomnia; Follow-up    HISTORY/CURRENT STATUS: For routine med check.   She took the Abilify only for a few days. States it made her sleepy so she quit it.  Thinks all of her problems are situational with her husband and kids and no medication is going to help her.  She does not really enjoy anything.  She does not know what to do.  She has good days and bad days, more good than bad though.  States she feels sad when she has the bad days.  She is not over or under sleeping.  Appetite is normal and weight is stable.  She has an appointment next month with [facility], to help her quit smoking and lose weight.  Personal hygiene is normal.  ADLs are normal.  Focus and attention are normal for her.  No problems with her memory.  She still has a lot of anxiety if she does not take the Klonopin.  Not having panic attacks but she will just get overwhelmed if she does not take it.  No suicidal or homicidal thoughts.  Patient denies increased energy with decreased need for sleep, increased talkativeness, racing thoughts, impulsivity or risky behaviors, increased spending, increased libido, grandiosity, increased irritability or anger, paranoia, or hallucinations.  Denies dizziness, syncope, seizures, numbness, tingling, tremor, tics, unsteady gait, slurred speech, confusion. Chronic muscle aches and joint pain. Denies unexplained weight loss, frequent infections, or sores that heal slowly.  No polyphagia, polydipsia, or polyuria. Denies visual changes or paresthesias.   Individual Medical History/ Review of Systems: Changes? :No     Past medications for mental health diagnoses include: VPA, Lexapro, Wellbutrin, Traz, Klonopin, Zoloft, Prozac, Ativan, Xanax, Lamictal caused a  rash.  Allergies: Lamictal [lamotrigine], Lisinopril, Morphine and codeine, Penicillin g, Prednisone, and Codeine  Current Medications:  Current Outpatient Medications:    atorvastatin (LIPITOR) 80 MG tablet, Take 80 mg by mouth daily., Disp: , Rfl:    furosemide (LASIX) 20 MG tablet, TAKE ONE TABLET BY MOUTH DAILY, Disp: 90 tablet, Rfl: 0   losartan (COZAAR) 50 MG tablet, Take 50 mg by mouth daily., Disp: , Rfl: 11   metoprolol succinate (TOPROL-XL) 25 MG 24 hr tablet, TAKE ONE TABLET BY MOUTH DAILY, Disp: , Rfl:    SYNTHROID 137 MCG tablet, Take 137 mcg by mouth daily., Disp: , Rfl: 6   traMADol (ULTRAM) 50 MG tablet, Take by mouth., Disp: , Rfl:    albuterol (VENTOLIN HFA) 108 (90 Base) MCG/ACT inhaler, , Disp: , Rfl:    cholecalciferol (VITAMIN D) 1000 units tablet, Take 1,000 Units by mouth daily. (Patient not taking: Reported on 07/28/2020), Disp: , Rfl:    clonazePAM (KLONOPIN) 1 MG tablet, Take 1 tablet (1 mg total) by mouth 2 (two) times daily as needed for anxiety., Disp: 60 tablet, Rfl: 5   divalproex (DEPAKOTE ER) 500 MG 24 hr tablet, TAKE THREE TABLETS BY MOUTH EVERY NIGHT AT BEDTIME, Disp: 270 tablet, Rfl: 1   escitalopram (LEXAPRO) 20 MG tablet, Take 1 tablet (20 mg total) by mouth daily., Disp: 90 tablet, Rfl: 1   famotidine (PEPCID) 20 MG tablet, Take 1 tablet (20 mg total) by mouth 2 (two) times daily. (Patient not taking: Reported on 07/28/2020), Disp: , Rfl:    fluticasone furoate-vilanterol (BREO  ELLIPTA) 200-25 MCG/INH AEPB, Inhale 1 puff into the lungs daily. (Patient not taking: Reported on 02/28/2022), Disp: 1 each, Rfl: 0   fluticasone furoate-vilanterol (BREO ELLIPTA) 200-25 MCG/INH AEPB, Inhale 1 puff into the lungs daily. (Patient not taking: Reported on 02/28/2022), Disp: 60 each, Rfl: 0   gabapentin (NEURONTIN) 100 MG capsule, Take 100 mg by mouth daily., Disp: , Rfl:    HYDROcodone bit-homatropine (HYCODAN) 5-1.5 MG/5ML syrup, Take 5 mLs by mouth every 6 (six) hours  as needed for cough. (Patient not taking: Reported on 10/27/2020), Disp: 240 mL, Rfl: 0   metroNIDAZOLE (FLAGYL) 500 MG tablet, Take 1 tablet (500 mg total) by mouth 2 (two) times daily. (Patient not taking: Reported on 07/25/2020), Disp: 14 tablet, Rfl: 0   Multiple Vitamin (MULTIVITAMIN) tablet, Take 1 tablet by mouth at bedtime. (Patient not taking: Reported on 02/23/2021), Disp: , Rfl:    nitroGLYCERIN (NITROSTAT) 0.4 MG SL tablet, Place under the tongue. (Patient not taking: Reported on 02/23/2021), Disp: , Rfl:    Omega-3 Fatty Acids (FISH OIL PO), Take 1 capsule by mouth at bedtime. (Patient not taking: Reported on 05/05/2021), Disp: , Rfl:    traZODone (DESYREL) 100 MG tablet, TAKE ONE TO TWO TABLETS BY MOUTH EVERY NIGHT AT BEDTIME AS NEEDED FOR SLEEP, Disp: 180 tablet, Rfl: 1 Medication Side Effects: none  Family Medical/ Social History: Changes? No  MENTAL HEALTH EXAM:  There were no vitals taken for this visit.There is no height or weight on file to calculate BMI.  General Appearance: Casual, Well Groomed, and Obese  Eye Contact:  Fair  Speech:  Normal Rate  Volume:  Normal  Mood:  Euthymic  Affect:  Congruent  Thought Process:  Goal Directed and Descriptions of Associations: Intact  Orientation:  Full (Time, Place, and Person)  Thought Content: Logical   Suicidal Thoughts:  No  Homicidal Thoughts:  No  Memory:  WNL  Judgement:  Good  Insight:  Good  Psychomotor Activity:  Normal  Concentration:  Concentration: Good  Recall:  Good  Fund of Knowledge: Good  Language: Good  Assets:  Desire for Improvement  ADL's:  Intact  Cognition: WNL  Prognosis:  Good   DIAGNOSES:    ICD-10-CM   1. Generalized anxiety disorder  F41.1     2. Recurrent major depressive disorder, in partial remission (HCC)  F33.41     3. Insomnia, unspecified type  G47.00     4. Bipolar I disorder (HCC)  F31.9 Valproic acid level    5. Encounter for long-term (current) use of medications  Z79.899  Valproic acid level    6. Smoker  F17.200      Receiving Psychotherapy: No    RECOMMENDATIONS:  PDMP was reviewed.  Last Klonopin filled 08/08/2022.  Tramadol known to me. I provided 20 minutes of face to face time during this encounter, including time spent before and after the visit in records review, medical decision making, counseling pertinent to today's visit, and charting.   We discussed medication compliance.  She could have called to let me know she was having side effects with the Abilify and I could have prescribed something else.  She does not want to take any other medications and only wants to continue what she is currently on.  Patient understands that if something is not done within she will stay where she is right now with her mood.  Smoking cessation discussed at length.  She is hopeful that going to [facility]  cl[facility] will be able to help with smoking cessation as well as overeating.  She does not want help from me today to help with smoking.  Continue Klonopin 1 mg, 1 p.o. twice daily as needed. Continue Depakote ER 500 mg, to 3 p.o. nightly.  Continue Lexapro 20 mg, 1 p.o. daily. Continue Gabapentin 100 mg, 1 qd (another provider.) Continue trazodone 100 mg, 1-2 nightly as needed sleep. Restart vitamins. Healthy lifestyle: exercise, healthier eating, limit portions. Sleep hygiene.  Labs ordered for Depakote level to be done when she has labs drawn and [facility]. MUST go to therapy. Return in 6 months.             Melony Overly, PA-C

## 2022-09-11 DIAGNOSIS — Z0289 Encounter for other administrative examinations: Secondary | ICD-10-CM

## 2022-09-19 ENCOUNTER — Ambulatory Visit (INDEPENDENT_AMBULATORY_CARE_PROVIDER_SITE_OTHER): Payer: Self-pay | Admitting: Physician Assistant

## 2022-09-19 ENCOUNTER — Encounter (INDEPENDENT_AMBULATORY_CARE_PROVIDER_SITE_OTHER): Payer: Self-pay | Admitting: Physician Assistant

## 2022-09-19 VITALS — BP 126/82 | HR 86 | Temp 97.8°F | Ht 62.0 in | Wt 200.0 lb

## 2022-09-19 DIAGNOSIS — Z0289 Encounter for other administrative examinations: Secondary | ICD-10-CM

## 2022-09-19 DIAGNOSIS — J4489 Other specified chronic obstructive pulmonary disease: Secondary | ICD-10-CM

## 2022-09-19 DIAGNOSIS — J439 Emphysema, unspecified: Secondary | ICD-10-CM

## 2022-09-19 DIAGNOSIS — E039 Hypothyroidism, unspecified: Secondary | ICD-10-CM

## 2022-09-19 DIAGNOSIS — R7303 Prediabetes: Secondary | ICD-10-CM | POA: Insufficient documentation

## 2022-09-19 DIAGNOSIS — D860 Sarcoidosis of lung: Secondary | ICD-10-CM

## 2022-09-19 DIAGNOSIS — F419 Anxiety disorder, unspecified: Secondary | ICD-10-CM | POA: Insufficient documentation

## 2022-09-19 DIAGNOSIS — I10 Essential (primary) hypertension: Secondary | ICD-10-CM

## 2022-09-19 DIAGNOSIS — Z6836 Body mass index (BMI) 36.0-36.9, adult: Secondary | ICD-10-CM

## 2022-09-19 NOTE — Progress Notes (Signed)
Office: [phone]  /  Fax: [phone]   Initial Visit  Latoya Clark was seen in clinic today to evaluate for obesity. She is interested in losing weight to improve overall health and reduce the risk of weight related complications. She presents today to review program treatment options, initial physical assessment, and evaluation.     She was referred by: PCP  When asked what else they would like to accomplish? She states: Adopt healthier eating patterns, Improve energy levels and physical activity, Improve existing medical conditions, Reduce number of medications, Reduce risk for a surgery, Improve quality of life, Improve appearance, Improve self-confidence, and Lose a target amount of weight : 145 lbs  Weight history: Gained weight during pregnancy and gained and lost over the years, but never about to sustain weight loss. Did Weight watchers in the past.   When asked how has your weight affected you? She states: Has affected self-esteem, Relationships, Contributed to medical problems, Contributed to orthopedic problems or mobility issues, Having fatigue, Having poor endurance, Problems with eating patterns, and Has affected mood   Some associated conditions: Hypertension, Arthritis:Knee replacement and diffuse arthritis, Hyperlipidemia, Prediabetes, Overactive bladder, Heart disease, Lung disease, and Kidney disease  Contributing factors: Family history, Disruption of circadian rhythm, Nutritional, Medications, Stress, Reduced physical activity, Eating patterns, Mental health problems, Pregnancy, and Menopause  Weight promoting medications identified: Psychotropic medications and Steroids  Current nutrition plan: None  Current level of physical activity: Limited due to chronic pain or orthopedic problems and Walking  Current or previous pharmacotherapy: Phentermine and Other: Phen Phen many years ago  Response to medication: Had side effects so it was discontinued   Past  medical history includes:   Past Medical History:  Diagnosis Date   Active smoker    1 PPD   Anxiety    Arthritis    Colitis    Depression    Diverticulitis    History of IBS    Hyperlipidemia    Hypertension    Insomnia    Mastodynia    Osteopenia 03/2017   T score -1.6 FRAX 5.2%/0.2% stable from prior DEXA   Sarcoid    Thyroid disease    UTI (lower urinary tract infection)    Vaginal septum      Objective:   BP 126/82   Pulse 86   Temp 97.8 F (36.6 C)   Ht 5\' 2"  (1.575 m)   Wt 200 lb (90.7 kg)   SpO2 96%   BMI 36.58 kg/m  She was weighed on the bioimpedance scale: Body mass index is 36.58 kg/m.  Peak Weight:235 lbs , Body Fat%:46.6%, Visceral Fat Rating:14, Weight trend over the last 12 months: Unchanged  General:  Alert, oriented and cooperative. Patient is in no acute distress.  Respiratory: Normal respiratory effort, no problems with respiration noted   Gait: able to ambulate independently  Mental Status: Normal mood and affect. Normal behavior. Normal judgment and thought content.   DIAGNOSTIC DATA REVIEWED:  BMET    Component Value Date/Time   NA 135 04/28/2017 0615   K 3.8 04/28/2017 0615   CL 99 (L) 04/28/2017 0615   CO2 26 04/28/2017 0615   GLUCOSE 106 (H) 04/28/2017 0615   BUN <5 (L) 04/28/2017 0615   CREATININE 0.67 04/28/2017 0615   CALCIUM 8.0 (L) 04/28/2017 0615   CALCIUM 9.5 11/17/2010 1002   GFRNONAA >60 04/28/2017 0615   GFRAA >60 04/28/2017 0615   No results found for: "HGBA1C" No results found for: "INSULIN"  CBC    Component Value Date/Time   WBC 7.9 04/28/2017 0615   RBC 3.45 (L) 04/28/2017 0615   HGB 10.2 (L) 04/28/2017 0615   HCT 31.3 (L) 04/28/2017 0615   PLT 212 04/28/2017 0615   MCV 90.7 04/28/2017 0615   MCH 29.6 04/28/2017 0615   MCHC 32.6 04/28/2017 0615   RDW 12.5 04/28/2017 0615   Iron/TIBC/Ferritin/ %Sat No results found for: "IRON", "TIBC", "FERRITIN", "IRONPCTSAT" Lipid Panel     Component Value  Date/Time   CHOL 192 06/08/2008 0902   TRIG 65.0 06/08/2008 0902   HDL 62.00 06/08/2008 0902   CHOLHDL 3 06/08/2008 0902   VLDL 13.0 06/08/2008 0902   LDLCALC 117 (H) 06/08/2008 0902   Hepatic Function Panel     Component Value Date/Time   PROT 7.4 04/26/2017 1856   ALBUMIN 3.7 04/26/2017 1856   AST 20 04/26/2017 1856   ALT 16 04/26/2017 1856   ALKPHOS 47 04/26/2017 1856   BILITOT 0.9 04/26/2017 1856   BILIDIR 0.1 06/08/2008 0902      Component Value Date/Time   TSH 1.947 02/21/2016 1521   TSH 2.34 06/08/2008 0902     Assessment and Plan:   Acquired hypothyroidism  Sarcoidosis of lung (HCC)  Essential hypertension  Prediabetes  Anxiety  COPD with chronic bronchitis and emphysema (HCC)  Class 2 severe obesity due to excess calories with serious comorbidity and body mass index (BMI) of 36.0 to 36.9 in adult [facility])        Obesity Treatment / Action Plan:  Patient will work on garnering support from family and friends to begin weight loss journey. Will work on eliminating or reducing the presence of highly palatable, calorie dense foods in the home. Will complete provided nutritional and psychosocial assessment questionnaire before the next appointment. Will be scheduled for indirect calorimetry to determine resting energy expenditure in a fasting state.  This will allow [place] to create a reduced calorie, high-protein meal plan to promote loss of fat mass while preserving muscle mass. Will work on reducing intake of added sugars, simple sugars and processed carbs. Will avoid skipping meals which may result in increased hunger signals and overeating at certain times. Counseled on the health benefits of losing 5%-15% of total body weight. Was counseled on nutritional approaches to weight loss and benefits of reducing processed foods and consuming plant-based foods and high quality protein as part of nutritional weight management. Was counseled on pharmacotherapy and role  as an adjunct in weight management.   Obesity Education Performed Today:  She was weighed on the bioimpedance scale and results were discussed and documented in the synopsis.  We discussed obesity as a disease and the importance of a more detailed evaluation of all the factors contributing to the disease.  We discussed the importance of long term lifestyle changes which include nutrition, exercise and behavioral modifications as well as the importance of customizing this to her specific health and social needs.  We discussed the benefits of reaching a healthier weight to alleviate the symptoms of existing conditions and reduce the risks of the biomechanical, metabolic and psychological effects of obesity.  Latoya Clark appears to be in the action stage of change and states they are ready to start intensive lifestyle modifications and behavioral modifications.  30 minutes was spent today on this visit including the above counseling, pre-visit chart review, and post-visit documentation.  Reviewed by clinician on day of visit: allergies, medications, problem list, medical history, surgical history, family history,  social history, and previous encounter notes pertinent to obesity diagnosis.   Rondell Frick,PA-C

## 2022-10-01 ENCOUNTER — Ambulatory Visit (INDEPENDENT_AMBULATORY_CARE_PROVIDER_SITE_OTHER): Payer: Self-pay | Admitting: Family Medicine

## 2022-10-01 ENCOUNTER — Telehealth (INDEPENDENT_AMBULATORY_CARE_PROVIDER_SITE_OTHER): Payer: Self-pay | Admitting: Family Medicine

## 2022-10-01 NOTE — Telephone Encounter (Signed)
09/23 Pt drank coffee after receiving a $198.00 bill from [place]. She had decided not to attend the program thinking she could not afford it. [facility].

## 2022-10-15 ENCOUNTER — Ambulatory Visit (INDEPENDENT_AMBULATORY_CARE_PROVIDER_SITE_OTHER): Payer: Self-pay | Admitting: Family Medicine

## 2022-10-16 ENCOUNTER — Other Ambulatory Visit: Payer: Self-pay | Admitting: Physician Assistant

## 2022-10-17 ENCOUNTER — Ambulatory Visit (INDEPENDENT_AMBULATORY_CARE_PROVIDER_SITE_OTHER): Payer: Self-pay | Admitting: Family Medicine

## 2022-10-29 ENCOUNTER — Ambulatory Visit (INDEPENDENT_AMBULATORY_CARE_PROVIDER_SITE_OTHER): Payer: Self-pay | Admitting: Family Medicine

## 2022-10-31 ENCOUNTER — Ambulatory Visit (INDEPENDENT_AMBULATORY_CARE_PROVIDER_SITE_OTHER): Payer: Self-pay | Admitting: Family Medicine

## 2022-11-12 ENCOUNTER — Ambulatory Visit (INDEPENDENT_AMBULATORY_CARE_PROVIDER_SITE_OTHER): Payer: Self-pay | Admitting: Family Medicine

## 2022-11-26 ENCOUNTER — Ambulatory Visit (INDEPENDENT_AMBULATORY_CARE_PROVIDER_SITE_OTHER): Payer: Self-pay | Admitting: Family Medicine

## 2023-02-21 ENCOUNTER — Ambulatory Visit: Payer: Commercial Managed Care - HMO | Admitting: Physician Assistant

## 2023-03-14 ENCOUNTER — Ambulatory Visit: Admitting: Physician Assistant

## 2023-03-14 ENCOUNTER — Encounter: Payer: Self-pay | Admitting: Physician Assistant

## 2023-03-14 DIAGNOSIS — G4700 Insomnia, unspecified: Secondary | ICD-10-CM | POA: Diagnosis not present

## 2023-03-14 DIAGNOSIS — F411 Generalized anxiety disorder: Secondary | ICD-10-CM

## 2023-03-14 DIAGNOSIS — F331 Major depressive disorder, recurrent, moderate: Secondary | ICD-10-CM | POA: Diagnosis not present

## 2023-03-14 MED ORDER — TRAZODONE HCL 100 MG PO TABS: ORAL_TABLET | ORAL | 1 refills | Status: DC

## 2023-03-14 MED ORDER — DULOXETINE HCL 30 MG PO CPEP: 30.0000 mg | ORAL_CAPSULE | Freq: Every day | ORAL | 0 refills | Status: DC

## 2023-03-14 MED ORDER — CLONAZEPAM 1 MG PO TABS: 1.0000 mg | ORAL_TABLET | Freq: Two times a day (BID) | ORAL | 5 refills | Status: DC | PRN

## 2023-03-14 MED ORDER — DULOXETINE HCL 60 MG PO CPEP: 60.0000 mg | ORAL_CAPSULE | Freq: Every day | ORAL | 1 refills | Status: DC

## 2023-03-14 NOTE — Progress Notes (Signed)
 [facility]  Patient ID: Latoya Clark,  MRN: [id number]  PCP: Georgann Housekeeper, MD  Date of Evaluation: 03/14/2023 Time spent: 32 minutes  Chief Complaint:  Chief Complaint   Anxiety; Depression; Follow-up    HISTORY/CURRENT STATUS: For routine med check.   More depressed.  Her weight makes her more depressed. "My husband is awful. Just everyday stuff that comes up, money is a factor."  Doesn't enjoy much of anything, has low energy and motivation but keeps her house clean, takes care of her cats and dog. Hurts a lot in her knees and hips b/c she overweight.   Feels sad.  Goes to [place] to see her grandkids occas but her kids don't want to see her so that's depressing.  Cries easily.  Sleeps well most of the time. Personal hygiene is normal.   Denies any changes in concentration, making decisions, or remembering things.  Appetite has not changed.  Weight is stable.  Denies suicidal or homicidal thoughts.  She asks for a pill that will help anxiety and decrease appetite. Not having PA. More of being overwhelmed.  The Klonopin does help.  Patient denies increased energy with decreased need for sleep, increased talkativeness, racing thoughts, impulsivity or risky behaviors, increased spending, increased libido, grandiosity, increased irritability or anger, paranoia, or hallucinations.  Denies dizziness, syncope, seizures, numbness, tingling, tremor, tics, unsteady gait, slurred speech, confusion. Chronic muscle aches and joint pain.  Has been told she has fibromyalgia.  Denies unexplained weight loss, frequent infections, or sores that heal slowly.  No polyphagia, polydipsia, or polyuria. Denies visual changes or paresthesias.   Individual Medical History/ Review of Systems: Changes? :No     Past medications for mental health diagnoses include: VPA, Lexapro, Wellbutrin, Traz, Klonopin, Zoloft, Prozac, Ativan, Xanax, Lamictal caused a rash.  Allergies: Lamictal  [lamotrigine], Lisinopril, Morphine and codeine, Penicillin g, Prednisone, and Codeine  Current Medications:  Current Outpatient Medications:    atorvastatin (LIPITOR) 80 MG tablet, Take 80 mg by mouth daily., Disp: , Rfl:    divalproex (DEPAKOTE ER) 500 MG 24 hr tablet, TAKE THREE TABLETS BY MOUTH EVERY NIGHT AT BEDTIME, Disp: 270 tablet, Rfl: 1   DULoxetine (CYMBALTA) 30 MG capsule, Take 1 capsule (30 mg total) by mouth daily., Disp: 14 capsule, Rfl: 0   DULoxetine (CYMBALTA) 60 MG capsule, Take 1 capsule (60 mg total) by mouth daily. Begin after taking the 30 mg daily for 2 weeks., Disp: 30 capsule, Rfl: 1   furosemide (LASIX) 20 MG tablet, TAKE ONE TABLET BY MOUTH DAILY, Disp: 90 tablet, Rfl: 0   losartan (COZAAR) 50 MG tablet, Take 50 mg by mouth daily., Disp: , Rfl: 11   metoprolol succinate (TOPROL-XL) 25 MG 24 hr tablet, TAKE ONE TABLET BY MOUTH DAILY, Disp: , Rfl:    SYNTHROID 137 MCG tablet, Take 137 mcg by mouth daily., Disp: , Rfl: 6   traMADol (ULTRAM) 50 MG tablet, Take by mouth., Disp: , Rfl:    clonazePAM (KLONOPIN) 1 MG tablet, Take 1 tablet (1 mg total) by mouth 2 (two) times daily as needed for anxiety., Disp: 60 tablet, Rfl: 5   gabapentin (NEURONTIN) 100 MG capsule, Take 100 mg by mouth daily. (Patient not taking: Reported on 03/14/2023), Disp: , Rfl:    metroNIDAZOLE (FLAGYL) 500 MG tablet, Take 1 tablet (500 mg total) by mouth 2 (two) times daily. (Patient not taking: Reported on 03/14/2023), Disp: 14 tablet, Rfl: 0   traZODone (DESYREL) 100 MG tablet, TAKE ONE TO  TWO TABLETS BY MOUTH EVERY NIGHT AT BEDTIME AS NEEDED FOR SLEEP, Disp: 180 tablet, Rfl: 1 Medication Side Effects: none  Family Medical/ Social History: Changes? No  MENTAL HEALTH EXAM:  There were no vitals taken for this visit.There is no height or weight on file to calculate BMI.  General Appearance: Casual, Well Groomed, and Obese  Eye Contact:  Fair  Speech:  Normal Rate  Volume:  Normal  Mood:  Depressed   Affect:  Congruent  Thought Process:  Goal Directed and Descriptions of Associations: Intact  Orientation:  Full (Time, Place, and Person)  Thought Content: Logical   Suicidal Thoughts:  No  Homicidal Thoughts:  No  Memory:  WNL  Judgement:  Good  Insight:  Good  Psychomotor Activity:  Normal  Concentration:  Concentration: Good and Attention Span: Good  Recall:  Good  Fund of Knowledge: Good  Language: Good  Assets:  Financial Resources/Insurance Housing Transportation  ADL's:  Intact  Cognition: WNL  Prognosis:  Good   DIAGNOSES:    ICD-10-CM   1. Major depressive disorder, recurrent episode, moderate (HCC)  F33.1     2. Insomnia, unspecified type  G47.00     3. Generalized anxiety disorder  F41.1       Receiving Psychotherapy: No    RECOMMENDATIONS:  PDMP was reviewed.  Last Klonopin filled 02/10/2023.  Tramadol known to me. I provided 32 minutes of face to face time during this encounter, including time spent before and after the visit in records review, medical decision making, counseling pertinent to today's visit, and charting.   We discussed different treatment options.  Unfortunately there is no drug that helps with anxiety and weight loss.  Wellbutrin can curb the appetite however she took it in the past and it was not effective with depression.  It can increase anxiety and some people so that is not a good option.  I recommend changing to Cymbalta.  Which can help with depression, anxiety and myalgias.  Benefits, risks and side effects were discussed and she accepts.    She will call Dr. Paulita Fujita office and get labs sent over.  She is not sure if they did a Depakote level or not but they often do.  If it was not included in recent labs then I will order it at our next visit.  Wean off Lexapro 20 mg by taking 1/2 pill daily for 1 week and then stop.  At the same time she will start Cymbalta. Continue Klonopin 1 mg, 1 p.o. twice daily as needed. Continue Depakote  ER 500 mg, to 3 p.o. nightly.  Start Cymbalta 30 mg daily for 2 weeks and then increase to 60 mg. Continue trazodone 100 mg, 1-2 nightly as needed sleep. Recommend multivitamin, vitamin D, omega-3, and B complex. Healthy lifestyle: exercise, healthier eating, limit portions.  Recommend therapy. Return in 6-8 weeks.          Melony Overly, PA-C

## 2023-03-14 NOTE — Patient Instructions (Signed)
 Wean off the Escitalopram 20 mg, by taking 1/2 pill daily for 1 week, then stop. At the same time, start Cymbalta 30 mg daily. Then in 2 weeks after starting the Cymbalta, you'll increase the dose to 60 mg daily.

## 2023-03-25 ENCOUNTER — Other Ambulatory Visit: Payer: Self-pay | Admitting: Physician Assistant

## 2023-03-31 ENCOUNTER — Other Ambulatory Visit: Payer: Self-pay | Admitting: Physician Assistant

## 2023-04-05 ENCOUNTER — Ambulatory Visit: Payer: Commercial Managed Care - HMO | Admitting: Physician Assistant

## 2023-04-08 ENCOUNTER — Encounter: Payer: Self-pay | Admitting: Physician Assistant

## 2023-04-08 NOTE — Progress Notes (Signed)
 Please give her a call.  Depakote level is 15 which is extremely low.  If she even taking it?  If so, how much?  If she is taking it, should be 1500 mg, increase to 2000 mg and will need to repeat level in 1 week after the increase.  Thanks.

## 2023-04-16 ENCOUNTER — Other Ambulatory Visit: Payer: Self-pay

## 2023-04-16 ENCOUNTER — Emergency Department (HOSPITAL_COMMUNITY)
Admission: EM | Admit: 2023-04-16 | Discharge: 2023-04-16 | Discharge status: Against Medical Advice | Attending: Emergency Medicine | Admitting: Emergency Medicine

## 2023-04-16 ENCOUNTER — Encounter (HOSPITAL_COMMUNITY): Payer: Self-pay

## 2023-04-16 DIAGNOSIS — R110 Nausea: Secondary | ICD-10-CM | POA: Insufficient documentation

## 2023-04-16 DIAGNOSIS — R197 Diarrhea, unspecified: Secondary | ICD-10-CM | POA: Diagnosis not present

## 2023-04-16 DIAGNOSIS — E860 Dehydration: Secondary | ICD-10-CM | POA: Diagnosis not present

## 2023-04-16 DIAGNOSIS — R1031 Right lower quadrant pain: Secondary | ICD-10-CM | POA: Insufficient documentation

## 2023-04-16 DIAGNOSIS — R1032 Left lower quadrant pain: Secondary | ICD-10-CM | POA: Diagnosis not present

## 2023-04-16 DIAGNOSIS — Z5321 Procedure and treatment not carried out due to patient leaving prior to being seen by health care provider: Secondary | ICD-10-CM | POA: Insufficient documentation

## 2023-04-16 LAB — COMPREHENSIVE METABOLIC PANEL WITH GFR
ALT: 16 U/L (ref 0–44)
AST: 18 U/L (ref 15–41)
Albumin: 3.6 g/dL (ref 3.5–5.0)
Alkaline Phosphatase: 34 U/L — ABNORMAL LOW (ref 38–126)
Anion gap: 8 (ref 5–15)
BUN: 8 mg/dL (ref 8–23)
CO2: 26 mmol/L (ref 22–32)
Calcium: 8.7 mg/dL — ABNORMAL LOW (ref 8.9–10.3)
Chloride: 104 mmol/L (ref 98–111)
Creatinine, Ser: 0.66 mg/dL (ref 0.44–1.00)
GFR, Estimated: >60 mL/min (ref >60)
Glucose, Bld: 94 mg/dL (ref 70–99)
Potassium: 3.6 mmol/L (ref 3.5–5.1)
Sodium: 138 mmol/L (ref 135–145)
Total Bilirubin: 0.9 mg/dL (ref 0.0–1.2)
Total Protein: 6.2 g/dL — ABNORMAL LOW (ref 6.5–8.1)

## 2023-04-16 LAB — CBC
HCT: 40.7 % (ref 36.0–46.0)
Hemoglobin: 13.8 g/dL (ref 12.0–15.0)
MCH: 29.9 pg (ref 26.0–34.0)
MCHC: 33.9 g/dL (ref 30.0–36.0)
MCV: 88.1 fL (ref 80.0–100.0)
Platelets: 226 10*3/uL (ref 150–400)
RBC: 4.62 MIL/uL (ref 3.87–5.11)
RDW: 12.8 % (ref 11.5–15.5)
WBC: 8.2 10*3/uL (ref 4.0–10.5)
nRBC: 0 % (ref 0.0–0.2)

## 2023-04-16 LAB — LIPASE, BLOOD: Lipase: 24 U/L (ref 11–51)

## 2023-04-16 NOTE — ED Notes (Signed)
 Pt left d/t wait time after being encouraged to stay by sort tech.

## 2023-04-16 NOTE — ED Triage Notes (Signed)
 Pt c/o diarrhea worse than normalx1.5 wks. Pt c/o LLQ, RLQ abdominal pain, unsure how long. Pt c/o nauseated started yesterday

## 2023-04-17 ENCOUNTER — Encounter: Payer: Self-pay | Admitting: Acute Care

## 2023-04-24 ENCOUNTER — Ambulatory Visit: Admitting: Psychiatry

## 2023-04-25 ENCOUNTER — Other Ambulatory Visit (HOSPITAL_COMMUNITY): Payer: Self-pay | Admitting: Physician Assistant

## 2023-04-25 DIAGNOSIS — R197 Diarrhea, unspecified: Secondary | ICD-10-CM

## 2023-04-25 DIAGNOSIS — R10814 Left lower quadrant abdominal tenderness: Secondary | ICD-10-CM | POA: Diagnosis not present

## 2023-04-25 DIAGNOSIS — R10819 Abdominal tenderness, unspecified site: Secondary | ICD-10-CM | POA: Diagnosis not present

## 2023-04-30 ENCOUNTER — Ambulatory Visit (HOSPITAL_BASED_OUTPATIENT_CLINIC_OR_DEPARTMENT_OTHER)
Admission: RE | Admit: 2023-04-30 | Discharge: 2023-04-30 | Disposition: A | Discharge status: Home / Self Care | Source: Ambulatory Visit | Attending: Physician Assistant | Admitting: Physician Assistant

## 2023-04-30 DIAGNOSIS — R10814 Left lower quadrant abdominal tenderness: Secondary | ICD-10-CM | POA: Diagnosis not present

## 2023-04-30 DIAGNOSIS — K5730 Diverticulosis of large intestine without perforation or abscess without bleeding: Secondary | ICD-10-CM | POA: Diagnosis not present

## 2023-04-30 DIAGNOSIS — N289 Disorder of kidney and ureter, unspecified: Secondary | ICD-10-CM | POA: Diagnosis not present

## 2023-04-30 DIAGNOSIS — R197 Diarrhea, unspecified: Secondary | ICD-10-CM

## 2023-04-30 MED ORDER — IOHEXOL 300 MG/ML  SOLN
100.0000 mL | Freq: Once | INTRAMUSCULAR | Status: AC | PRN
  Administered 2023-04-30: 100 mL via INTRAVENOUS

## 2023-05-02 ENCOUNTER — Ambulatory Visit: Admitting: Physician Assistant

## 2023-05-02 DIAGNOSIS — Z0289 Encounter for other administrative examinations: Secondary | ICD-10-CM

## 2023-05-06 ENCOUNTER — Other Ambulatory Visit: Payer: Self-pay | Admitting: Internal Medicine

## 2023-05-06 DIAGNOSIS — Z1231 Encounter for screening mammogram for malignant neoplasm of breast: Secondary | ICD-10-CM

## 2023-05-09 DIAGNOSIS — J441 Chronic obstructive pulmonary disease with (acute) exacerbation: Secondary | ICD-10-CM | POA: Diagnosis not present

## 2023-05-15 ENCOUNTER — Ambulatory Visit (INDEPENDENT_AMBULATORY_CARE_PROVIDER_SITE_OTHER): Admitting: Physician Assistant

## 2023-05-15 ENCOUNTER — Encounter: Payer: Self-pay | Admitting: Physician Assistant

## 2023-05-15 DIAGNOSIS — F319 Bipolar disorder, unspecified: Secondary | ICD-10-CM | POA: Diagnosis not present

## 2023-05-15 DIAGNOSIS — F411 Generalized anxiety disorder: Secondary | ICD-10-CM

## 2023-05-15 DIAGNOSIS — G4700 Insomnia, unspecified: Secondary | ICD-10-CM | POA: Diagnosis not present

## 2023-05-15 NOTE — Progress Notes (Signed)
 [facility]  Patient ID: Latoya Clark,  MRN: [id number]  PCP: Jearldine Mina, MD  Date of Evaluation: 05/15/2023 Time spent:30 minutes  Chief Complaint:  Chief Complaint   Anxiety; Depression; Insomnia; Follow-up    HISTORY/CURRENT STATUS: Not doing well.  Had diarrhea she attributes to the Cymbalta , went to UC, they sent her to the the ER to rule out and treat for dehydration.  She had to wait for many hours and finally decided to leave.  She stopped the Cymbalta  and the diarrhea has almost completely resolved.  She went back on Lexapro  at 20 mg last week.  She has been under a tremendous amount of stress.  Her husband fell and broke his hip in 2 places since our last visit.  He did not need surgery and is recuperating at home.  That is very stressful for her because she has to take care of him 24-7.  They do have PT coming in to help with exercises.  Also under stress with her adult kids.  Not really any worse than normal though.  Her sister was diagnosed with breast cancer.  "It has just been a lot."  She is sad with everything that is going on, especially the relationship with her kids.  Sleeps well most of the time. ADLs and personal hygiene are normal.   Denies any changes in concentration, making decisions, or remembering things.  Appetite has not changed.  Weight is stable.  Still gets anxious.  Does not have full-blown panic attacks but does feel overwhelmed all the time.  The Klonopin  helps some.  Denies suicidal or homicidal thoughts.  Patient denies increased energy with decreased need for sleep, increased talkativeness, racing thoughts, impulsivity or risky behaviors, increased spending, increased libido, grandiosity, increased irritability or anger, paranoia, or hallucinations.  Denies dizziness, syncope, seizures, numbness, tingling, tremor, tics, unsteady gait, slurred speech, confusion. Denies muscle or joint pain, stiffness, or dystonia.Denies unexplained  weight loss, frequent infections, or sores that heal slowly.  No polyphagia, polydipsia, or polyuria. Denies visual changes or paresthesias.   Individual Medical History/ Review of Systems: Changes? :Yes see HPI  Past medications for mental health diagnoses include: VPA, Lexapro , Wellbutrin , Traz, Klonopin , Zoloft, Prozac, Ativan, Xanax , Lamictal  caused a rash.  Allergies: Lamictal  [lamotrigine ], Cymbalta  [duloxetine  hcl], Lisinopril, Morphine  and codeine, Penicillin g, Prednisone , and Codeine  Current Medications:  Current Outpatient Medications:    atorvastatin (LIPITOR) 80 MG tablet, Take 80 mg by mouth daily., Disp: , Rfl:    clonazePAM  (KLONOPIN ) 1 MG tablet, Take 1 tablet (1 mg total) by mouth 2 (two) times daily as needed for anxiety., Disp: 60 tablet, Rfl: 5   divalproex  (DEPAKOTE  ER) 500 MG 24 hr tablet, TAKE THREE TABLETS BY MOUTH EVERY NIGHT AT BEDTIME, Disp: 270 tablet, Rfl: 1   escitalopram  (LEXAPRO ) 20 MG tablet, Take 20 mg by mouth daily., Disp: , Rfl:    furosemide  (LASIX ) 20 MG tablet, TAKE ONE TABLET BY MOUTH DAILY, Disp: 90 tablet, Rfl: 0   losartan  (COZAAR ) 50 MG tablet, Take 50 mg by mouth daily., Disp: , Rfl: 11   metoprolol  succinate (TOPROL -XL) 25 MG 24 hr tablet, TAKE ONE TABLET BY MOUTH DAILY, Disp: , Rfl:    SYNTHROID  137 MCG tablet, Take 137 mcg by mouth daily., Disp: , Rfl: 6   traMADol (ULTRAM) 50 MG tablet, Take by mouth., Disp: , Rfl:    traZODone  (DESYREL ) 100 MG tablet, TAKE ONE TO TWO TABLETS BY MOUTH EVERY NIGHT AT BEDTIME AS  NEEDED FOR SLEEP, Disp: 180 tablet, Rfl: 1   gabapentin (NEURONTIN) 100 MG capsule, Take 100 mg by mouth daily. (Patient not taking: Reported on 05/15/2023), Disp: , Rfl:    metroNIDAZOLE  (FLAGYL ) 500 MG tablet, Take 1 tablet (500 mg total) by mouth 2 (two) times daily. (Patient not taking: Reported on 05/15/2023), Disp: 14 tablet, Rfl: 0 Medication Side Effects: diarrhea possibly d/t Cymbalta   Family Medical/ Social History: Changes? See  HPI  MENTAL HEALTH EXAM:  There were no vitals taken for this visit.There is no height or weight on file to calculate BMI.  General Appearance: Casual, Well Groomed, and Obese  Eye Contact:  Fair  Speech:  Normal Rate  Volume:  Normal  Mood:  Depressed  Affect:  Congruent  Thought Process:  Goal Directed and Descriptions of Associations: Intact  Orientation:  Full (Time, Place, and Person)  Thought Content: Logical   Suicidal Thoughts:  No  Homicidal Thoughts:  No  Memory:  WNL  Judgement:  Good  Insight:  Good  Psychomotor Activity:  Normal  Concentration:  Concentration: Good and Attention Span: Good  Recall:  Good  Fund of Knowledge: Good  Language: Good  Assets:  Desire for Improvement Financial Resources/Insurance Housing Transportation  ADL's:  Intact  Cognition: WNL  Prognosis:  Good   Labs 04/16/2023 CBC nl CMP Calcium 8.7, Total protein 6.2, Alk phos 34  04/03/2023 VPA level  15 (pt wasn't taking the depakote  consistently)  DIAGNOSES:    ICD-10-CM   1. Bipolar I disorder (HCC)  F31.9     2. Generalized anxiety disorder  F41.1     3. Insomnia, unspecified type  G47.00      Receiving Psychotherapy: No    RECOMMENDATIONS:  PDMP was reviewed.  Last Klonopin  filled 04/27/2023.  Also on tramadol.  Given hydrocodone  containing cough med on 05/09/2023. I provided 30 minutes of face to face time during this encounter, including time spent before and after the visit in records review, medical decision making, counseling pertinent to today's visit, and charting.   We discussed the Cymbalta  and Lexapro .  I asked her to contact me when she has any issues with medications that I prescribed.  I would not have started her back on the Lexapro  at 20 mg.  It has not bothered her however so she will continue that for now.  She has only been back on it 7 to 10 days.  She does have some situational depression but the Lexapro  has not had time to get back in her system to judge  efficacy.  No medication changes are needed.  We discussed medication compliance.  She was not taking the Depakote  regularly, the level was low at the last lab check.  States she has been taking it as directed for several weeks now.  Continue Klonopin  1 mg, 1 p.o. twice daily as needed. Continue Lexapro  20 mg, 1 p.o. daily. Continue Depakote  ER 500 mg, 3 p.o. nightly.  Continue trazodone  100 mg, 1-2 nightly as needed sleep. Recommend multivitamin, vitamin D , omega-3, and B complex. She'll get VPA level at PCP's office today.  Written order given. She will start seeing Reid Capuchin, LCSW for therapy. Return in 6-8 weeks.          Marvia Slocumb, PA-C

## 2023-05-16 ENCOUNTER — Ambulatory Visit
Admission: RE | Admit: 2023-05-16 | Discharge: 2023-05-16 | Disposition: A | Discharge status: Home / Self Care | Source: Ambulatory Visit | Attending: Acute Care | Admitting: Acute Care

## 2023-05-16 DIAGNOSIS — Z122 Encounter for screening for malignant neoplasm of respiratory organs: Secondary | ICD-10-CM | POA: Diagnosis not present

## 2023-05-16 DIAGNOSIS — F17210 Nicotine dependence, cigarettes, uncomplicated: Secondary | ICD-10-CM

## 2023-05-16 DIAGNOSIS — Z87891 Personal history of nicotine dependence: Secondary | ICD-10-CM

## 2023-05-17 ENCOUNTER — Other Ambulatory Visit: Payer: Self-pay | Admitting: Internal Medicine

## 2023-05-17 DIAGNOSIS — N6002 Solitary cyst of left breast: Secondary | ICD-10-CM

## 2023-05-22 ENCOUNTER — Encounter

## 2023-05-22 ENCOUNTER — Other Ambulatory Visit

## 2023-05-28 ENCOUNTER — Other Ambulatory Visit: Payer: Self-pay | Admitting: Physician Assistant

## 2023-05-30 ENCOUNTER — Ambulatory Visit

## 2023-05-30 ENCOUNTER — Ambulatory Visit
Admission: RE | Admit: 2023-05-30 | Discharge: 2023-05-30 | Disposition: A | Discharge status: Home / Self Care | Source: Ambulatory Visit | Attending: Internal Medicine | Admitting: Internal Medicine

## 2023-05-30 ENCOUNTER — Telehealth: Payer: Self-pay | Admitting: Physician Assistant

## 2023-05-30 DIAGNOSIS — N644 Mastodynia: Secondary | ICD-10-CM | POA: Diagnosis not present

## 2023-05-30 DIAGNOSIS — N6002 Solitary cyst of left breast: Secondary | ICD-10-CM

## 2023-05-30 NOTE — Telephone Encounter (Signed)
 Please see message.  She is showing she didn't cancel within 24 hours. I have not called her.

## 2023-05-30 NOTE — Telephone Encounter (Signed)
 Pt called asking for her NS fee from 05/02/23 be waived.

## 2023-06-11 ENCOUNTER — Ambulatory Visit: Admitting: Psychiatry

## 2023-06-11 DIAGNOSIS — F319 Bipolar disorder, unspecified: Secondary | ICD-10-CM
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
## 2023-07-02 ENCOUNTER — Other Ambulatory Visit: Payer: Self-pay | Admitting: Acute Care

## 2023-07-02 DIAGNOSIS — Z87891 Personal history of nicotine dependence: Secondary | ICD-10-CM

## 2023-07-02 DIAGNOSIS — Z122 Encounter for screening for malignant neoplasm of respiratory organs: Secondary | ICD-10-CM

## 2023-07-02 DIAGNOSIS — F17210 Nicotine dependence, cigarettes, uncomplicated: Secondary | ICD-10-CM

## 2023-07-03 ENCOUNTER — Ambulatory Visit: Admitting: Psychiatry

## 2023-07-03 DIAGNOSIS — F319 Bipolar disorder, unspecified: Secondary | ICD-10-CM

## 2023-07-03 NOTE — Progress Notes (Signed)
      [facility] Counselor/Therapist Progress Note  Patient ID: Latoya Clark, MRN: [id number],    Date: 07/03/2023  Time Spent: 53 minutes   Treatment Type: Individual Therapy  Reported Symptoms: anxiety, depression, sleep issues, tearfulness, angry at adult kids   Mental Status Exam:  Appearance:   Casual and Neat     Behavior:  Appropriate, Sharing, and Motivated  Motor:  Uses cane in walking  Speech/Language:   Clear and Coherent  Affect:  Depressed and anxious  Mood:  anxious and depressed  Thought process:  goal directed  Thought content:    Rumination  Sensory/Perceptual disturbances:    WNL  Orientation:  oriented to person, place, time/date, situation, day of week, month of year, year, and stated date of July 03, 2023  Attention:  Fair  Concentration:  Good  Memory:  Mild to moderate at times and plans to talk with new Dr soon  [employer] of knowledge:   Good and Fair  Insight:    Fair  Judgment:   Fair  Impulse Control:  Fair   Risk Assessment: Danger to Self:  No Self-injurious Behavior: No Danger to Others: No Duty to Warn:no Physical Aggression / Violence:No  Access to Firearms a concern: No  Gang Involvement:No   Subjective:   Patient in session today reporting past couple of weeks have been just as bad with husband and we live as room-mates. Reluctant initially but later opened up more in her sharing of information and commitment to working in therapy. Worries about money. My children bought my house a few years ago and we pay rent to my children. Patient's last appt was her initial evaluation and today continues to share some info that she did not share earlier re: family history about sister's including one that jumped off a bridge yrs ago and survived although that sister is bipolar also and we are friends.  Patient continued to share more about her past and hope for the future.  Working on committing to her tx goals. I have to learn how to calm down.  My husband makes me very anxious. Agreed to follow up at next appt.  Interventions: Cognitive Behavioral Therapy, Solution-Oriented/Positive Psychology, and Ego-Supportive 1.  Identify the conflicts and/or situations including from the past and in the present, that support current symptomology of anxiety/depression.  Set appropriate limits with others. 2.  Develop behavioral and cognitive strategies to reduce or eliminate excessive anxiety, depression.  Identify, challenge, and replace negative self talk with more positive, realistic, and empowering self talk. 3.  Patient will work on developing reality based positive cognitive messages that can help her improve her mood and outlook while also working on Environmental consultant.   Diagnosis:   ICD-10-CM   1. Bipolar I disorder (HCC)  F31.9      Plan:  Patient today in session and focusing further on some of her past and present particularly situations/issues that support current symptomology of anxiety/depression and the need to set appropriate limits with other people.  Also worked on some strategies to begin helping patient in managing anxiety and depression and replacing her negative self talk with more positive and empowering self talk.  Anxiety, bipolar, and depression she reports are her main symptoms. More motivation by session end.  Goal review and progress/challenges noted with patient.  Next appointment within 2-3 weeks.   Barnie Bunde, LCSW

## 2023-07-05 ENCOUNTER — Ambulatory Visit (INDEPENDENT_AMBULATORY_CARE_PROVIDER_SITE_OTHER): Admitting: Physician Assistant

## 2023-07-05 ENCOUNTER — Encounter: Payer: Self-pay | Admitting: Physician Assistant

## 2023-07-05 DIAGNOSIS — G4700 Insomnia, unspecified: Secondary | ICD-10-CM | POA: Diagnosis not present

## 2023-07-05 DIAGNOSIS — F319 Bipolar disorder, unspecified: Secondary | ICD-10-CM

## 2023-07-05 DIAGNOSIS — F411 Generalized anxiety disorder: Secondary | ICD-10-CM

## 2023-07-05 MED ORDER — ESCITALOPRAM OXALATE 20 MG PO TABS: 20.0000 mg | ORAL_TABLET | Freq: Every day | ORAL | 0 refills | Status: DC

## 2023-07-05 NOTE — Progress Notes (Unsigned)
 [facility] Med Check  Patient ID: Latoya Clark,  MRN: [id number]  PCP: Ransom Other, MD  Date of Evaluation:  07/05/2023 Time spent:25 minutes  Chief Complaint:  Chief Complaint   Anxiety; Depression; Follow-up    HISTORY/CURRENT STATUS: For routine med check  Has been back on psych meds reg for 1 1/2 months, feels a little better, it's hard to know how much b/c aggravated w/ h, tired of being care giver.  Stress is the same. No PA but when she gets overwhelmed, the Klonopin  helps. ADLs and hygiene are nl. Losing wt purposely. No extreme sadness, no memory changes, no SI/HI.  Patient denies increased energy with decreased need for sleep, increased talkativeness, racing thoughts, impulsivity or risky behaviors, increased spending, increased libido, grandiosity, increased irritability or anger, paranoia, or hallucinations.  Denies dizziness, syncope, seizures, numbness, tingling, tremor, tics, unsteady gait, slurred speech, confusion. Denies muscle or joint pain, stiffness, or dystonia. Denies unexplained weight loss, frequent infections, or sores that heal slowly.  No polyphagia, polydipsia, or polyuria. Denies visual changes or paresthesias.   Individual Medical History/ Review of Systems: Changes? :No   Past medications for mental health diagnoses include: VPA, Lexapro , Wellbutrin , Traz, Klonopin , Zoloft, Prozac, Ativan, Xanax , Lamictal  caused a rash.  Allergies: Lamictal  [lamotrigine ], Cymbalta  [duloxetine  hcl], Lisinopril, Morphine  and codeine, Penicillin g, Prednisone , and Codeine  Current Medications:  Current Outpatient Medications:    atorvastatin (LIPITOR) 80 MG tablet, Take 80 mg by mouth daily., Disp: , Rfl:    clonazePAM  (KLONOPIN ) 1 MG tablet, Take 1 tablet (1 mg total) by mouth 2 (two) times daily as needed for anxiety., Disp: 60 tablet, Rfl: 5   divalproex  (DEPAKOTE  ER) 500 MG 24 hr tablet, TAKE THREE TABLETS BY MOUTH EVERY NIGHT AT BEDTIME, Disp: 270 tablet,  Rfl: 1   furosemide  (LASIX ) 20 MG tablet, TAKE ONE TABLET BY MOUTH DAILY, Disp: 90 tablet, Rfl: 0   metoprolol  succinate (TOPROL -XL) 25 MG 24 hr tablet, TAKE ONE TABLET BY MOUTH DAILY, Disp: , Rfl:    SYNTHROID  137 MCG tablet, Take 137 mcg by mouth daily., Disp: , Rfl: 6   traMADol (ULTRAM) 50 MG tablet, Take by mouth., Disp: , Rfl:    traZODone  (DESYREL ) 100 MG tablet, TAKE ONE TO TWO TABLETS BY MOUTH EVERY NIGHT AT BEDTIME AS NEEDED FOR SLEEP, Disp: 180 tablet, Rfl: 1   escitalopram  (LEXAPRO ) 20 MG tablet, Take 1 tablet (20 mg total) by mouth daily., Disp: 90 tablet, Rfl: 0   gabapentin (NEURONTIN) 100 MG capsule, Take 100 mg by mouth daily. (Patient not taking: Reported on 05/15/2023), Disp: , Rfl:    losartan  (COZAAR ) 50 MG tablet, Take 50 mg by mouth daily. (Patient not taking: Reported on 07/05/2023), Disp: , Rfl: 11   metroNIDAZOLE  (FLAGYL ) 500 MG tablet, Take 1 tablet (500 mg total) by mouth 2 (two) times daily. (Patient not taking: Reported on 05/15/2023), Disp: 14 tablet, Rfl: 0 Medication Side Effects: diarrhea possibly d/t Cymbalta   Family Medical/ Social History: Changes? See HPI  MENTAL HEALTH EXAM:  There were no vitals taken for this visit.There is no height or weight on file to calculate BMI.  General Appearance: Casual, Well Groomed, and Obese  Eye Contact:  Fair  Speech:  Normal Rate  Volume:  Normal  Mood:  Euthymic  Affect:  Congruent  Thought Process:  Goal Directed and Descriptions of Associations: Intact  Orientation:  Full (Time, Place, and Person)  Thought Content: Logical   Suicidal Thoughts:  No  Homicidal Thoughts:  No  Memory:  WNL  Judgement:  Good  Insight:  Good  Psychomotor Activity:  Normal  Concentration:  Concentration: Good and Attention Span: Good  Recall:  Good  Fund of Knowledge: Good  Language: Good  Assets:  Communication Skills Desire for Improvement Financial Resources/Insurance Housing Transportation  ADL's:  Intact  Cognition: WNL   Prognosis:  Good   DIAGNOSES:    ICD-10-CM   1. Bipolar I disorder (HCC)  F31.9     2. Generalized anxiety disorder  F41.1     3. Insomnia, unspecified type  G47.00       Receiving Psychotherapy: Yes  Marval Bunde, LCSW   RECOMMENDATIONS:  PDMP was reviewed.  Last Klonopin  filled 07/01/2023. Tramadol known to me.  I provided 20 minutes of face to face time during this encounter, including time spent before and after the visit in records review, medical decision making, counseling pertinent to today's visit, and charting.   Her meds are helpful, but she's going through a lot of circumstantial things that make her feel down. She has started seeing a counselor recently and I believe that will help. Self-care daily w/ exercise and healthy diet is very important.   Continue Klonopin  1 mg, 1 p.o. twice daily as needed. Continue Lexapro  20 mg, 1 p.o. daily. Continue Depakote  ER 500 mg, 3 p.o. nightly.  Continue trazodone  100 mg, 1-2 nightly as needed sleep. Recommend multivitamin, vitamin D , omega-3, and B complex. Continue therapy Marval Bunde, LCSW. Get VPA result from Dr. Lunette Return in 2-3 mo.     Verneita Cooks, PA-C

## 2023-07-15 ENCOUNTER — Ambulatory Visit (INDEPENDENT_AMBULATORY_CARE_PROVIDER_SITE_OTHER): Payer: Self-pay | Admitting: Nurse Practitioner

## 2023-07-15 ENCOUNTER — Encounter: Payer: Self-pay | Admitting: Nurse Practitioner

## 2023-07-15 VITALS — BP 132/78 | HR 69 | Temp 97.0°F | Resp 18 | Ht 62.0 in | Wt 207.4 lb

## 2023-07-15 DIAGNOSIS — M150 Primary generalized (osteo)arthritis: Secondary | ICD-10-CM | POA: Diagnosis not present

## 2023-07-15 DIAGNOSIS — I25118 Atherosclerotic heart disease of native coronary artery with other forms of angina pectoris: Secondary | ICD-10-CM

## 2023-07-15 DIAGNOSIS — E785 Hyperlipidemia, unspecified: Secondary | ICD-10-CM | POA: Diagnosis not present

## 2023-07-15 DIAGNOSIS — F319 Bipolar disorder, unspecified: Secondary | ICD-10-CM

## 2023-07-15 DIAGNOSIS — E890 Postprocedural hypothyroidism: Secondary | ICD-10-CM | POA: Diagnosis not present

## 2023-07-15 DIAGNOSIS — I10 Essential (primary) hypertension: Secondary | ICD-10-CM

## 2023-07-15 DIAGNOSIS — G4700 Insomnia, unspecified: Secondary | ICD-10-CM

## 2023-07-15 DIAGNOSIS — F411 Generalized anxiety disorder: Secondary | ICD-10-CM | POA: Diagnosis not present

## 2023-07-15 DIAGNOSIS — R739 Hyperglycemia, unspecified: Secondary | ICD-10-CM | POA: Diagnosis not present

## 2023-07-15 DIAGNOSIS — Z1159 Encounter for screening for other viral diseases: Secondary | ICD-10-CM | POA: Diagnosis not present

## 2023-07-15 MED ORDER — ASPIRIN 81 MG PO TBEC: 81.0000 mg | DELAYED_RELEASE_TABLET | Freq: Every day | ORAL | Status: AC

## 2023-07-15 NOTE — Progress Notes (Signed)
 Careteam: Patient Care Team: Latoya Harlene POUR, NP as PCP - General (Geriatric Medicine)  PLACE OF SERVICE:  [facility]  Advanced Directive information Does Patient Have a Medical Advance Directive?: No, Would patient like information on creating a medical advance directive?: No - Patient declined  Allergies  Allergen Reactions   Lamictal  [Lamotrigine ] Itching   Cymbalta  [Duloxetine  Hcl] Diarrhea   Lisinopril Other (See Comments)   Morphine  And Codeine Other (See Comments)    Headaches    Penicillin G Other (See Comments)   Prednisone  Other (See Comments)    Widespread pain throughout body   Codeine Other (See Comments)    Must eat if taking codeine or pre-treat with a med for nausea    Chief Complaint  Patient presents with   Establish Care    New patient, Patient did bring her medications with her    HPI:  Discussed the use of AI scribe software for clinical note transcription with the patient, who gave verbal consent to proceed.  History of Present Illness The patient presents to establish care and review her current medications and medical history.  She has a history of bipolar disorder, managed with Depakote  500 mg daily, clonazepam  1 mg twice daily as needed for anxiety, and Lexapro  20 mg daily. Her grandfather was schizophrenic, and her sister also has bipolar disorder.  She has hypertension and taking metoprolol  and losartan   She has a history of coronary artery disease and was previously seen by cardiologist, they said to follow up PRN.  Hyperlipidemia, treated with atorvastatin 80 mg daily.   She underwent a thyroidectomy due to a goiter that caused swallowing difficulties and is on Synthroid  137 mcg daily. Her father died of thyroid  cancer at age 86.  She experiences generalized pain in her shoulders, back, hips, and knees, using tramadol 50 mg twice daily for pain. She has had an x-ray of her shoulders, received ineffective hip injections, and uses a  cane for walking due to hip pain. She had a right knee replacement and is considering another due to ongoing knee issues. She aims to lose 67 pounds to improve her condition.  She is prediabetic and focused on weight loss to prevent diabetes. She has a history of IBS, diverticulosis, and colitis.  She smokes three-fourths of a pack of cigarettes daily and has done so for 20 years, with an 18-year cessation period. She does not consume alcohol or use recreational drugs.  She has had multiple surgeries, including a thyroidectomy, tonsillectomy, knee replacement, gallbladder removal, and two C-sections.   Review of Systems:  Review of Systems  Constitutional:  Negative for chills, fever and weight loss.  HENT:  Negative for tinnitus.   Respiratory:  Negative for cough, sputum production and shortness of breath.   Cardiovascular:  Negative for chest pain, palpitations and leg swelling.  Gastrointestinal:  Negative for abdominal pain, constipation, diarrhea and heartburn.  Genitourinary:  Negative for dysuria, frequency and urgency.  Musculoskeletal:  Positive for back pain, joint pain and myalgias. Negative for falls.  Skin: Negative.   Neurological:  Negative for dizziness and headaches.  Psychiatric/Behavioral:  Negative for depression and memory loss. The patient has insomnia.     Past Medical History:  Diagnosis Date   Active smoker    1 PPD   Anxiety    Arthritis    Colitis    Depression    Diverticulitis    History of IBS    Hyperlipidemia    Hypertension  Insomnia    Mastodynia    Osteopenia 03/2017   T score -1.6 FRAX 5.2%/0.2% stable from prior DEXA   Sarcoid    Thyroid  disease    UTI (lower urinary tract infection)    Vaginal septum    Past Surgical History:  Procedure Laterality Date   CESAREAN SECTION  1981, 1984   CHOLECYSTECTOMY N/A 01/05/2013   Procedure: LAPAROSCOPIC CHOLECYSTECTOMY WITH attempted INTRAOPERATIVE CHOLANGIOGRAM;  Surgeon: Camellia CHRISTELLA Blush, MD;   Location: [facility] OR;  Service: General;  Laterality: N/A;   COPD     HYSTEROSCOPY  02/13/07   HYSTEROSCOPY, D&C, MIRENA INSERTED   REPLACEMENT TOTAL KNEE Right 05/12   THYROIDECTOMY  2002   TONSILLECTOMY     Social History:   reports that she has been smoking cigarettes. She has a 15 pack-year smoking history. She has never used smokeless tobacco. She reports that she does not currently use alcohol. She reports that she does not use drugs.  Family History  Problem Relation Age of Onset   Depression Mother    Anxiety disorder Mother    Depression Father    Cancer Father        angioplasty thyroid  carcenoma   Diabetes Father    Hypertension Father    Thyroid  disease Father    Bipolar disorder Sister    Hypertension Sister    Depression Maternal Grandmother    Anxiety disorder Maternal Grandmother    Schizophrenia Maternal Grandfather    Anxiety disorder Paternal Grandmother    Depression Paternal Grandmother    Cancer Paternal Grandmother        stomach   Diabetes Paternal Grandmother    Anxiety disorder Paternal Grandfather    Depression Paternal Grandfather    Diabetes Paternal Grandfather    Heart disease Paternal Grandfather    COPD Other    Hyperlipidemia Other     Medications: Patient's Medications  New Prescriptions   No medications on file  Previous Medications   ATORVASTATIN (LIPITOR) 80 MG TABLET    Take 80 mg by mouth daily.   CLONAZEPAM  (KLONOPIN ) 1 MG TABLET    Take 1 tablet (1 mg total) by mouth 2 (two) times daily as needed for anxiety.   DIVALPROEX  (DEPAKOTE  ER) 500 MG 24 HR TABLET    TAKE THREE TABLETS BY MOUTH EVERY NIGHT AT BEDTIME   ESCITALOPRAM  (LEXAPRO ) 20 MG TABLET    Take 1 tablet (20 mg total) by mouth daily.   FUROSEMIDE  (LASIX ) 20 MG TABLET    TAKE ONE TABLET BY MOUTH DAILY   GABAPENTIN (NEURONTIN) 100 MG CAPSULE    Take 100 mg by mouth daily.   LOSARTAN  (COZAAR ) 50 MG TABLET    Take 50 mg by mouth daily.   METOPROLOL  SUCCINATE (TOPROL -XL) 25  MG 24 HR TABLET    TAKE ONE TABLET BY MOUTH DAILY   METRONIDAZOLE  (FLAGYL ) 500 MG TABLET    Take 1 tablet (500 mg total) by mouth 2 (two) times daily.   SYNTHROID  137 MCG TABLET    Take 137 mcg by mouth daily.   TRAMADOL (ULTRAM) 50 MG TABLET    Take by mouth.   TRAZODONE  (DESYREL ) 100 MG TABLET    TAKE ONE TO TWO TABLETS BY MOUTH EVERY NIGHT AT BEDTIME AS NEEDED FOR SLEEP  Modified Medications   No medications on file  Discontinued Medications   No medications on file    Physical Exam:  Vitals:   07/15/23 1104  BP: 132/78  Pulse: 69  Resp:  18  Temp: (!) 97 F (36.1 C)  TempSrc: Temporal  SpO2: 97%  Weight: 207 lb 6.4 oz (94.1 kg)  Height: 5' 2 (1.575 m)   Body mass index is 37.93 kg/m. Wt Readings from Last 3 Encounters:  07/15/23 207 lb 6.4 oz (94.1 kg)  04/16/23 199 lb 15.3 oz (90.7 kg)  09/19/22 200 lb (90.7 kg)    Physical Exam Constitutional:      General: She is not in acute distress.    Appearance: She is well-developed. She is not diaphoretic.  HENT:     Head: Normocephalic and atraumatic.     Mouth/Throat:     Pharynx: No oropharyngeal exudate.  Eyes:     Conjunctiva/sclera: Conjunctivae normal.     Pupils: Pupils are equal, round, and reactive to light.  Cardiovascular:     Rate and Rhythm: Normal rate and regular rhythm.     Heart sounds: Normal heart sounds.  Pulmonary:     Effort: Pulmonary effort is normal.     Breath sounds: Normal breath sounds.  Abdominal:     General: Bowel sounds are normal.     Palpations: Abdomen is soft.  Musculoskeletal:     Cervical back: Normal range of motion and neck supple.     Right lower leg: No edema.     Left lower leg: No edema.  Skin:    General: Skin is warm and dry.  Neurological:     Mental Status: She is alert.  Psychiatric:        Mood and Affect: Mood normal.     Labs reviewed: Basic Metabolic Panel: Recent Labs    04/16/23 1531  NA 138  K 3.6  CL 104  CO2 26  GLUCOSE 94  BUN 8   CREATININE 0.66  CALCIUM 8.7*   Liver Function Tests: Recent Labs    04/16/23 1531  AST 18  ALT 16  ALKPHOS 34*  BILITOT 0.9  PROT 6.2*  ALBUMIN 3.6   Recent Labs    04/16/23 1531  LIPASE 24   No results for input(s): AMMONIA in the last 8760 hours. CBC: Recent Labs    04/16/23 1531  WBC 8.2  HGB 13.8  HCT 40.7  MCV 88.1  PLT 226   Lipid Panel: No results for input(s): CHOL, HDL, LDLCALC, TRIG, CHOLHDL, LDLDIRECT in the last 8760 hours. TSH: No results for input(s): TSH in the last 8760 hours. A1C: No results found for: HGBA1C   Assessment/Plan 1. Hyperlipidemia, unspecified hyperlipidemia type (Primary) -continues on atorvastatin 80 mg daily - Lipid panel  2. Bipolar 1 disorder (HCC) -controlled on current regimen Continues to follow up with psych.  - Valproic Acid  Level  3. Generalized anxiety disorder Reports a lot of anxiety, Continues to follow up with psych.   4. Essential hypertension -Blood pressure well controlled, goal bp <140/90 Continue current medications and dietary modifications follow metabolic panel - CBC with Differential/Platelet - Comprehensive metabolic panel with GFR  5. Postoperative hypothyroidism -reports possible abnormal TSH on last lab, will recheck today No recent changes in medication.  - TSH  7. Primary osteoarthritis involving multiple joints Ongoing, has pain in knees, hips, shoulder.  Was supposed to follow up with orthopedic but hasn't at this time -encouraged use of tylenol  prior to tramadol - has tramadol 50 mg by mouth as needed twice daily  8. Insomnia, unspecified type Managed with trazodone , followed by psych services  9. Hyperglycemia -dietary changes and to continue exercise.  - Hemoglobin  A1c  10. Coronary artery disease of native artery of native heart with stable angina pectoris [facility]) Noted by cardiologist in 2022- recommended ASA but she is not currently taking, will add at  this time Continues on metoprolol  - aspirin  EC 81 MG tablet; Take 1 tablet (81 mg total) by mouth daily. Swallow whole.    Follow up in initial AWV and 3 month follow up To sign record release  Erza Mothershead K. Latoya BODILY [facility] [phone]

## 2023-07-15 NOTE — Patient Instructions (Addendum)
 To make sure to sign record release from previous PCP  To schedule for first AWV in office.   Also schedule 3 month follow up.   Start enteric coated aspirin  81 mg by mouth daily due to heart disease

## 2023-07-16 ENCOUNTER — Ambulatory Visit: Payer: Self-pay | Admitting: Nurse Practitioner

## 2023-07-17 ENCOUNTER — Encounter: Payer: Self-pay | Admitting: Nurse Practitioner

## 2023-07-17 ENCOUNTER — Ambulatory Visit: Admitting: Nurse Practitioner

## 2023-07-17 VITALS — BP 132/84 | HR 71 | Temp 98.0°F | Ht 62.0 in | Wt 207.0 lb

## 2023-07-17 DIAGNOSIS — Z1159 Encounter for screening for other viral diseases: Secondary | ICD-10-CM

## 2023-07-17 DIAGNOSIS — Z23 Encounter for immunization: Secondary | ICD-10-CM

## 2023-07-17 DIAGNOSIS — Z1212 Encounter for screening for malignant neoplasm of rectum: Secondary | ICD-10-CM | POA: Diagnosis not present

## 2023-07-17 DIAGNOSIS — Z0000 Encounter for general adult medical examination without abnormal findings: Secondary | ICD-10-CM

## 2023-07-17 DIAGNOSIS — E2839 Other primary ovarian failure: Secondary | ICD-10-CM | POA: Diagnosis not present

## 2023-07-17 DIAGNOSIS — Z1211 Encounter for screening for malignant neoplasm of colon: Secondary | ICD-10-CM | POA: Diagnosis not present

## 2023-07-17 NOTE — Progress Notes (Signed)
 Subjective:   Latoya Clark is a 63 y.o. female who presents for Medicare Annual (Subsequent) preventive examination.  Visit Complete: In person psc   Cardiac Risk Factors include: advanced age (>61men, >29 women);dyslipidemia;hypertension;obesity (BMI >30kg/m2);smoking/ tobacco exposure     Objective:    Today's Vitals   07/17/23 1426 07/17/23 1456  BP: 132/84   Pulse: 71   Temp: 98 F (36.7 C)   SpO2: 97%   Weight: 207 lb (93.9 kg)   Height: 5' 2 (1.575 m)   PainSc:  6    Body mass index is 37.86 kg/m.     07/15/2023   11:19 AM 03/17/2020    2:10 PM 04/26/2017    6:34 PM 02/21/2016    2:13 PM 09/09/2015    1:11 PM 10/15/2013    9:57 AM 12/26/2012   12:10 PM  Advanced Directives  Does Patient Have a Medical Advance Directive? No No No  Yes  No  Yes  Patient does not have advance directive   Type of Advance Directive    Living will     Does patient want to make changes to medical advance directive?      Yes - information given    Would patient like information on creating a medical advance directive? No - Patient declined Yes (MAU/Ambulatory/Procedural Areas - Information given) No - Patient declined   No - patient declined information        Data saved with a previous flowsheet row definition    Current Medications (verified) Outpatient Encounter Medications as of 07/17/2023  Medication Sig   aspirin  EC 81 MG tablet Take 1 tablet (81 mg total) by mouth daily. Swallow whole.   atorvastatin (LIPITOR) 80 MG tablet Take 80 mg by mouth daily.   clonazePAM  (KLONOPIN ) 1 MG tablet Take 1 tablet (1 mg total) by mouth 2 (two) times daily as needed for anxiety.   divalproex  (DEPAKOTE  ER) 500 MG 24 hr tablet TAKE THREE TABLETS BY MOUTH EVERY NIGHT AT BEDTIME   escitalopram  (LEXAPRO ) 20 MG tablet Take 1 tablet (20 mg total) by mouth daily.   losartan  (COZAAR ) 50 MG tablet Take 50 mg by mouth daily.   metoprolol  succinate (TOPROL -XL) 25 MG 24 hr tablet TAKE ONE TABLET BY MOUTH  DAILY   SYNTHROID  137 MCG tablet Take 137 mcg by mouth daily.   traMADol (ULTRAM) 50 MG tablet Take by mouth.   traZODone  (DESYREL ) 100 MG tablet TAKE ONE TO TWO TABLETS BY MOUTH EVERY NIGHT AT BEDTIME AS NEEDED FOR SLEEP   No facility-administered encounter medications on file as of 07/17/2023.    Allergies (verified) Lamictal  [lamotrigine ], Cymbalta  [duloxetine  hcl], Lisinopril, Morphine  and codeine, Penicillin g, Prednisone , and Codeine   History: Past Medical History:  Diagnosis Date   Active smoker    1 PPD   Anxiety    Arthritis    Colitis    Depression    Diverticulitis    History of IBS    Hyperlipidemia    Hypertension    Insomnia    Mastodynia    Osteopenia 03/2017   T score -1.6 FRAX 5.2%/0.2% stable from prior DEXA   Sarcoid    Thyroid  disease    UTI (lower urinary tract infection)    Vaginal septum    Past Surgical History:  Procedure Laterality Date   CESAREAN SECTION  1981, 1984   CHOLECYSTECTOMY N/A 01/05/2013   Procedure: LAPAROSCOPIC CHOLECYSTECTOMY WITH attempted INTRAOPERATIVE CHOLANGIOGRAM;  Surgeon: Camellia CHRISTELLA Blush, MD;  Location:  [facility] OR;  Service: General;  Laterality: N/A;   COPD     HYSTEROSCOPY  02/13/07   HYSTEROSCOPY, D&C, MIRENA INSERTED   REPLACEMENT TOTAL KNEE Right 05/12   THYROIDECTOMY  2002   TONSILLECTOMY     Family History  Problem Relation Age of Onset   Depression Mother    Anxiety disorder Mother    Depression Father    Cancer Father        angioplasty thyroid  carcenoma   Diabetes Father    Hypertension Father    Thyroid  disease Father    Bipolar disorder Sister    Hypertension Sister    Depression Maternal Grandmother    Anxiety disorder Maternal Grandmother    Schizophrenia Maternal Grandfather    Anxiety disorder Paternal Grandmother    Depression Paternal Grandmother    Cancer Paternal Grandmother        stomach   Diabetes Paternal Grandmother    Anxiety disorder Paternal Grandfather    Depression Paternal  Grandfather    Diabetes Paternal Grandfather    Heart disease Paternal Grandfather    COPD Other    Hyperlipidemia Other    Social History   Socioeconomic History   Marital status: Married    Spouse name: Not on file   Number of children: 2   Years of education: Not on file   Highest education level: Some college, no degree  Occupational History   Occupation: disabled  Tobacco Use   Smoking status: Every Day    Current packs/day: 0.75    Average packs/day: 0.8 packs/day for 20.0 years (15.0 ttl pk-yrs)    Types: Cigarettes   Smokeless tobacco: Never  Vaping Use   Vaping status: Never Used  Substance and Sexual Activity   Alcohol use: Not Currently    Comment: Rare   Drug use: No   Sexual activity: Not Currently  Other Topics Concern   Not on file  Social History Narrative   Not on file   Social Drivers of Health   Financial Resource Strain: Not on file  Food Insecurity: No Food Insecurity (07/17/2023)   Hunger Vital Sign    Worried About Running Out of Food in the Last Year: Never true    Ran Out of Food in the Last Year: Never true  Transportation Needs: No Transportation Needs (07/17/2023)   PRAPARE - Administrator, Civil Service (Medical): No    Lack of Transportation (Non-Medical): No  Physical Activity: Not on file  Stress: Not on file  Social Connections: Unknown (03/15/2022)   Received from [facility]   Social Network    Social Network: Not on file    Tobacco Counseling Ready to quit: Not Answered Counseling given: Not Answered   Clinical Intake:  Pre-visit preparation completed: Yes  Pain : 0-10 Pain Score: 6  Pain Type: Chronic pain Pain Location:  (hip and low back) Pain Descriptors / Indicators: Aching, Constant Pain Onset: More than a month ago Pain Frequency: Constant     BMI - recorded: 37 Nutritional Risks: None  How often do you need to have someone help you when you read instructions, pamphlets, or other written  materials from your doctor or pharmacy?: 4 - Often         Activities of Daily Living    07/17/2023    2:52 PM  In your present state of health, do you have any difficulty performing the following activities:  Hearing? 0  Vision? 1  Difficulty concentrating or  making decisions? 1  Walking or climbing stairs? 1  Dressing or bathing? 0  Doing errands, shopping? 0  Preparing Food and eating ? N  Using the Toilet? N  In the past six months, have you accidently leaked urine? Y  Do you have problems with loss of bowel control? Y  Managing your Medications? Y  Managing your Finances? N  Housekeeping or managing your Housekeeping? N    Patient Care Team: [name], NP as PCP - General (Geriatric Medicine)  Indicate any recent Medical Services you may have received from other than [facility] providers in the past year (date may be approximate).     Assessment:   This is a routine wellness examination for Yaneliz.  Hearing/Vision screen Vision Screening - Comments:: No Eye Dr    Goals Addressed   None    Depression Screen    07/17/2023    2:32 PM 07/15/2023   11:16 AM 03/17/2020    2:12 PM  PHQ 2/9 Scores  PHQ - 2 Score 0 4 0  PHQ- 9 Score  21     Fall Risk    07/17/2023    2:31 PM 07/15/2023   11:16 AM 03/17/2020    2:12 PM  Fall Risk   Falls in the past year? 0 1 0  Number falls in past yr: 0 1 0  Injury with Fall? 0 1   Risk for fall due to : No Fall Risks History of fall(s)   Follow up Falls evaluation completed Falls evaluation completed     MEDICARE RISK AT HOME: Medicare Risk at Home Any stairs in or around the home?: Yes If so, are there any without handrails?: Yes Home free of loose throw rugs in walkways, pet beds, electrical cords, etc?: No Adequate lighting in your home to reduce risk of falls?: Yes Life alert?: No Use of a cane, walker or w/c?: Yes Grab bars in the bathroom?: Yes Shower chair or bench in shower?: Yes Elevated toilet seat or a  handicapped toilet?: Yes  TIMED UP AND GO:  Was the test performed?  No    Cognitive Function:        07/17/2023    2:33 PM  6CIT Screen  What Year? 0 points  What month? 0 points  What time? 0 points  Count back from 20 0 points  Months in reverse 0 points  Repeat phrase 0 points  Total Score 0 points    Immunizations Immunization History  Administered Date(s) Administered   Influenza Split 11/01/2011, 12/18/2019   Influenza,inj,Quad PF,6+ Mos 11/17/2019   Influenza-Unspecified 10/16/2022   PFIZER(Purple Top)SARS-COV-2 Vaccination 02/13/2019, 04/13/2019, 05/05/2019, 02/02/2020   Zoster Recombinant(Shingrix) 03/24/2019    TDAP status: Due, Education has been provided regarding the importance of this vaccine. Advised may receive this vaccine at local pharmacy or Health Dept. Aware to provide a copy of the vaccination record if obtained from local pharmacy or Health Dept. Verbalized acceptance and understanding.  Flu Vaccine status: Up to date  Pneumococcal vaccine status: Due, Education has been provided regarding the importance of this vaccine. Advised may receive this vaccine at local pharmacy or Health Dept. Aware to provide a copy of the vaccination record if obtained from local pharmacy or Health Dept. Verbalized acceptance and understanding.  Covid-19 vaccine status: Information provided on how to obtain vaccines.   Qualifies for Shingles Vaccine? Yes   Zostavax completed No   Shingrix Completed?: No.    Education has been provided regarding the  importance of this vaccine. Patient has been advised to call insurance company to determine out of pocket expense if they have not yet received this vaccine. Advised may also receive vaccine at local pharmacy or Health Dept. Verbalized acceptance and understanding.  Screening Tests Health Maintenance  Topic Date Due   Hepatitis C Screening  Never done   DTaP/Tdap/Td (1 - Tdap) Never done   Pneumococcal Vaccine: 50+ Years (1  of 2 - PCV) Never done   Colonoscopy  Never done   Zoster Vaccines- Shingrix (2 of 2) 05/19/2019   COVID-19 Vaccine (5 - 2024-25 season) 08/09/2023 (Originally 09/09/2022)   INFLUENZA VACCINE  08/09/2023   Cervical Cancer Screening (HPV/Pap Cotest)  03/23/2024   Medicare Annual Wellness (AWV)  07/16/2024   MAMMOGRAM  05/29/2025   DEXA SCAN  Completed   HIV Screening  Completed   Hepatitis B Vaccines  Aged Out   HPV VACCINES  Aged Out   Meningococcal B Vaccine  Aged Out    Health Maintenance  Health Maintenance Due  Topic Date Due   Hepatitis C Screening  Never done   DTaP/Tdap/Td (1 - Tdap) Never done   Pneumococcal Vaccine: 50+ Years (1 of 2 - PCV) Never done   Colonoscopy  Never done   Zoster Vaccines- Shingrix (2 of 2) 05/19/2019    Colorectal cancer screening: Referral to GI placed today. Pt aware the office will call re: appt.  Mammogram status: Completed 05/30/2023. Repeat every year  Bone Density status: Ordered today. Pt provided with contact info and advised to call to schedule appt.  Lung Cancer Screening: (Low Dose CT Chest recommended if Age 35-80 years, 20 pack-year currently smoking OR have quit w/in 15years.) does not qualify.   Lung Cancer Screening Referral: na  Additional Screening:  Hepatitis C Screening: does qualify; Complete today  Vision Screening: Recommended annual ophthalmology exams for early detection of glaucoma and other disorders of the eye. Is the patient up to date with their annual eye exam?  No  Who is the provider or what is the name of the office in which the patient attends annual eye exams? Does not have If pt is not established with a provider, would they like to be referred to a provider to establish care? No .   Dental Screening: Recommended annual dental exams for proper oral hygiene  Community Resource Referral / Chronic Care Management: CRR required this visit?  No   CCM required this visit?  No     Plan:     I have  personally reviewed and noted the following in the patient's chart:   Medical and social history Use of alcohol, tobacco or illicit drugs  Current medications and supplements including opioid prescriptions. Patient is not currently taking opioid prescriptions. Functional ability and status Nutritional status Physical activity Advanced directives List of other physicians Hospitalizations, surgeries, and ER visits in previous 12 months Vitals Screenings to include cognitive, depression, and falls Referrals and appointments  In addition, I have reviewed and discussed with patient certain preventive protocols, quality metrics, and best practice recommendations. A written personalized care plan for preventive services as well as general preventive health recommendations were provided to patient.     Harlene MARLA An, NP   07/17/2023

## 2023-07-17 NOTE — Patient Instructions (Signed)
  Latoya Clark , Thank you for taking time to come for your Medicare Wellness Visit. I appreciate your ongoing commitment to your health goals. Please review the following plan we discussed and let me know   To get shingles vaccine at local pharmacy  Also due for TDAP vaccine- can get at pharmacy    This is a list of the screening recommended for you and due dates:  Health Maintenance  Topic Date Due   Hepatitis C Screening  Never done   DTaP/Tdap/Td vaccine (1 - Tdap) Never done   Pneumococcal Vaccine for age over 5 (1 of 2 - PCV) Never done   Colon Cancer Screening  Never done   Zoster (Shingles) Vaccine (2 of 2) 05/19/2019   COVID-19 Vaccine (5 - 2024-25 season) 08/09/2023*   Flu Shot  08/09/2023   Pap with HPV screening  03/23/2024   Medicare Annual Wellness Visit  07/16/2024   Mammogram  05/29/2025   DEXA scan (bone density measurement)  Completed   HIV Screening  Completed   Hepatitis B Vaccine  Aged Out   HPV Vaccine  Aged Out   Meningitis B Vaccine  Aged Out  *Topic was postponed. The date shown is not the original due date.

## 2023-07-18 ENCOUNTER — Ambulatory Visit (INDEPENDENT_AMBULATORY_CARE_PROVIDER_SITE_OTHER): Payer: Self-pay | Admitting: Psychiatry

## 2023-07-18 DIAGNOSIS — F319 Bipolar disorder, unspecified: Secondary | ICD-10-CM

## 2023-07-18 LAB — LIPID PANEL
Cholesterol: 153 mg/dL (ref <200)
HDL: 73 mg/dL (ref >=50)
LDL Cholesterol (Calc): 66 mg/dL
Non-HDL Cholesterol (Calc): 80 mg/dL (ref <130)
Total CHOL/HDL Ratio: 2.1 (calc) (ref <5.0)
Triglycerides: 62 mg/dL (ref <150)

## 2023-07-18 LAB — HEMOGLOBIN A1C
Hgb A1c MFr Bld: 5.9 % — ABNORMAL HIGH (ref <5.7)
Mean Plasma Glucose: 123 mg/dL
eAG (mmol/L): 6.8 mmol/L

## 2023-07-18 LAB — CBC WITH DIFFERENTIAL/PLATELET
Absolute Lymphocytes: 4026 {cells}/uL — ABNORMAL HIGH (ref 850–3900)
Absolute Monocytes: 363 {cells}/uL (ref 200–950)
Basophils Absolute: 30 {cells}/uL (ref 0–200)
Basophils Relative: 0.4 %
Eosinophils Absolute: 141 {cells}/uL (ref 15–500)
Eosinophils Relative: 1.9 %
HCT: 45.1 % — ABNORMAL HIGH (ref 35.0–45.0)
Hemoglobin: 14.3 g/dL (ref 11.7–15.5)
MCH: 29.4 pg (ref 27.0–33.0)
MCHC: 31.7 g/dL — ABNORMAL LOW (ref 32.0–36.0)
MCV: 92.8 fL (ref 80.0–100.0)
MPV: 10.1 fL (ref 7.5–12.5)
Monocytes Relative: 4.9 %
Neutro Abs: 2842 {cells}/uL (ref 1500–7800)
Neutrophils Relative %: 38.4 %
Platelets: 252 Thousand/uL (ref 140–400)
RBC: 4.86 Million/uL (ref 3.80–5.10)
RDW: 12.7 % (ref 11.0–15.0)
Total Lymphocyte: 54.4 %
WBC: 7.4 Thousand/uL (ref 3.8–10.8)

## 2023-07-18 LAB — TEST AUTHORIZATION

## 2023-07-18 LAB — VALPROIC ACID LEVEL: Valproic Acid Lvl: 71.7 mg/L (ref 50.0–100.0)

## 2023-07-18 LAB — TSH: TSH: 0.65 m[IU]/L (ref 0.40–4.50)

## 2023-07-18 LAB — HEPATITIS C ANTIBODY: Hepatitis C Ab: NONREACTIVE

## 2023-07-18 NOTE — Progress Notes (Signed)
    Patient no-showed for appt and did not call office.  No Show fee is $50.

## 2023-07-25 ENCOUNTER — Other Ambulatory Visit: Payer: Self-pay | Admitting: Physician Assistant

## 2023-08-05 ENCOUNTER — Ambulatory Visit (INDEPENDENT_AMBULATORY_CARE_PROVIDER_SITE_OTHER): Admitting: Psychiatry

## 2023-08-05 ENCOUNTER — Other Ambulatory Visit: Payer: Self-pay | Admitting: Nurse Practitioner

## 2023-08-05 DIAGNOSIS — F319 Bipolar disorder, unspecified: Secondary | ICD-10-CM

## 2023-08-05 NOTE — Telephone Encounter (Unsigned)
 Copied from CRM [id number]. Topic: Clinical - Medication Refill >> Aug 05, 2023 11:16 AM Suzette B wrote: Medication:  traMADol  (ULTRAM ) 50 MG tablet  Has the patient contacted their pharmacy? Yes: patient stated that the pharmacy stated they were sending over a request to refill or renew the prescription  This is the patient's preferred pharmacy:  [facility] [id number] - [place], [place] - [address] [place] [address] Phone: [phone] Fax: [phone]  Is this the correct pharmacy for this prescription? Yes If no, delete pharmacy and type the correct one.   Has the prescription been filled recently? Yes  Is the patient out of the medication? No she has two days left for refill   Has the patient been seen for an appointment in the last year OR does the patient have an upcoming appointment? Yes  Can we respond through MyChart? No  Agent: Please be advised that Rx refills may take up to 3 business days. We ask that you follow-up with your pharmacy.

## 2023-08-05 NOTE — Progress Notes (Signed)
 [facility] Counselor/Therapist Progress Note  Patient ID: Latoya Clark, MRN: [id number],    Date: 08/05/2023  Time Spent: 50 minutes   Treatment Type: Individual Therapy  Reported Symptoms: anxiety, depression, sleep issues, angry at adult kids and at herself, tearfulness   Mental Status Exam:  Appearance:   Casual and Neat     Behavior:  Appropriate, Sharing, and Motivated  Motor:  Uses cane in walking  Speech/Language:   Clear and Coherent  Affect:  Depressed and anxious, irritable  Mood:  anxious, depressed, and irritable  Thought process:  goal directed  Thought content:    Rumination  Sensory/Perceptual disturbances:    WNL  Orientation:  oriented to person, place, time/date, situation, day of week, month of year, year, and stated date of August 05, 2023  Attention:  Fair  Concentration:  Fair  Memory:  WNL  Fund of knowledge:   Good and Fair  Insight:    Fair  Judgment:   Fair and Poor  Impulse Control:  Fair and Poor   Risk Assessment: Danger to Self:  No Self-injurious Behavior: No Danger to Others: No Duty to Warn:no Physical Aggression / Violence:No  Access to Firearms a concern: No  Gang Involvement:No   Subjective:  Patient  today working in session on her anxiety,depression, anger particularly with her 2 adult children, ages 61 and 60. They both live near [place], [place].  Adds they are also my main source of guilt. Feels she has hurt her adult kids in the past and they have little to do with me. Blaming others. Tearful and angry some within family. Concerned about her dog with possible kidney issues. Tearful and aggravated until she finally began to talk more sharing family stressors and frustrations, her own sadness. Self-defeating at times.  Irritability increased.  Makes bad choices and then does not like the results, which we discussed some in session today however patient's tolerance for this was low so we scaled back some and tried to make sure  we discussed things that she felt was manageable to her today.  Continues living with her husband as a roommate.  Shared and processed some frustrations with her adult children, who are also reportedly frustrated with her for some of the decisions she has made that they did not feel like were healthy decisions.  Reports having a sister that she is friends with but also that is a sister with whom she spoke about previously who jumped off a bridge years ago but did survive and is bipolar.  Acknowledges again today that I need to learn how to calm down.  Had stated earlier in session that was not what she wanted to focus on due to issues with her 2 adult kids and needing to share and process some of the details involved.  Encourage patient to have regular relaxation times alone and not distracted by others, and described in session how this could help her.  Interventions: Cognitive Behavioral Therapy, Solution-Oriented/Positive Psychology, and Ego-Supportive 1.  Identify the conflicts and/or situations including from the past and in the present, that support current symptomology of anxiety/depression.  Set appropriate limits with others. 2.  Develop behavioral and cognitive strategies to reduce or eliminate excessive anxiety, depression.  Identify, challenge, and replace negative self talk with more positive, realistic, and empowering self talk. 3.  Patient will work on developing reality based positive cognitive messages that can help her improve her mood and outlook while also working on Environmental consultant.  Diagnosis:   ICD-10-CM   1. Bipolar I disorder (HCC)  F31.9      Plan:   Patient today in session feeling anxious, depressed, and irritable.  Also some anger regarding certain family issues that she did not disclose totally and this was also feeding her depression and anxiety.  Talked through some of her irritability and actually got a little better before end of session.  Did seem to  realize that she was being supported in session and she responded favorably to that.  Discussed some of her depression and her negative self talk regarding herself and some family situations with adult daughters.  (Not all details included in this note due to patient privacy needs.  Did seem some more motivated and definitely less tearful by end of session and reviewed some goal-directed behaviors with her.  Goal review and progress/challenges noted with patient.  Next appointment within 2 to 3 weeks.   Barnie Bunde, LCSW

## 2023-08-05 NOTE — Telephone Encounter (Signed)
 Patient has request for refill on 08/05/2023. Patient hasn't been prescribed this medication with pcp. Patient doesn't have a contract on file. Medication pend and sent to PCP Arch, Jessica K, NP) for approval / refusal.

## 2023-08-06 ENCOUNTER — Telehealth: Payer: Self-pay

## 2023-08-06 DIAGNOSIS — M150 Primary generalized (osteo)arthritis: Secondary | ICD-10-CM

## 2023-08-06 NOTE — Telephone Encounter (Signed)
 Left a detailed messaage for patient requesting that she return call to discuss why she is taking tramadol  and how she takes tramadol , as the fax received from [facility] did not have any specific instructions.

## 2023-08-08 MED ORDER — TRAMADOL HCL 50 MG PO TABS: 50.0000 mg | ORAL_TABLET | Freq: Two times a day (BID) | ORAL | 0 refills | Status: DC | PRN

## 2023-08-08 NOTE — Telephone Encounter (Signed)
 Spoke with patient states she take tramadol  for knee, shoulder, and hip pain. Patient takes tramadol  twice daily  (scheduled). Patient emphasized that she is out of medication.

## 2023-08-08 NOTE — Telephone Encounter (Signed)
 Discussed with patient that I would like for her to use as needed pain.  To use tylenol  1000 mg every 8 hours first for pain management and if uncontrolled to use tramadol  as needed (not routinely twice daily).  Rx has been sent to the pharmacy, she will also need to have pain contract signed.

## 2023-08-26 ENCOUNTER — Ambulatory Visit: Admitting: Psychiatry

## 2023-08-26 DIAGNOSIS — F319 Bipolar disorder, unspecified: Secondary | ICD-10-CM

## 2023-08-26 NOTE — Progress Notes (Signed)
 [facility] Counselor/Therapist Progress Note  Patient ID: Latoya Clark, MRN: [id number],    Date: 08/26/2023  Time Spent: 53 minutes   Treatment Type: Individual Therapy  Reported Symptoms: anxiety, depression, anger at  herself, sleep issues, tearfulness   Mental Status Exam:  Appearance:   Casual     Behavior:  Appropriate and Sharing  Motor:  Normal  Speech/Language:   Clear and Coherent  Affect:  Depressed and anxious   Mood:  anxious, depressed, irritable, and sad  Thought process:  goal directed  Thought content:    Rumination  Sensory/Perceptual disturbances:    WNL  Orientation:  oriented to person, place, time/date, situation, day of week, month of year, year, and stated date of Aug. 18, 2025  Attention:  Fair  Concentration:  Good and Fair  Memory:  WNL  Fund of knowledge:   Good  Insight:    Good and Fair  Judgment:   Fair  Impulse Control:  Fair  (especially with money, motivation, words   Risk Assessment: Danger to Self:  No Self-injurious Behavior: No Danger to Others: No Duty to Warn:no Physical Aggression /: No  Access to Firearms a concern: No  Gang Involvement:No   Subjective:   Patient today focusing in session further on her anxiety, sadness about my family pulled apart and everybody fights, anger with herself (no longer angry at adult kids), some impulsiveness at times, and depression. Several unexpected frustrations with needed home repairs etc. Difficulty managing frustration, the unexpected things that happen.  States I want to do something in life, but not sure what. Hard for her to believe in herself. Talked more about her past in life including some growing up  issues due partly to her parents being off and on together and couldn't get along, ended up living more with her paternal grandmother who was a saint.  Processed more of her feelings that she grew up with and had followed her in some cases into her adult life.  Also  talked with patient about some ways of meeting up with other senior adults for activities at 2 different locations here in [place].  Provided patient with some information and contact numbers for these centers as other patients have gone and really enjoyed the activities.  Is working through a lot of anger and resentment which is very obvious in her temperament however today was not quite as irritable and participated more freely in session with encouragement.  Is concerned about her dog with some potential kidney issues.  Family stressors and frustrations continue.  Patient sometimes behaving and self-defeating ways which she agrees to work on more in sessions.  Not as irritable today.  Admits she does sometimes make bad choices and does not like the results and we plan to focus on this more in the future as well.  Overall, was not as irritable and was a little more engaged in the session right up to the and session today.  Follow-up more on regular relaxation times alone and not distracted by others as she is interested in being able to do this.  Is working on being able to calm down more effectively especially in relationship to her adult children.  Interventions: Cognitive Behavioral Therapy, Solution-Oriented/Positive Psychology, and Ego-Supportive 1.  Identify the conflicts and/or situations including from the past and in the present, that support current symptomology of anxiety/depression.  Set appropriate limits with others. 2.  Develop behavioral and cognitive strategies to reduce or eliminate excessive  anxiety, depression.  Identify, challenge, and replace negative self talk with more positive, realistic, and empowering self talk. 3.  Patient will work on developing reality based positive cognitive messages that can help her improve her mood and outlook while also working on Environmental consultant.   Diagnosis:   ICD-10-CM   1. Bipolar I disorder (HCC)  F31.9      Plan:   Patient more  actively involved in session today and working on her family issues, personal issues, depression, some impulsivity, and a willingness to be more active in therapy sessions which she was today.  She actually seemed to be a little pleased with herself by end of session.  Talked through more of her irritability which seemed to help and is working to reduce her negative self talk towards herself and towards her adult daughters.  Seems more motivated today and less tearfulness.  (Not all details included in this note due to patient privacy needs.)  Goal review and progress/challenges noted with patient.  Next appointment within 3 weeks.   Barnie Bunde, LCSW

## 2023-09-16 ENCOUNTER — Ambulatory Visit: Admitting: Psychiatry

## 2023-10-07 ENCOUNTER — Other Ambulatory Visit: Payer: Self-pay | Admitting: Nurse Practitioner

## 2023-10-07 ENCOUNTER — Ambulatory Visit: Admitting: Physician Assistant

## 2023-10-07 ENCOUNTER — Encounter: Payer: Self-pay | Admitting: Physician Assistant

## 2023-10-07 ENCOUNTER — Ambulatory Visit: Payer: Self-pay

## 2023-10-07 DIAGNOSIS — F319 Bipolar disorder, unspecified: Secondary | ICD-10-CM | POA: Diagnosis not present

## 2023-10-07 DIAGNOSIS — G4700 Insomnia, unspecified: Secondary | ICD-10-CM | POA: Diagnosis not present

## 2023-10-07 DIAGNOSIS — F411 Generalized anxiety disorder: Secondary | ICD-10-CM | POA: Diagnosis not present

## 2023-10-07 DIAGNOSIS — M150 Primary generalized (osteo)arthritis: Secondary | ICD-10-CM

## 2023-10-07 DIAGNOSIS — Z6379 Other stressful life events affecting family and household: Secondary | ICD-10-CM

## 2023-10-07 MED ORDER — DIVALPROEX SODIUM ER 500 MG PO TB24: ORAL_TABLET | ORAL | 1 refills | Status: AC

## 2023-10-07 MED ORDER — ESCITALOPRAM OXALATE 20 MG PO TABS: 20.0000 mg | ORAL_TABLET | Freq: Every day | ORAL | 1 refills | Status: AC

## 2023-10-07 MED ORDER — ALPRAZOLAM 1 MG PO TABS: 1.0000 mg | ORAL_TABLET | Freq: Two times a day (BID) | ORAL | 2 refills | Status: DC | PRN

## 2023-10-07 MED ORDER — TRAZODONE HCL 100 MG PO TABS: ORAL_TABLET | ORAL | 1 refills | Status: AC

## 2023-10-07 NOTE — Telephone Encounter (Unsigned)
 Copied from CRM #[id number]. Topic: Clinical - Medication Refill >> Oct 07, 2023 11:04 AM Antonio H wrote: Medication: traMADol  (ULTRAM ) 50 MG tablet  Has the patient contacted their pharmacy? Has no refills (Agent: If no, request that the patient contact the pharmacy for the refill. If patient does not wish to contact the pharmacy document the reason why and proceed with request.) (Agent: If yes, when and what did the pharmacy advise?)  This is the patient's preferred pharmacy:  [facility] [id number] - [place], [place] - [address] [place] [address] Phone: [phone] Fax: [phone]  Is this the correct pharmacy for this prescription? Yes If no, delete pharmacy and type the correct one.   Has the prescription been filled recently? No  Is the patient out of the medication? No but will soon be out   Has the patient been seen for an appointment in the last year OR does the patient have an upcoming appointment? Yes  Can we respond through MyChart? No  Agent: Please be advised that Rx refills may take up to 3 business days. We ask that you follow-up with your pharmacy.

## 2023-10-07 NOTE — Telephone Encounter (Signed)
 FYI Only or Action Required?: FYI only for provider.  Patient was last seen in primary care on 07/17/2023 by [name], NP.  Called Nurse Triage reporting Headache (Headache, back and knee pain).  Symptoms began several months ago.  Interventions attempted: OTC medications: Tylenol .  Symptoms are: gradually worsening.  Triage Disposition: See PCP Within 2 Weeks  Patient/caregiver understands and will follow disposition?: Yes  Copied from CRM [id number]. Topic: Clinical - Red Word Triage >> Oct 07, 2023 11:06 AM Latoya Clark wrote: Red Word that prompted transfer to Nurse Triage: Severe headache that started this morning, rates the pain an 8. Back and knees also hurting her. Also feels extremely exhausted.  Reason for Disposition  Headache is a chronic symptom (recurrent or ongoing AND present > 4 weeks)  Answer Assessment - Initial Assessment Questions New onset headache 6 months ago, chronically 8/10. C/o chronic back and knee pain as well. Taking tylenol . Blurred vision onset 1 year ago and dizziness and weakness for several years. Denies numbness or tingling. Coherent speech. Takes metoprolol  for htn but unsure of current BP. No cuff currently available and has not seen PCP recently. 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 Declines ED or UC. Appt scheduled with provider tomorrow.  1. LOCATION: Where does it hurt?      Headache, temple area 2. ONSET: When did the headache start? (e.g., minutes, hours, days)      6 months ago 3. PATTERN: Does the pain come and go, or has it been constant since it started?     Constant. 4. SEVERITY: How bad is the pain? and What does it keep you from doing?  (e.g., Scale 1-10; mild, moderate, or severe)     8/10 5. RECURRENT SYMPTOM: Have you ever had headaches before? If Yes, ask: When was the last time? and What happened that time?       Onset 1 year ago.  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 6. CAUSE: What do you think is causing the headache?     Unsure 7. MIGRAINE: Have you been diagnosed with migraine headaches? If Yes, ask: Is this headache similar?      1 year ago 8. HEAD INJURY: Has there been any recent injury to your head?      No 9. OTHER SYMPTOMS: Do you have any other symptoms? (e.g., fever, stiff neck, eye pain, sore throat, cold symptoms)     Blurred vision 1 year, dizziness several years, feels unsteady. Weakness.  Protocols used: [facility]

## 2023-10-07 NOTE — Progress Notes (Signed)
 [facility] Med Check  Patient ID: Latoya Clark,  MRN: [id number]  PCP: [name], NP  Date of Evaluation:  10/07/2023 Time spent:20 minutes  Chief Complaint:  Chief Complaint   Anxiety; Depression; Insomnia; Follow-up    HISTORY/CURRENT STATUS: For routine med check  Her husband is now on hospice.  At home, has kidney failure and CHF. He doesn't want her to get any help with him.  He acts like she doesn't do anything around the house all day.  He criticizes her all the time.  Sad a lot.  Energy and motivation are fair.   Sleeps fairly well, unless she has to get up with her husband.  ADLs and personal hygiene are normal.   No change in memory.  Appetite has not changed.  Anxiety isn't controlled well with the Klonopin .  It doesn't last as long as it used to. No mania, delirium, AH/VH.  No SI/HI.  Individual Medical History/ Review of Systems: Changes? :No   Past medications for mental health diagnoses include: VPA, Lexapro , Wellbutrin , Traz, Klonopin , Zoloft, Prozac, Ativan, Xanax , Lamictal  caused a rash.  Allergies: Lamictal  [lamotrigine ], Cymbalta  [duloxetine  hcl], Lisinopril, Morphine  and codeine, Penicillin g, Prednisone , and Codeine  Current Medications:  Current Outpatient Medications:    ALPRAZolam  (XANAX ) 1 MG tablet, Take 1 tablet (1 mg total) by mouth 2 (two) times daily as needed for anxiety., Disp: 60 tablet, Rfl: 2   aspirin  EC 81 MG tablet, Take 1 tablet (81 mg total) by mouth daily. Swallow whole., Disp: , Rfl:    atorvastatin (LIPITOR) 80 MG tablet, Take 80 mg by mouth daily., Disp: , Rfl:    losartan  (COZAAR ) 50 MG tablet, Take 50 mg by mouth daily., Disp: , Rfl: 11   metoprolol  succinate (TOPROL -XL) 25 MG 24 hr tablet, TAKE ONE TABLET BY MOUTH DAILY, Disp: , Rfl:    SYNTHROID  137 MCG tablet, Take 137 mcg by mouth daily., Disp: , Rfl: 6   traMADol  (ULTRAM ) 50 MG tablet, Take 1 tablet (50 mg total) by mouth 2 (two) times daily as needed., Disp: 60  tablet, Rfl: 0   divalproex  (DEPAKOTE  ER) 500 MG 24 hr tablet, TAKE THREE TABLETS BY MOUTH EVERY NIGHT AT BEDTIME, Disp: 270 tablet, Rfl: 1   escitalopram  (LEXAPRO ) 20 MG tablet, Take 1 tablet (20 mg total) by mouth daily., Disp: 90 tablet, Rfl: 1   traZODone  (DESYREL ) 100 MG tablet, TAKE ONE TO TWO TABLETS BY MOUTH EVERY NIGHT AT BEDTIME AS NEEDED FOR SLEEP, Disp: 180 tablet, Rfl: 1 Medication Side Effects: diarrhea possibly d/t Cymbalta   Family Medical/ Social History: Changes? See HPI  MENTAL HEALTH EXAM:  There were no vitals taken for this visit.There is no height or weight on file to calculate BMI.  General Appearance: Casual, Well Groomed, and Obese  Eye Contact:  Fair  Speech:  Normal Rate  Volume:  Normal  Mood:  sad  Affect:  Congruent  Thought Process:  Goal Directed and Descriptions of Associations: Intact  Orientation:  Full (Time, Place, and Person)  Thought Content: Logical   Suicidal Thoughts:  No  Homicidal Thoughts:  No  Memory:  WNL  Judgement:  Good  Insight:  Good  Psychomotor Activity:  Normal  Concentration:  Concentration: Good and Attention Span: Good  Recall:  Good  Fund of Knowledge: Good  Language: Good  Assets:  Communication Skills Desire for Improvement Financial Resources/Insurance Housing Transportation  ADL's:  Intact  Cognition: WNL  Prognosis:  Good   DIAGNOSES:  ICD-10-CM   1. Generalized anxiety disorder  F41.1     2. Bipolar I disorder (HCC)  F31.9     3. Insomnia, unspecified type  G47.00     4. Stress due to illness of family member  Z63.79      Receiving Psychotherapy: Yes  Marval Bunde, LCSW   RECOMMENDATIONS:  PDMP was reviewed.  Last Klonopin  filled 09/07/2023.  Tramadol  known to me.  I provided approximately  20 minutes of face to face time during this encounter, including time spent before and after the visit in records review, medical decision making, counseling pertinent to today's visit, and charting.    Discontinue Klonopin  and change to Xanax . It works quicker than Klonopin  does. The effects may not last as long though.  She would like to change.   Start Xanax  1 mg, 1 bid prn.  Continue Lexapro  20 mg, 1 p.o. daily. Continue Depakote  ER 500 mg, 3 p.o. nightly.  Continue trazodone  100 mg, 1-2 nightly as needed sleep. Recommend multivitamin, vitamin D , omega-3, and B complex. Continue therapy Marval Bunde, LCSW. Return in 6-8 weeks.   Verneita Cooks, PA-C

## 2023-10-07 NOTE — Telephone Encounter (Signed)
 Patient is requesting a refill of the following medications: Requested Prescriptions   Pending Prescriptions Disp Refills   traMADol  (ULTRAM ) 50 MG tablet 60 tablet 0    Sig: Take 1 tablet (50 mg total) by mouth 2 (two) times daily as needed.    Date of last refill: 08/08/2023  Refill amount: 60/0  Treatment agreement date: Patient has upcoming appointment on 11/01/23

## 2023-10-08 ENCOUNTER — Encounter: Admitting: Sports Medicine

## 2023-10-08 MED ORDER — TRAMADOL HCL 50 MG PO TABS: 50.0000 mg | ORAL_TABLET | Freq: Two times a day (BID) | ORAL | 0 refills | Status: AC | PRN

## 2023-10-09 NOTE — Progress Notes (Signed)
 This encounter was created in error - please disregard.

## 2023-10-21 ENCOUNTER — Ambulatory Visit: Payer: Self-pay

## 2023-10-21 NOTE — Telephone Encounter (Signed)
 Message routed to Dr.Veludandi as FYI. Patient placed on schedule for tomorrow.

## 2023-10-21 NOTE — Telephone Encounter (Signed)
 FYI Only or Action Required?: FYI only for provider.  Patient was last seen in primary care on 07/17/2023 by [name], NP.  Called Nurse Triage reporting Urinary Frequency.  Symptoms began a week ago.  Interventions attempted: Nothing.  Symptoms are: gradually worsening.  Triage Disposition: See Physician Within 24 Hours  Patient/caregiver understands and will follow disposition?: Yes       Copied from CRM [id number]. Topic: Clinical - Red Word Triage >> Oct 21, 2023 12:57 PM Fredrica W wrote: Red Word that prompted transfer to Nurse Triage: Horrible back pain possible kidney or bladder infection pressure frequent urination urge but then trickles out Reason for Disposition  Urinating more frequently than usual (i.e., frequency) OR new-onset of the feeling of an urgent need to urinate (i.e., urgency)  Answer Assessment - Initial Assessment Questions Pt states she has back pain and urinary frequency. She states she feels like she has to go and then it just trickles out, feels burning down there but no burning with urination.     1. SYMPTOM: What's the main symptom you're concerned about? (e.g., frequency, incontinence)      2. ONSET: When did the  pressure  start?     About a week 3. PAIN: Is there any pain? If Yes, ask: How bad is it? (Scale: 1-10; mild, moderate, severe)     10/10 4. CAUSE: What do you think is causing the symptoms?     Thinks UTI 5. OTHER SYMPTOMS: Do you have any other symptoms? (e.g., blood in urine, fever, flank pain, pain with urination)     Unable to check temperature,  Protocols used: Urinary Symptoms-A-AH

## 2023-10-22 ENCOUNTER — Ambulatory Visit: Admitting: Sports Medicine

## 2023-10-23 ENCOUNTER — Ambulatory Visit (INDEPENDENT_AMBULATORY_CARE_PROVIDER_SITE_OTHER): Admitting: Sports Medicine

## 2023-10-23 ENCOUNTER — Encounter: Payer: Self-pay | Admitting: Sports Medicine

## 2023-10-23 VITALS — BP 136/84 | HR 78 | Temp 98.1°F | Resp 14 | Ht 62.0 in | Wt 209.2 lb

## 2023-10-23 DIAGNOSIS — R1030 Lower abdominal pain, unspecified: Secondary | ICD-10-CM

## 2023-10-23 DIAGNOSIS — R300 Dysuria: Secondary | ICD-10-CM | POA: Diagnosis not present

## 2023-10-23 DIAGNOSIS — G8929 Other chronic pain: Secondary | ICD-10-CM

## 2023-10-23 DIAGNOSIS — M545 Low back pain, unspecified: Secondary | ICD-10-CM | POA: Diagnosis not present

## 2023-10-23 LAB — POCT URINALYSIS DIPSTICK
Bilirubin, UA: NEGATIVE
Blood, UA: NEGATIVE
Glucose, UA: NEGATIVE
Nitrite, UA: NEGATIVE
Protein, UA: POSITIVE — AB
Spec Grav, UA: >=1.03 — AB (ref 1.010–1.025)
Urobilinogen, UA: 0.2 U/dL
pH, UA: 5 (ref 5.0–8.0)

## 2023-10-23 MED ORDER — CIPROFLOXACIN HCL 500 MG PO TABS: 500.0000 mg | ORAL_TABLET | Freq: Two times a day (BID) | ORAL | 0 refills | Status: AC

## 2023-10-23 NOTE — Progress Notes (Signed)
 Careteam: Patient Care Team: Latoya Harlene POUR, NP as PCP - General (Geriatric Medicine)  PLACE OF SERVICE:  [facility]  Advanced Directive information    Allergies  Allergen Reactions   Lamictal  [Lamotrigine ] Itching   Cymbalta  [Duloxetine  Hcl] Diarrhea   Lisinopril Other (See Comments)    cough   Morphine  And Codeine Other (See Comments)    Headaches    Penicillin G Other (See Comments)   Prednisone  Other (See Comments)    Widespread pain throughout body   Codeine Other (See Comments)    Must eat if taking codeine or pre-treat with a med for nausea    Chief Complaint  Patient presents with   Dysuria   Acute Visit    Painful urine , a lot of pressure // pt stated her lower back is hurting her. Patient stated she would have to use the bathroom really bad and when going to the bathroom she's unable to urinate. Patient is also very exhausted.      Discussed the use of AI scribe software for clinical note transcription with the patient, who gave verbal consent to proceed.  History of Present Illness Pt did not bring her medications , says she is not taking losartan  and not sure about metoprolol   Latoya Clark is a 63 year old female with IBS and bipolar disorder who presents with severe lower abdominal and back pain.  She has been experiencing severe lower abdominal and back pain for the past two weeks. The abdominal pain is described as a burning sensation. She has a constant urge to urinate, with only small amounts being passed each time, and denies hematuria. She feels exhausted and is currently caring for her husband, who is in hospice care.   She has a history of IBS and had diarrhea over the weekend after consuming high-fiber foods, which resolved with Imodium. No recent falls or back injuries, and the back pain does not radiate to her legs. .  She has a history of bipolar disorder and takes multiple medications, though she is unsure of all the names and  dosages. She mentions previously taking losartan  and metoprolol  for blood pressure but is unclear about her current regimen. She is under the care of Latoya Clark at [facility].  No fever, cough, or breathing difficulties. She reports feeling cold and then hot at times. She has not eaten breakfast or lunch today and sometimes feels nauseous. No numbness or weakness in her legs, though she mentions needing knee replacements.    Review of Systems:  Review of Systems  Constitutional:  Negative for chills and fever.  HENT:  Negative for congestion and sore throat.   Respiratory:  Negative for cough, sputum production and shortness of breath.   Cardiovascular:  Negative for chest pain, palpitations and leg swelling.  Gastrointestinal:  Positive for abdominal pain and nausea. Negative for heartburn.  Genitourinary:  Positive for dysuria. Negative for frequency and hematuria.  Musculoskeletal:  Positive for back pain.  Neurological:  Negative for dizziness.   Negative unless indicated in HPI.   Past Medical History:  Diagnosis Date   Active smoker    1 PPD   Anxiety    Arthritis    Colitis    Depression    Diverticulitis    History of IBS    Hyperlipidemia    Hypertension    Insomnia    Mastodynia    Osteopenia 03/2017   T score -1.6 FRAX 5.2%/0.2% stable from prior DEXA  Sarcoid    Thyroid  disease    UTI (lower urinary tract infection)    Vaginal septum    Past Surgical History:  Procedure Laterality Date   CESAREAN SECTION  1981, 1984   CHOLECYSTECTOMY N/A 01/05/2013   Procedure: LAPAROSCOPIC CHOLECYSTECTOMY WITH attempted INTRAOPERATIVE CHOLANGIOGRAM;  Surgeon: Latoya CHRISTELLA Blush, MD;  Location: [facility] OR;  Service: General;  Laterality: N/A;   COPD     HYSTEROSCOPY  02/13/07   HYSTEROSCOPY, D&C, MIRENA INSERTED   REPLACEMENT TOTAL KNEE Right 05/12   THYROIDECTOMY  2002   TONSILLECTOMY     Social History:   reports that she has been smoking cigarettes. She has a 15  pack-year smoking history. She has never used smokeless tobacco. She reports that she does not currently use alcohol. She reports that she does not use drugs.  Family History  Problem Relation Age of Onset   Depression Mother    Anxiety disorder Mother    Depression Father    Cancer Father        angioplasty thyroid  carcenoma   Diabetes Father    Hypertension Father    Thyroid  disease Father    Bipolar disorder Sister    Hypertension Sister    Depression Maternal Grandmother    Anxiety disorder Maternal Grandmother    Schizophrenia Maternal Grandfather    Anxiety disorder Paternal Grandmother    Depression Paternal Grandmother    Cancer Paternal Grandmother        stomach   Diabetes Paternal Grandmother    Anxiety disorder Paternal Grandfather    Depression Paternal Grandfather    Diabetes Paternal Grandfather    Heart disease Paternal Grandfather    COPD Other    Hyperlipidemia Other     Medications: Patient's Medications  New Prescriptions   No medications on file  Previous Medications   ALPRAZOLAM  (XANAX ) 1 MG TABLET    Take 1 tablet (1 mg total) by mouth 2 (two) times daily as needed for anxiety.   ASPIRIN  EC 81 MG TABLET    Take 1 tablet (81 mg total) by mouth daily. Swallow whole.   ATORVASTATIN (LIPITOR) 80 MG TABLET    Take 80 mg by mouth daily.   DIVALPROEX  (DEPAKOTE  ER) 500 MG 24 HR TABLET    TAKE THREE TABLETS BY MOUTH EVERY NIGHT AT BEDTIME   ESCITALOPRAM  (LEXAPRO ) 20 MG TABLET    Take 1 tablet (20 mg total) by mouth daily.   LOSARTAN  (COZAAR ) 50 MG TABLET    Take 50 mg by mouth daily.   METOPROLOL  SUCCINATE (TOPROL -XL) 25 MG 24 HR TABLET    TAKE ONE TABLET BY MOUTH DAILY   SYNTHROID  137 MCG TABLET    Take 137 mcg by mouth daily.   TRAMADOL  (ULTRAM ) 50 MG TABLET    Take 1 tablet (50 mg total) by mouth 2 (two) times daily as needed.   TRAZODONE  (DESYREL ) 100 MG TABLET    TAKE ONE TO TWO TABLETS BY MOUTH EVERY NIGHT AT BEDTIME AS NEEDED FOR SLEEP  Modified  Medications   No medications on file  Discontinued Medications   No medications on file    Physical Exam: Vitals:   10/23/23 1340  BP: 136/84  Pulse: 78  Resp: 14  Temp: 98.1 F (36.7 C)  SpO2: 94%  Weight: 209 lb 3.2 oz (94.9 kg)  Height: 5' 2 (1.575 m)   Body mass index is 38.26 kg/m. BP Readings from Last 3 Encounters:  10/23/23 136/84  07/17/23 132/84  07/15/23  132/78   Wt Readings from Last 3 Encounters:  10/23/23 209 lb 3.2 oz (94.9 kg)  07/17/23 207 lb (93.9 kg)  07/15/23 207 lb 6.4 oz (94.1 kg)    Physical Exam Constitutional:      Appearance: Normal appearance.  HENT:     Head: Normocephalic and atraumatic.  Cardiovascular:     Rate and Rhythm: Normal rate and regular rhythm.  Pulmonary:     Effort: Pulmonary effort is normal. No respiratory distress.     Breath sounds: Normal breath sounds. No wheezing.  Abdominal:     General: Bowel sounds are normal. There is no distension.     Tenderness: There is abdominal tenderness (lower abdominal pain). There is no guarding or rebound.     Comments:    Neurological:     Mental Status: She is alert. Mental status is at baseline.     Motor: No weakness.     Labs reviewed: Basic Metabolic Panel: Recent Labs    04/16/23 1531 07/15/23 1202  NA 138  --   K 3.6  --   CL 104  --   CO2 26  --   GLUCOSE 94  --   BUN 8  --   CREATININE 0.66  --   CALCIUM 8.7*  --   TSH  --  0.65   Liver Function Tests: Recent Labs    04/16/23 1531  AST 18  ALT 16  ALKPHOS 34*  BILITOT 0.9  PROT 6.2*  ALBUMIN 3.6   Recent Labs    04/16/23 1531  LIPASE 24   No results for input(s): AMMONIA in the last 8760 hours. CBC: Recent Labs    04/16/23 1531 07/15/23 1202  WBC 8.2 7.4  NEUTROABS  --  2,842  HGB 13.8 14.3  HCT 40.7 45.1*  MCV 88.1 92.8  PLT 226 252   Lipid Panel: Recent Labs    07/15/23 1202  CHOL 153  HDL 73  LDLCALC 66  TRIG 62  CHOLHDL 2.1   TSH: Recent Labs    07/15/23 1202   TSH 0.65   A1C: Lab Results  Component Value Date   HGBA1C 5.9 (H) 07/15/2023    Assessment and Plan Assessment & Plan  1. Dysuria (Primary)  Urine dipstick positive If no improvement in symptoms follow up in clinic in 1 week - ciprofloxacin  (CIPRO ) 500 MG tablet; Take 1 tablet (500 mg total) by mouth 2 (two) times daily for 5 days.  Dispense: 10 tablet; Refill: 0 - Culture, Urine  2. Lower abdominal pain Afebrile - CT ABDOMEN PELVIS W CONTRAST; Future - Basic Metabolic Panel - CBC (no diff)  3. Chronic bilateral low back pain without sciatica No red flag signs Take tylenol  650 mg q6 prn  Use lidocaine  patch, heating pad     No follow-ups on file.:   Hally Colella

## 2023-10-24 ENCOUNTER — Ambulatory Visit: Payer: Self-pay | Admitting: Sports Medicine

## 2023-10-24 LAB — CBC
HCT: 39.4 % (ref 35.0–45.0)
Hemoglobin: 13.2 g/dL (ref 11.7–15.5)
MCH: 30.3 pg (ref 27.0–33.0)
MCHC: 33.5 g/dL (ref 32.0–36.0)
MCV: 90.4 fL (ref 80.0–100.0)
MPV: 10.3 fL (ref 7.5–12.5)
Platelets: 235 Thousand/uL (ref 140–400)
RBC: 4.36 Million/uL (ref 3.80–5.10)
RDW: 12.6 % (ref 11.0–15.0)
WBC: 8.7 Thousand/uL (ref 3.8–10.8)

## 2023-10-24 LAB — BASIC METABOLIC PANEL WITH GFR
BUN: 16 mg/dL (ref 7–25)
CO2: 29 mmol/L (ref 20–32)
Calcium: 9.1 mg/dL (ref 8.6–10.4)
Chloride: 103 mmol/L (ref 98–110)
Creat: 0.65 mg/dL (ref 0.50–1.05)
Glucose, Bld: 89 mg/dL (ref 65–99)
Potassium: 3.6 mmol/L (ref 3.5–5.3)
Sodium: 141 mmol/L (ref 135–146)
eGFR: 98 mL/min/1.73m2 (ref >=60)

## 2023-10-24 LAB — URINE CULTURE
MICRO NUMBER:: 17104237
SPECIMEN QUALITY:: ADEQUATE

## 2023-10-24 NOTE — Telephone Encounter (Signed)
 Copied from CRM #[id number]. Topic: Clinical - Lab/Test Results >> Oct 24, 2023 12:18 PM Debby BROCKS wrote: Reason for CRM: Advised patient of her lab results being normal but she would still like to talk to a nurse about her urine sample

## 2023-10-24 NOTE — Telephone Encounter (Signed)
 Left a detail voicemail message in regards of concerns that the patient had about the urine sample that was collected on yesterday and to please call the office to go over labs results.

## 2023-10-29 ENCOUNTER — Ambulatory Visit: Payer: Self-pay

## 2023-10-29 NOTE — Telephone Encounter (Signed)
 Latoya Madden, MD to [facility]  (Selected Message)     10/29/23 11:44 AM Pt was prescribed cipro  at recent visit as urine dipstick showed trace LE , but culture grew very few bacteria not much you expect with a UTI. I cannot prescribe antibiotics if urine dipstick does not show evidence of UTI      Left message for patient to return call to office. If patient calls back please advise of notes from PCP and see if patient would like to be seen in clinic.   Please provide message from provider/office when call is returned from patient.

## 2023-10-29 NOTE — Telephone Encounter (Signed)
 FYI Only or Action Required?: FYI only for provider.  Patient was last seen in primary care on 10/23/2023 by Sherlynn Madden, MD.  Called Nurse Triage reporting Abdominal Pain.  Symptoms began a week ago.  Interventions attempted: Nothing.  Symptoms are: unchanged.  Triage Disposition: See Physician Within 24 Hours  Patient/caregiver understands and will follow disposition?: No, wishes to speak with PCP Patient reports that symptoms persist and she is sure that she has a UTI. Pt says that she doesn't feel the need to get a CT scan, and that she has not spoken to anyone about scheduling just yet. Pt expressed her frustration with her symptoms, she says she really feels she needs an abx. This RN review provider notes from 10/15 with patient that advised that no bacteria was growing in her urine. Pt says that her symptoms persist.  RN offered an appt today at 1:20 with same provider (Dr. Stana) however patient wants to know what the treatment will be as she would gabon come in for a visit IF she is getting an abx shot? Callback number confirmed: [phone] and pharmacy confirmed.   Copied from CRM #[id number]. Topic: Clinical - Red Word Triage >> Oct 29, 2023 10:41 AM Miquel SAILOR wrote: Red Word that prompted transfer to Nurse Triage: PT had visit on 10/15 for possible UTI. PT still in server pain and does not think CT is needed if she is in pain and was not given medication Reason for Disposition  [1] NEGATIVE urine test (i.e., NI - and LE - and WBC < 10) AND [2] urine symptoms continue or are getting worse  Answer Assessment - Initial Assessment Questions 1. TEST: What kind of urine test was performed? (e.g., urinalysis, urine dipstick)      Urinalysis dipstick and culture  2. URINALYSIS RESULT: Was it positive or negative?  If positive, document what was positive (e.g., LE, nitrites, WBC, RBC, bacteria, epithelial cells)     Negative  3. FEVER: Do you have a fever? If Yes,  ask: What is your temperature, how was it measured, and when did it start?     No  4. FLANK PAIN: Do you have any pain in your side?     Yes  5. OTHER SYMPTOMS: Do you have any other symptoms? (e.g., blood in urine, vomiting)     Pressure in bladder  6. PREGNANCY: Is there any chance you are pregnant? When was your last menstrual period?     No  7. PHENAZOPYRIDINE (Uristat, Pyridium): Have you taken phenazopyridine (turns your urine orange) recently?     No  Protocols used: Urinalysis Results Follow-up Call-A-AH

## 2023-10-30 NOTE — Telephone Encounter (Signed)
 Outgoing call placed to patient. I left a detailed voicemail with providers response and suggested patient return call if she has any additional questions, comments, or feedback.

## 2023-11-01 ENCOUNTER — Encounter: Admitting: Nurse Practitioner

## 2023-11-02 NOTE — Progress Notes (Signed)
 This encounter was created in error - please disregard.

## 2023-11-04 ENCOUNTER — Other Ambulatory Visit: Payer: Self-pay | Admitting: Nurse Practitioner

## 2023-11-04 MED ORDER — ATORVASTATIN CALCIUM 80 MG PO TABS: 80.0000 mg | ORAL_TABLET | Freq: Every day | ORAL | 3 refills | Status: AC

## 2023-11-04 NOTE — Telephone Encounter (Signed)
 Copied from CRM [id number]. Topic: Clinical - Medication Refill >> Nov 04, 2023 10:41 AM Debby BROCKS wrote: Medication: atorvastatin (LIPITOR) 80 MG tablet  Has the patient contacted their pharmacy? Yes (Agent: If no, request that the patient contact the pharmacy for the refill. If patient does not wish to contact the pharmacy document the reason why and proceed with request.) (Agent: If yes, when and what did the pharmacy advise?)  This is the patient's preferred pharmacy:  [facility] [id number] - [place] - [address] [place] [address] Phone: [phone] Fax: [phone]  Is this the correct pharmacy for this prescription? Yes If no, delete pharmacy and type the correct one.   Has the prescription been filled recently? No  Is the patient out of the medication? No  Has the patient been seen for an appointment in the last year OR does the patient have an upcoming appointment? Yes  Can we respond through MyChart? Yes  Agent: Please be advised that Rx refills may take up to 3 business days. We ask that you follow-up with your pharmacy.

## 2023-11-05 ENCOUNTER — Encounter: Payer: Self-pay | Admitting: Nurse Practitioner

## 2023-11-05 ENCOUNTER — Ambulatory Visit
Admission: RE | Admit: 2023-11-05 | Discharge: 2023-11-05 | Disposition: A | Discharge status: Home / Self Care | Source: Ambulatory Visit | Attending: Sports Medicine | Admitting: Sports Medicine

## 2023-11-05 DIAGNOSIS — K5730 Diverticulosis of large intestine without perforation or abscess without bleeding: Secondary | ICD-10-CM | POA: Diagnosis not present

## 2023-11-05 DIAGNOSIS — R1030 Lower abdominal pain, unspecified: Secondary | ICD-10-CM

## 2023-11-05 MED ORDER — IOPAMIDOL (ISOVUE-300) INJECTION 61%
100.0000 mL | Freq: Once | INTRAVENOUS | Status: AC | PRN
Start: 2023-11-05 — End: 2023-11-05
  Administered 2023-11-05: 100 mL via INTRAVENOUS

## 2023-11-06 NOTE — Telephone Encounter (Signed)
 IMPRESSION: 1. No acute findings in the abdomen/pelvis. 2. Colonic diverticulosis without acute inflammation. 3. Aortic atherosclerosis. Atherosclerotic coronary artery disease.

## 2023-11-07 ENCOUNTER — Other Ambulatory Visit: Payer: Self-pay | Admitting: Nurse Practitioner

## 2023-11-07 DIAGNOSIS — M150 Primary generalized (osteo)arthritis: Secondary | ICD-10-CM

## 2023-11-07 NOTE — Telephone Encounter (Unsigned)
 Copied from CRM [id number]. Topic: Clinical - Medication Refill >> Nov 07, 2023  3:49 PM Cherylann S wrote: Medication: traMADol  (ULTRAM ) 50 MG tablet  Has the patient contacted their pharmacy? No (Agent: If no, request that the patient contact the pharmacy for the refill. If patient does not wish to contact the pharmacy document the reason why and proceed with request.) (Agent: If yes, when and what did the pharmacy advise?)  This is the patient's preferred pharmacy:  [facility] [id number] - [place], [place] - [address] [place] [address] Phone: [phone] Fax: [phone]  Is this the correct pharmacy for this prescription? Yes If no, delete pharmacy and type the correct one.   Has the prescription been filled recently? No  Is the patient out of the medication? Yes  Has the patient been seen for an appointment in the last year OR does the patient have an upcoming appointment? Yes  Can we respond through MyChart? No  Agent: Please be advised that Rx refills may take up to 3 business days. We ask that you follow-up with your pharmacy.

## 2023-11-08 ENCOUNTER — Ambulatory Visit: Payer: Self-pay

## 2023-11-08 NOTE — Telephone Encounter (Signed)
 Needs a follow up appt scheduled prior to refill being approve- please look to see when follow up was recommended

## 2023-11-08 NOTE — Telephone Encounter (Signed)
 Pharmacy Requested refill. Epic LR: 10/08/2023 Contract Date: 10/30/2023  Pended Rx and sent to [facility] for approval.

## 2023-11-08 NOTE — Telephone Encounter (Signed)
 Patient has request for refill on 11/08/2023. Patient last refill was 10/08/23. Patient has contract on file dated 10/30/23. Patient has no upcoming appointment .  Medication pend and sent to PCP Arch, Jessica K, NP) for approval.

## 2023-11-08 NOTE — Telephone Encounter (Signed)
 She needs follow up appt prior to refill

## 2023-11-08 NOTE — Telephone Encounter (Signed)
 Pt has no upcoming appt and this is why medication was not refill- needs to make sure to schedule follow up

## 2023-11-08 NOTE — Telephone Encounter (Signed)
 Left message for patient to return call to office. In order for an refill to occur for the tramadol  the patient must schedule an appointment. If patient calls back please schedule her an appointment and route message back to the [facility] clinical pool.

## 2023-11-08 NOTE — Telephone Encounter (Signed)
 Noted

## 2023-11-08 NOTE — Telephone Encounter (Signed)
 Closing encounter issue is being addressed in another encounter

## 2023-11-08 NOTE — Telephone Encounter (Signed)
 Patient wanting to know update on medication. Medication was sent by [facility] CMA yesterday with controlled substance documentation.

## 2023-11-08 NOTE — Telephone Encounter (Signed)
 Contacted patient regarding medication refill. Unable to get an answer left patient a detailed voicemail.   If patient calls back : [id number] can schedule patient appointment when Latoya Harlene POUR, NP is available and inform patient that she missed her appointment on 11/01/2023 for a routine follow up visit.

## 2023-11-08 NOTE — Telephone Encounter (Signed)
 FYI Only or Action Required?: Action required by provider: medication refill request.  Patient was last seen in primary care on 10/23/2023 by Sherlynn Madden, MD.  Called Nurse Triage reporting Medication Problem.   Triage Disposition: Call PCP When Office is Open  Patient/caregiver understands and will follow disposition?: Yes        Copied from CRM [id number]. Topic: Clinical - Red Word Triage >> Nov 08, 2023  9:39 AM Mercer PEDLAR wrote: Red Word that prompted transfer to Nurse Triage: Patient stated that she is having severe back pain due to not having traMADol  (ULTRAM ) 50 MG tablet. Patient called for medication refill yesterday 11/07/23. Reason for Disposition  [1] Caller has NON-URGENT medicine question about med that doctor (or NP/PA) prescribed AND [2] triager unable to answer question  Answer Assessment - Initial Assessment Questions 1. DRUG NAME: What medicine do you need to have refilled?     Tramadol  50 mg  2. PRESCRIBER: Who prescribed it? Note: The prescribing doctor or group is responsible for refill approvals.SABRA     PCP 3. SYMPTOMS: Do you have any symptoms?     Back pain    Last pill taken this morning. Requesting for medication to be refilled today.  Protocols used: Medication Refill and Renewal Call-A-AH

## 2023-11-11 ENCOUNTER — Telehealth: Payer: Self-pay

## 2023-11-11 ENCOUNTER — Ambulatory Visit: Payer: Self-pay

## 2023-11-11 NOTE — Telephone Encounter (Signed)
 Thank you :)

## 2023-11-11 NOTE — Telephone Encounter (Signed)
 Copied from CRM [id number]. Topic: Clinical - Red Word Triage >> Nov 11, 2023 10:27 AM Zane F wrote: Red Word that prompted transfer to Nurse Triage:   Concern: pain and pressure in pelvic region ( patient disconnected call abruptly before transfer; Please call patient back)  Callback Number: [phone]   Symptoms: High frequency urination  When did the symptoms start?: months ago  What have you done to aid in the concern ? Have you taken anything to assist with the matter?: No  If so, what did you take?: Patient stated that she was allergic to the antibiotic prescribed previously but did not reach out to replace the prescription  Wanted to let you know I will be transferring you to further discuss your concern. Please be advised the nurse can assist with scheduling.

## 2023-11-11 NOTE — Telephone Encounter (Signed)
 This RN attempted to contact patient. No answer.

## 2023-11-11 NOTE — Telephone Encounter (Signed)
 Call disconnected on transfer from PAS. This RN attempted to call pt back, no answer. Unable to leave voicemail due mailbox being full.

## 2023-11-11 NOTE — Telephone Encounter (Signed)
 This RN attempted to contact patient. No answer. Will place in call back. Extra note stating patient will be leaving [facility] and going back to old office

## 2023-11-11 NOTE — Telephone Encounter (Signed)
 Copied from CRM [id number]. Topic: General - Other >> Nov 11, 2023 11:02 AM Latoya Clark wrote: Reason for CRM: Patient is calling in to inform the office that she will no longer be a patient and will be returning to her previous doctor.

## 2023-11-11 NOTE — Telephone Encounter (Signed)
 Outgoing call placed to patient, no answer, and voicemail full. We will need to make additional attempts to reach patient.

## 2023-11-11 NOTE — Telephone Encounter (Signed)
 No UTI noted on last culture, would recommend being seen due to having an acute issue

## 2023-11-11 NOTE — Telephone Encounter (Signed)
 Patient has decided to change PCP's, however I will send to Harlene An, NP to review in the meantime until patient is able to return to her old PCP as stated in a previous message

## 2023-11-12 DIAGNOSIS — J439 Emphysema, unspecified: Secondary | ICD-10-CM | POA: Diagnosis not present

## 2023-11-12 DIAGNOSIS — M797 Fibromyalgia: Secondary | ICD-10-CM | POA: Diagnosis not present

## 2023-11-12 DIAGNOSIS — M25511 Pain in right shoulder: Secondary | ICD-10-CM | POA: Diagnosis not present

## 2023-11-12 DIAGNOSIS — I10 Essential (primary) hypertension: Secondary | ICD-10-CM | POA: Diagnosis not present

## 2023-11-12 DIAGNOSIS — N3289 Other specified disorders of bladder: Secondary | ICD-10-CM | POA: Diagnosis not present

## 2023-11-12 DIAGNOSIS — N3941 Urge incontinence: Secondary | ICD-10-CM | POA: Diagnosis not present

## 2023-11-12 DIAGNOSIS — F331 Major depressive disorder, recurrent, moderate: Secondary | ICD-10-CM | POA: Diagnosis not present

## 2023-11-12 DIAGNOSIS — G8929 Other chronic pain: Secondary | ICD-10-CM | POA: Diagnosis not present

## 2023-11-12 DIAGNOSIS — M519 Unspecified thoracic, thoracolumbar and lumbosacral intervertebral disc disorder: Secondary | ICD-10-CM | POA: Diagnosis not present

## 2023-11-12 DIAGNOSIS — E039 Hypothyroidism, unspecified: Secondary | ICD-10-CM | POA: Diagnosis not present

## 2023-11-12 DIAGNOSIS — R7303 Prediabetes: Secondary | ICD-10-CM | POA: Diagnosis not present

## 2023-11-12 NOTE — Telephone Encounter (Signed)
 Another outgoing call placed to patient, no answer, and voicemail full. We will need to make additional attempts to reach patient.

## 2023-11-13 ENCOUNTER — Telehealth: Payer: Self-pay | Admitting: Physician Assistant

## 2023-11-13 NOTE — Telephone Encounter (Signed)
 Left message on voicemail for patient to return call when available . Maximum attempts to reach patient met, additional communication to be prompted by patient

## 2023-11-13 NOTE — Telephone Encounter (Signed)
 States her husband is dying. On hospice.  She went to make funeral plans yesterday and last night she had multiple panic attacks.  The Xanax  helped but was not enough. Is overwhelmed. No SI. Advised her to increase Xanax  1 mg from bid prn to tid prn.  If needed, she can get an early RF. Talk w/ social worker and hospice chaplain to help her through this. Go to [facility] if worse.

## 2023-11-15 DIAGNOSIS — M545 Low back pain, unspecified: Secondary | ICD-10-CM | POA: Diagnosis not present

## 2023-11-15 DIAGNOSIS — M5416 Radiculopathy, lumbar region: Secondary | ICD-10-CM | POA: Diagnosis not present

## 2023-11-15 DIAGNOSIS — M51362 Other intervertebral disc degeneration, lumbar region with discogenic back pain and lower extremity pain: Secondary | ICD-10-CM | POA: Diagnosis not present

## 2023-11-15 DIAGNOSIS — R29898 Other symptoms and signs involving the musculoskeletal system: Secondary | ICD-10-CM | POA: Diagnosis not present

## 2023-11-18 ENCOUNTER — Ambulatory Visit (INDEPENDENT_AMBULATORY_CARE_PROVIDER_SITE_OTHER): Admitting: Physician Assistant

## 2023-11-18 ENCOUNTER — Encounter: Payer: Self-pay | Admitting: Physician Assistant

## 2023-11-18 DIAGNOSIS — G4700 Insomnia, unspecified: Secondary | ICD-10-CM

## 2023-11-18 DIAGNOSIS — F411 Generalized anxiety disorder: Secondary | ICD-10-CM

## 2023-11-18 DIAGNOSIS — F319 Bipolar disorder, unspecified: Secondary | ICD-10-CM | POA: Diagnosis not present

## 2023-11-18 DIAGNOSIS — F4320 Adjustment disorder, unspecified: Secondary | ICD-10-CM | POA: Diagnosis not present

## 2023-11-18 MED ORDER — ALPRAZOLAM 1 MG PO TABS: 1.0000 mg | ORAL_TABLET | Freq: Three times a day (TID) | ORAL | 1 refills | Status: AC | PRN

## 2023-11-18 NOTE — Progress Notes (Signed)
 [facility]  Patient ID: Latoya Clark,  MRN: [id number]  PCP: No primary care provider on file.  Date of Evaluation:  11/18/2023 Time spent:25 minutes  Chief Complaint:  Chief Complaint   Anxiety; Depression; Insomnia; Follow-up    HISTORY/CURRENT STATUS: For routine med check  Husband is on Hospice.  She's very sad of course.  Doesn't have any help with her husband.  No family or friends nearby.  She doesn't want him to die at home but [facility] doesn't have a bed.  We increased the Xanax  up to tid prn last week and that has been helpful.  She was feeling more panicky but now is more overwhelmed than panicky. More sadness in her life is a cousin died this morning and an uncle died last week. It's a lot.  Hard to say how well her meds are working under these circumstances.  ADLs and personal hygiene are normal but she's sluggish from the stress and not sleeping as well as she'd like.   Denies any changes in concentration, making decisions, or remembering things.  Appetite has not changed.   No SI/HI.  No reports of increased energy with decreased need for sleep, increased talkativeness, racing thoughts, impulsivity or risky behaviors, increased spending, increased libido, grandiosity, increased irritability or anger, paranoia, or hallucinations.  Individual Medical History/ Review of Systems: Changes? :No   Past medications for mental health diagnoses include: VPA, Lexapro , Wellbutrin , Traz, Klonopin , Zoloft, Prozac, Ativan, Xanax , Lamictal  caused a rash.  Allergies: Lamictal  [lamotrigine ], Cymbalta  [duloxetine  hcl], Lisinopril, Morphine  and codeine, Penicillin g, Prednisone , and Codeine  Current Medications:  Current Outpatient Medications:    aspirin  EC 81 MG tablet, Take 1 tablet (81 mg total) by mouth daily. Swallow whole., Disp: , Rfl:    atorvastatin (LIPITOR) 80 MG tablet, Take 1 tablet (80 mg total) by mouth daily., Disp: 30 tablet, Rfl: 3   divalproex   (DEPAKOTE  ER) 500 MG 24 hr tablet, TAKE THREE TABLETS BY MOUTH EVERY NIGHT AT BEDTIME, Disp: 270 tablet, Rfl: 1   escitalopram  (LEXAPRO ) 20 MG tablet, Take 1 tablet (20 mg total) by mouth daily., Disp: 90 tablet, Rfl: 1   metoprolol  succinate (TOPROL -XL) 25 MG 24 hr tablet, TAKE ONE TABLET BY MOUTH DAILY, Disp: , Rfl:    SYNTHROID  137 MCG tablet, Take 137 mcg by mouth daily., Disp: , Rfl: 6   traMADol  (ULTRAM ) 50 MG tablet, Take 1 tablet (50 mg total) by mouth 2 (two) times daily as needed., Disp: 60 tablet, Rfl: 0   traZODone  (DESYREL ) 100 MG tablet, TAKE ONE TO TWO TABLETS BY MOUTH EVERY NIGHT AT BEDTIME AS NEEDED FOR SLEEP, Disp: 180 tablet, Rfl: 1   ALPRAZolam  (XANAX ) 1 MG tablet, Take 1 tablet (1 mg total) by mouth 3 (three) times daily as needed for anxiety., Disp: 90 tablet, Rfl: 1 Medication Side Effects: diarrhea possibly d/t Cymbalta   Family Medical/ Social History: Changes? See HPI  MENTAL HEALTH EXAM:  There were no vitals taken for this visit.There is no height or weight on file to calculate BMI.  General Appearance: Casual, Well Groomed, and Obese  Eye Contact:  Fair  Speech:  Normal Rate  Volume:  Normal  Mood:  sad  Affect:  Congruent  Thought Process:  Goal Directed and Descriptions of Associations: Intact  Orientation:  Full (Time, Place, and Person)  Thought Content: Logical   Suicidal Thoughts:  No  Homicidal Thoughts:  No  Memory:  WNL  Judgement:  Good  Insight:  Good  Psychomotor Activity:  Normal  Concentration:  Concentration: Good and Attention Span: Good  Recall:  Good  Fund of Knowledge: Good  Language: Good  Assets:  Communication Skills Desire for Improvement Financial Resources/Insurance Housing Resilience Transportation  ADL's:  Intact  Cognition: WNL  Prognosis:  Good   Labs 11/12/2023.   Depakote  level 41 from [facility] at [place]  States she was not taking the Depakote  as directed.  DIAGNOSES:    ICD-10-CM   1. Bipolar I disorder  (HCC)  F31.9     2. Generalized anxiety disorder  F41.1     3. Insomnia, unspecified type  G47.00     4. Anticipatory grief  F43.20       Receiving Psychotherapy: Yes  Marval Bunde, LCSW   RECOMMENDATIONS:  PDMP was reviewed.  Last Xanax  filled 11/07/2023. I provided approximately  25 minutes of face to face time during this encounter, including time spent before and after the visit in records review, medical decision making, counseling pertinent to today's visit, and charting.   I am sorry to hear about her husband.  Continue Xanax  1 mg, 1 3 times daily as needed.  Try not to take it 3 times a day every day if possible.  Tolerance discussed.  Continue Lexapro  20 mg, 1 p.o. daily. Continue Depakote  ER 500 mg, 3 p.o. nightly.  Continue trazodone  100 mg, 1-2 nightly as needed sleep. Restart therapy Marval Bunde, LCSW. Return in 2 months.    Verneita Cooks, PA-C

## 2023-11-28 DIAGNOSIS — M48061 Spinal stenosis, lumbar region without neurogenic claudication: Secondary | ICD-10-CM | POA: Diagnosis not present

## 2023-11-28 DIAGNOSIS — M47816 Spondylosis without myelopathy or radiculopathy, lumbar region: Secondary | ICD-10-CM | POA: Diagnosis not present

## 2023-11-28 DIAGNOSIS — M47817 Spondylosis without myelopathy or radiculopathy, lumbosacral region: Secondary | ICD-10-CM | POA: Diagnosis not present

## 2023-11-28 DIAGNOSIS — M5136 Other intervertebral disc degeneration, lumbar region with discogenic back pain only: Secondary | ICD-10-CM | POA: Diagnosis not present

## 2023-11-28 DIAGNOSIS — M4316 Spondylolisthesis, lumbar region: Secondary | ICD-10-CM | POA: Diagnosis not present

## 2023-11-28 DIAGNOSIS — R936 Abnormal findings on diagnostic imaging of limbs: Secondary | ICD-10-CM | POA: Diagnosis not present

## 2024-01-20 ENCOUNTER — Encounter: Payer: Self-pay | Admitting: Physician Assistant

## 2024-01-20 ENCOUNTER — Ambulatory Visit: Admitting: Physician Assistant

## 2024-01-20 DIAGNOSIS — F4323 Adjustment disorder with mixed anxiety and depressed mood: Secondary | ICD-10-CM

## 2024-01-20 DIAGNOSIS — G4700 Insomnia, unspecified: Secondary | ICD-10-CM

## 2024-01-20 DIAGNOSIS — Z6379 Other stressful life events affecting family and household: Secondary | ICD-10-CM

## 2024-01-20 DIAGNOSIS — F319 Bipolar disorder, unspecified: Secondary | ICD-10-CM | POA: Diagnosis not present

## 2024-01-20 DIAGNOSIS — F4320 Adjustment disorder, unspecified: Secondary | ICD-10-CM | POA: Diagnosis not present

## 2024-01-20 NOTE — Progress Notes (Signed)
 "     [facility]  Patient ID: Latoya Clark,  MRN: [id number]  PCP: No primary care provider on file.  Date of Evaluation:  01/20/2024 Time spent:20 minutes  Chief Complaint:  Chief Complaint   Anxiety; Depression; Follow-up    HISTORY/CURRENT STATUS: For routine med check  Her husband is still living.  He's been on hospice for 3 months or so. I'm ready.  I can't stand to see him like this. He had pneumonia over Christmas and was hospitalized. Now he's been dx w/ lung and bladder cancer. I can't go anywhere or do anything. I'm so over it.  States she's really depressed..  Energy and motivation are fair.  Personal hygiene is normal.   Denies any changes in concentration, making decisions, or remembering things.  Appetite has not changed.  Anxiety is controlled with the Xanax .   No mania, delirium, AH/VH.  No SI/HI.  Individual Medical History/ Review of Systems: Changes? :No   Past medications for mental health diagnoses include: VPA, Lexapro , Wellbutrin , Traz, Klonopin , Zoloft, Prozac, Ativan, Xanax , Lamictal  caused a rash.  Allergies: Lamictal  [lamotrigine ], Cymbalta  [duloxetine  hcl], Lisinopril, Morphine  and codeine, Penicillin g, Prednisone , and Codeine  Current Medications:  Current Outpatient Medications:    ALPRAZolam  (XANAX ) 1 MG tablet, Take 1 tablet (1 mg total) by mouth 3 (three) times daily as needed for anxiety., Disp: 90 tablet, Rfl: 1   aspirin  EC 81 MG tablet, Take 1 tablet (81 mg total) by mouth daily. Swallow whole., Disp: , Rfl:    atorvastatin  (LIPITOR) 80 MG tablet, Take 1 tablet (80 mg total) by mouth daily., Disp: 30 tablet, Rfl: 3   divalproex  (DEPAKOTE  ER) 500 MG 24 hr tablet, TAKE THREE TABLETS BY MOUTH EVERY NIGHT AT BEDTIME, Disp: 270 tablet, Rfl: 1   escitalopram  (LEXAPRO ) 20 MG tablet, Take 1 tablet (20 mg total) by mouth daily., Disp: 90 tablet, Rfl: 1   metoprolol  succinate (TOPROL -XL) 25 MG 24 hr tablet, TAKE ONE TABLET BY MOUTH DAILY,  Disp: , Rfl:    SYNTHROID  137 MCG tablet, Take 137 mcg by mouth daily., Disp: , Rfl: 6   traMADol  (ULTRAM ) 50 MG tablet, Take 1 tablet (50 mg total) by mouth 2 (two) times daily as needed., Disp: 60 tablet, Rfl: 0   traZODone  (DESYREL ) 100 MG tablet, TAKE ONE TO TWO TABLETS BY MOUTH EVERY NIGHT AT BEDTIME AS NEEDED FOR SLEEP, Disp: 180 tablet, Rfl: 1 Medication Side Effects: diarrhea possibly d/t Cymbalta   Family Medical/ Social History: Changes? An uncle and 2 cousins died over the holidays.   MENTAL HEALTH EXAM:  There were no vitals taken for this visit.There is no height or weight on file to calculate BMI.  General Appearance: Casual, Well Groomed, and Obese  Eye Contact:  Good  Speech:  Clear and Coherent and Normal Rate  Volume:  Normal  Mood:  sad  Affect:  Congruent  Thought Process:  Goal Directed and Descriptions of Associations: Intact  Orientation:  Full (Time, Place, and Person)  Thought Content: Logical   Suicidal Thoughts:  No  Homicidal Thoughts:  No  Memory:  WNL  Judgement:  Good  Insight:  Good  Psychomotor Activity:  walks with a cane  Concentration:  Concentration: Good and Attention Span: Good  Recall:  Good  Fund of Knowledge: Good  Language: Good  Assets:  Communication Skills Desire for Improvement Financial Resources/Insurance Housing Resilience Transportation  ADL's:  Intact  Cognition: WNL  Prognosis:  Good  DIAGNOSES:    ICD-10-CM   1. Situational mixed anxiety and depressive disorder  F43.23     2. Bipolar I disorder (HCC)  F31.9     3. Insomnia, unspecified type  G47.00     4. Anticipatory grief  F43.20     5. Stress due to illness of family member  Z63.79      Receiving Psychotherapy: No  Marval Bunde, LCSW in the past.   RECOMMENDATIONS:  PDMP was reviewed.  Last Xanax  filled 01/15/2024.  Tramadol  filled 01/15/2024. I provided approximately 20 minutes of face to face time during this encounter, including time spent before and  after the visit in records review, medical decision making, counseling pertinent to today's visit, and charting.   She's stable. We talked about the anticipatory grief and sadness that comes with it. Changing meds won't change her circumstances and she understands that.  However, she's been on the Lexapro  at this dose for 2 + years and it may be helpful to increase to 30 mg daily. At this point, we agree for her to continue the same  tx and if needed for either anxiety or depression, we'll increase it.   Continue Xanax  1 mg, 1 3 times daily as needed.  Try not to take it 3 times a day every day if possible.  Tolerance discussed.  Continue Lexapro  20 mg, 1 p.o. daily. Continue Depakote  ER 500 mg, 3 p.o. nightly.  Continue trazodone  100 mg, 1-2 nightly as needed sleep. Strongly encouraged her to restart therapy Marval Bunde, LCSW. Return in 3 months.    Verneita Cooks, PA-C  "

## 2024-04-20 ENCOUNTER — Ambulatory Visit: Admitting: Physician Assistant

## 2024-07-17 ENCOUNTER — Ambulatory Visit: Payer: Self-pay | Admitting: Nurse Practitioner
